# Patient Record
Sex: Male | Born: 1960 | Race: Black or African American | Hispanic: No | Marital: Married | Smoking: Never smoker
Health system: Southern US, Community
[De-identification: ages and names within clinical notes are randomized; demographics above are authoritative.]

## PROBLEM LIST (undated history)

## (undated) DIAGNOSIS — E7800 Pure hypercholesterolemia, unspecified: Secondary | ICD-10-CM

## (undated) DIAGNOSIS — E119 Type 2 diabetes mellitus without complications: Secondary | ICD-10-CM

## (undated) DIAGNOSIS — F319 Bipolar disorder, unspecified: Secondary | ICD-10-CM

## (undated) DIAGNOSIS — H409 Unspecified glaucoma: Secondary | ICD-10-CM

## (undated) DIAGNOSIS — I10 Essential (primary) hypertension: Secondary | ICD-10-CM

## (undated) DIAGNOSIS — C61 Malignant neoplasm of prostate: Secondary | ICD-10-CM

## (undated) HISTORY — PX: PROSTATE BIOPSY: SHX241

---

## 2007-05-29 ENCOUNTER — Emergency Department (HOSPITAL_COMMUNITY): Admission: EM | Admit: 2007-05-29 | Discharge: 2007-05-29 | Payer: Self-pay | Admitting: Emergency Medicine

## 2008-10-09 ENCOUNTER — Inpatient Hospital Stay: Payer: Self-pay | Admitting: Unknown Physician Specialty

## 2012-01-16 ENCOUNTER — Emergency Department (HOSPITAL_COMMUNITY)
Admission: EM | Admit: 2012-01-16 | Discharge: 2012-01-17 | Disposition: A | Discharge status: Home / Self Care | Payer: Medicare Other | Attending: Emergency Medicine | Admitting: Emergency Medicine

## 2012-01-16 ENCOUNTER — Encounter (HOSPITAL_COMMUNITY): Payer: Self-pay

## 2012-01-16 DIAGNOSIS — L251 Unspecified contact dermatitis due to drugs in contact with skin: Secondary | ICD-10-CM

## 2012-01-16 DIAGNOSIS — T498X5A Adverse effect of other topical agents, initial encounter: Secondary | ICD-10-CM | POA: Insufficient documentation

## 2012-01-16 DIAGNOSIS — T7840XA Allergy, unspecified, initial encounter: Secondary | ICD-10-CM | POA: Insufficient documentation

## 2012-01-16 DIAGNOSIS — W57XXXA Bitten or stung by nonvenomous insect and other nonvenomous arthropods, initial encounter: Secondary | ICD-10-CM

## 2012-01-16 NOTE — ED Notes (Signed)
Pt has facial swelling.

## 2012-01-16 NOTE — ED Notes (Addendum)
Pt sts 2 weeks ago went to Dr for sore on R hand and bilateral forearm bumps.  Pt was given Clotrimazole cream.  Pt sts he touched face and behind R ear with cream on hands and now is having facial swelling.  Pt denies SOB or difficulty breathing.

## 2012-01-17 MED ORDER — DIPHENHYDRAMINE HCL 50 MG/ML IJ SOLN
50.0000 mg | Freq: Once | INTRAMUSCULAR | Status: AC
  Administered 2012-01-17: 50 mg via INTRAVENOUS
  Filled 2012-01-17: qty 1

## 2012-01-17 MED ORDER — PREDNISONE 10 MG PO TABS: 20.0000 mg | ORAL_TABLET | Freq: Every day | ORAL | Status: DC

## 2012-01-17 MED ORDER — METHYLPREDNISOLONE SODIUM SUCC 125 MG IJ SOLR
125.0000 mg | Freq: Once | INTRAMUSCULAR | Status: AC
  Administered 2012-01-17: 125 mg via INTRAVENOUS
  Filled 2012-01-17: qty 2

## 2012-01-17 MED ORDER — FAMOTIDINE IN NACL 20-0.9 MG/50ML-% IV SOLN
20.0000 mg | INTRAVENOUS | Status: AC
  Administered 2012-01-17: 20 mg via INTRAVENOUS
  Filled 2012-01-17: qty 50

## 2012-01-17 NOTE — ED Notes (Signed)
Pt sts face has begun to "tingle and throb."  Pt given an ice pack.

## 2012-01-17 NOTE — ED Provider Notes (Signed)
History     CSN: [id number]  Arrival date & time 01/16/12  2328   First MD Initiated Contact with Patient 01/16/12 2338      Chief Complaint  Patient presents with  . Facial Swelling  . Allergic Reaction    (Consider location/radiation/quality/duration/timing/severity/associated sxs/prior treatment) HPI  Scott Mcgee is a 51 y.o. male who presents to the Emergency Department complaining of rash to body and itching that began after being placed on steroid cream and antibiotics for an insect bite to the top of his right hand. He rubbed the steroid cream on his arms, neck, face. Now with diffuse rash, patchy areas of weeping and extreme itching. No change to the hand lesion. Denies fever, chills, nausea, shortness of breath.     PCP [facility]   History reviewed. No pertinent past medical history.  History reviewed. No pertinent past surgical history.  History reviewed. No pertinent family history.  History  Substance Use Topics  . Smoking status: Never Smoker   . Smokeless tobacco: Never Used  . Alcohol Use: No      Review of Systems  Constitutional: Negative for fever.       10 Systems reviewed and are negative for acute change except as noted in the HPI.  HENT: Negative for congestion.   Eyes: Negative for discharge and redness.  Respiratory: Negative for cough and shortness of breath.   Cardiovascular: Negative for chest pain.  Gastrointestinal: Negative for vomiting and abdominal pain.  Musculoskeletal: Negative for back pain.  Skin: Positive for rash.       Itching, lesion to right hand  Neurological: Negative for syncope, numbness and headaches.  Psychiatric/Behavioral:       No behavior change.    Allergies  Review of patient's allergies indicates not on file.  Home Medications   Current Outpatient Rx  Name Route Sig Dispense Refill  . CEPHALEXIN 500 MG PO CAPS Oral Take 500 mg by mouth 3 (three) times daily.    Marland Kitchen CLOTRIMAZOLE 1 % EX CREA  Topical Apply topically 2 (two) times daily.    Marland Kitchen PREDNISONE 10 MG PO TABS Oral Take 2 tablets (20 mg total) by mouth daily. 10 tablet 0    BP 148/87  Pulse 68  Temp 98.2 F (36.8 C) (Oral)  Ht 5\' 6"  (1.676 m)  Wt 165 lb (74.844 kg)  BMI 26.63 kg/m2  SpO2 98%  Physical Exam  Nursing note and vitals reviewed. Constitutional: He appears well-developed and well-nourished.       Awake, alert, nontoxic appearance.  HENT:  Head: Atraumatic.  Eyes: Conjunctivae normal and EOM are normal. Pupils are equal, round, and reactive to light. Right eye exhibits no discharge. Left eye exhibits no discharge.  Neck: Normal range of motion. Neck supple.  Cardiovascular: Normal heart sounds.   Pulmonary/Chest: Effort normal and breath sounds normal. He exhibits no tenderness.  Abdominal: Soft. Bowel sounds are normal. There is no tenderness. There is no rebound.  Musculoskeletal: He exhibits no tenderness.       Baseline ROM, no obvious new focal weakness.  Neurological:       Mental status and motor strength appears baseline for patient and situation.  Skin: No rash noted.       Extensive erythematous rash to arms, neck, face, with areas of weeping , crusting, peeling. C/w contact dermatitis in various stages. Dorsum of right hand with a 2 cm circular lesion that has hyperpigmentation changes and necrotic center c/w spider bite. Mild  surrounding erythema  Psychiatric: He has a normal mood and affect.    ED Course  Procedures (including critical care time)  Labs Reviewed - No data to display No results found.   1. Allergic reaction       MDM  Patient with rash that developed after starting treatment with a steroid cream and antibiotic for an insect bite.Now with a reaction to the cream manifesting as a contact dermatitis with blisters, weeping, peeling and erythema. Initiated PO steroids, pepcid and benadryl. Referral to dermatology. Pt stable in ED with no significant deterioration in  condition.The patient appears reasonably screened and/or stabilized for discharge and I doubt any other medical condition or other [facility] requiring further screening, evaluation, or treatment in the ED at this time prior to discharge.  MDM Reviewed: nursing note and vitals           Gypsy Balsam. Olin Hauser, MD 01/17/12 [phone]

## 2012-01-22 ENCOUNTER — Other Ambulatory Visit (HOSPITAL_COMMUNITY): Payer: Self-pay | Admitting: Family Medicine

## 2012-01-22 DIAGNOSIS — IMO0002 Reserved for concepts with insufficient information to code with codable children: Secondary | ICD-10-CM

## 2012-01-26 ENCOUNTER — Ambulatory Visit (HOSPITAL_COMMUNITY)
Admission: RE | Admit: 2012-01-26 | Discharge: 2012-01-26 | Disposition: A | Discharge status: Home / Self Care | Payer: Medicare Other | Source: Ambulatory Visit | Attending: Family Medicine | Admitting: Family Medicine

## 2012-01-26 DIAGNOSIS — R748 Abnormal levels of other serum enzymes: Secondary | ICD-10-CM | POA: Insufficient documentation

## 2012-01-26 DIAGNOSIS — R935 Abnormal findings on diagnostic imaging of other abdominal regions, including retroperitoneum: Secondary | ICD-10-CM | POA: Insufficient documentation

## 2012-01-26 DIAGNOSIS — IMO0002 Reserved for concepts with insufficient information to code with codable children: Secondary | ICD-10-CM

## 2015-12-29 ENCOUNTER — Encounter (HOSPITAL_COMMUNITY): Payer: Self-pay | Admitting: Emergency Medicine

## 2015-12-29 ENCOUNTER — Emergency Department (HOSPITAL_COMMUNITY)
Admission: EM | Admit: 2015-12-29 | Discharge: 2015-12-29 | Disposition: A | Discharge status: Home / Self Care | Payer: Medicare (Managed Care) | Attending: Emergency Medicine | Admitting: Emergency Medicine

## 2015-12-29 ENCOUNTER — Emergency Department (HOSPITAL_COMMUNITY): Payer: Medicare (Managed Care)

## 2015-12-29 DIAGNOSIS — R4182 Altered mental status, unspecified: Secondary | ICD-10-CM | POA: Insufficient documentation

## 2015-12-29 DIAGNOSIS — W07XXXA Fall from chair, initial encounter: Secondary | ICD-10-CM | POA: Diagnosis not present

## 2015-12-29 DIAGNOSIS — Z23 Encounter for immunization: Secondary | ICD-10-CM | POA: Insufficient documentation

## 2015-12-29 DIAGNOSIS — F10920 Alcohol use, unspecified with intoxication, uncomplicated: Secondary | ICD-10-CM

## 2015-12-29 DIAGNOSIS — Y9389 Activity, other specified: Secondary | ICD-10-CM | POA: Diagnosis not present

## 2015-12-29 DIAGNOSIS — F10120 Alcohol abuse with intoxication, uncomplicated: Secondary | ICD-10-CM | POA: Insufficient documentation

## 2015-12-29 DIAGNOSIS — S0101XA Laceration without foreign body of scalp, initial encounter: Secondary | ICD-10-CM | POA: Diagnosis not present

## 2015-12-29 DIAGNOSIS — Y929 Unspecified place or not applicable: Secondary | ICD-10-CM | POA: Diagnosis not present

## 2015-12-29 DIAGNOSIS — Y999 Unspecified external cause status: Secondary | ICD-10-CM | POA: Diagnosis not present

## 2015-12-29 HISTORY — DX: Bipolar disorder, unspecified: F31.9

## 2015-12-29 MED ORDER — LIDOCAINE HCL (PF) 1 % IJ SOLN
5.0000 mL | Freq: Once | INTRAMUSCULAR | Status: AC
  Administered 2015-12-29: 5 mL
  Filled 2015-12-29: qty 5

## 2015-12-29 MED ORDER — TETANUS-DIPHTH-ACELL PERTUSSIS 5-2.5-18.5 LF-MCG/0.5 IM SUSP
0.5000 mL | Freq: Once | INTRAMUSCULAR | Status: AC
  Administered 2015-12-29: 0.5 mL via INTRAMUSCULAR
  Filled 2015-12-29: qty 0.5

## 2015-12-29 NOTE — ED Triage Notes (Signed)
Pt has been drinking copiously today and fell out of a chair and hit head on coffee table.  Laceration to head bandaged by ems and bleeding controlled.

## 2015-12-29 NOTE — Discharge Instructions (Signed)
Staples out in 10 days

## 2015-12-29 NOTE — ED Provider Notes (Signed)
[place] DEPT Provider Note   CSN: [id number] Arrival date & time: 12/29/15  1814     History   Chief Complaint Chief Complaint  Patient presents with  . Alcohol Intoxication  . Laceration   Level 5 caveat due to intoxication. HPI Scott Mcgee is a 55 y.o. male.  The history is provided by the patient.  Alcohol Intoxication   Laceration    Patient has reportedly been drinking today. He cannot quantify how much but states that it is his day off. He fell and hit his head. He has a laceration to the back of his head. Without other complaints. No loss of consciousness.  Past Medical History:  Diagnosis Date  . Bipolar 1 disorder ([place])     There are no active problems to display for this patient.   History reviewed. No pertinent surgical history.     Home Medications    Prior to Admission medications   Not on File    Family History History reviewed. No pertinent family history.  Social History Social History  Substance Use Topics  . Smoking status: Never Smoker  . Smokeless tobacco: Never Used  . Alcohol use Yes     Comment: every day     Allergies   Clotrimazole 3 [clotrimazole]   Review of Systems Review of Systems  Unable to perform ROS: Mental status change  Respiratory: Negative for chest tightness.      Physical Exam Updated Vital Signs BP 154/97 (BP Location: Left Arm)   Pulse (!) 121   Temp 98 F (36.7 C) (Oral)   Resp 14   Ht 5\' 6"  (1.676 m)   Wt 180 lb (81.6 kg)   SpO2 98%   BMI 29.05 kg/m   Physical Exam  Constitutional: He appears well-developed.  HENT:  3 cm horizontal laceration in right occipital area. Hematoma superiorly and laterally to this.  Eyes: EOM are normal. Pupils are equal, round, and reactive to light.  Neck: Neck supple.  Cardiovascular: Normal rate.   Pulmonary/Chest: Effort normal.  Abdominal: Soft.  Musculoskeletal: Normal range of motion.  Neurological:  Patient appears intoxicated, but is  awake and pleasant.  Skin: Skin is warm. Capillary refill takes less than 2 seconds.     ED Treatments / Results  Labs (all labs ordered are listed, but only abnormal results are displayed) Labs Reviewed - No data to display  EKG  EKG Interpretation None       Radiology Ct Head Wo Contrast  Result Date: 12/29/2015 CLINICAL DATA:  Status post fall with head injury on coffee table and laceration. EXAM: CT HEAD WITHOUT CONTRAST TECHNIQUE: Contiguous axial images were obtained from the base of the skull through the vertex without intravenous contrast. COMPARISON:  05/29/2007 CT head FINDINGS: Brain: No evidence of acute infarction, hemorrhage, hydrocephalus, extra-axial collection or mass lesion/mass effect. Vascular: No hyperdense vessel identified. Moderate internal carotid artery cavernous segment calcifications. Skull: Left occipital region soft tissue swelling with 14 mm dense nodular density probably representing a small hematoma and skin laceration. There is a 2 mm density at the myofascial boundary deep to the the laceration may represent a foreign body (series 3, image 34). No calvarial fracture is identified. Sinuses/Orbits: No acute finding. Other: None. IMPRESSION: 1. Right parietal scalp swelling with small hematoma and laceration. 2 mm density deep to the laceration may represent a foreign body. 2. No acute intracranial abnormality.  No calvarial fracture. Electronically Signed   By: Edgardo Roys.D.  On: 12/29/2015 19:32    Procedures Procedures (including critical care time)  Medications Ordered in ED Medications  Tdap (BOOSTRIX) injection 0.5 mL (0.5 mLs Intramuscular Given 12/29/15 1842)  lidocaine (PF) (XYLOCAINE) 1 % injection 5 mL (5 mLs Infiltration Given by Other 12/29/15 1843)     Initial Impression / Assessment and Plan / ED Course  I have reviewed the triage vital signs and the nursing notes.  Pertinent labs & imaging results that were available  during my care of the patient were reviewed by me and considered in my medical decision making (see chart for details).  Clinical Course    Patient with fall due to intoxication. Head CT done due to intoxication and showed no bleed. Presented with a laceration closed with staples. Discharge home with wife.  LACERATION REPAIR Performed by: Mackie Pai Authorized by: Mackie Pai Consent: Verbal consent obtained. Risks and benefits: risks, benefits and alternatives were discussed Consent given by: patient Patient identity confirmed: provided demographic data Prepped and Draped in normal sterile fashion Wound explored  Laceration Location: right posterior scalp  Laceration Length: 3cm  No Foreign Bodies seen or palpated  Anesthesia: local infiltration  Local anesthetic: lidocaine 1%   Anesthetic total: 5 ml  Skin closure: staples  Number of staples: 5  Patient tolerance: Patient tolerated the procedure well with no immediate complications.   Final Clinical Impressions(s) / ED Diagnoses   Final diagnoses:  Scalp laceration, initial encounter  Alcohol intoxication, uncomplicated ([place])    New Prescriptions New Prescriptions   No medications on file     Davonna Belling, MD 12/29/15 2020

## 2015-12-29 NOTE — ED Notes (Signed)
Redressed wound on head.

## 2016-05-07 ENCOUNTER — Emergency Department (HOSPITAL_COMMUNITY)
Admission: EM | Admit: 2016-05-07 | Discharge: 2016-05-07 | Disposition: A | Discharge status: Home / Self Care | Payer: Medicare (Managed Care) | Attending: Emergency Medicine | Admitting: Emergency Medicine

## 2016-05-07 ENCOUNTER — Emergency Department (HOSPITAL_COMMUNITY): Payer: Medicare (Managed Care)

## 2016-05-07 ENCOUNTER — Encounter (HOSPITAL_COMMUNITY): Payer: Self-pay | Admitting: Emergency Medicine

## 2016-05-07 DIAGNOSIS — S0990XA Unspecified injury of head, initial encounter: Secondary | ICD-10-CM | POA: Diagnosis present

## 2016-05-07 DIAGNOSIS — S0181XA Laceration without foreign body of other part of head, initial encounter: Secondary | ICD-10-CM | POA: Insufficient documentation

## 2016-05-07 DIAGNOSIS — Y999 Unspecified external cause status: Secondary | ICD-10-CM | POA: Insufficient documentation

## 2016-05-07 DIAGNOSIS — Y92009 Unspecified place in unspecified non-institutional (private) residence as the place of occurrence of the external cause: Secondary | ICD-10-CM | POA: Insufficient documentation

## 2016-05-07 DIAGNOSIS — W19XXXA Unspecified fall, initial encounter: Secondary | ICD-10-CM | POA: Diagnosis not present

## 2016-05-07 DIAGNOSIS — Y9389 Activity, other specified: Secondary | ICD-10-CM | POA: Insufficient documentation

## 2016-05-07 MED ORDER — LIDOCAINE-EPINEPHRINE (PF) 2 %-1:200000 IJ SOLN
20.0000 mL | Freq: Once | INTRAMUSCULAR | Status: DC
  Filled 2016-05-07: qty 20

## 2016-05-07 NOTE — ED Provider Notes (Signed)
LACERATION REPAIR OF SCALP  Patient is a 56 year old male who presents to the emergency department following a fall. He sustained a laceration to the scalp.  The procedure was explained to the patient in terms which he understands. The patient gives permission for repair of the laceration.  Patient identified by armband. Procedural timeout taken. The area was painted with Betadine. Using sterile technique the laceration area was infiltrated with 1% lidocaine with epinephrine. The wound was then irrigated with saline. No foreign body appreciated. Wound was repaired with 7 interrupted sutures of 6-0 nylon. Sterile bandage applied. Patient tolerated the procedure without problem. The wound measures 2.5 cm.   Lily Kocher, PA-C 05/07/16 2207    Pattricia Boss, MD 05/07/16 [phone]

## 2016-05-07 NOTE — ED Provider Notes (Signed)
  [place] DEPT Provider Note   CSN: [id number] Arrival date & time: 05/07/16  Scott Mcgee     History   Chief Complaint Chief Complaint  Patient presents with  . Head Injury    HPI Scott Mcgee is a 56 y.o. male.  HPI  56 year old man history of bilateral polar disorder and alcohol abuse who presents today after fall. He appears intoxicated. He has a laceration to his head. Brother is the main historian. He states that the patient's wife called him about an hour ago due to the fall.  Past Medical History:  Diagnosis Date  . Bipolar 1 disorder ([place])     There are no active problems to display for this patient.   History reviewed. No pertinent surgical history.     Home Medications    Prior to Admission medications   Not on File    Family History No family history on file.  Social History Social History  Substance Use Topics  . Smoking status: Never Smoker  . Smokeless tobacco: Never Used  . Alcohol use Yes     Comment: every day     Allergies   Clotrimazole 3 [clotrimazole]   Review of Systems Review of Systems  Unable to perform ROS: Other  patient appears intoxicated and not cooperative with examiner   Physical Exam Updated Vital Signs BP 144/77 (BP Location: Left Arm)   Pulse 101   Temp 98.2 F (36.8 C) (Oral)   Resp 16   Ht 5\' 6"  (1.676 m)   Wt 86.2 kg   SpO2 97%   BMI 30.67 kg/m   Physical Exam  Constitutional: He appears well-developed and well-nourished. No distress.  HENT:  Head:    2 cm laceration right forehead  Nursing note and vitals reviewed.    ED Treatments / Results  Labs (all labs ordered are listed, but only abnormal results are displayed) Labs Reviewed - No data to display  EKG  EKG Interpretation None       Radiology No results found.  Procedures Procedures (including critical care time)  Medications Ordered in ED Medications  lidocaine-EPINEPHrine (XYLOCAINE W/EPI) 2 %-1:200000 (PF)  injection 20 mL (not administered)     Initial Impression / Assessment and Plan / ED Course  I have reviewed the triage vital signs and the nursing notes.  Pertinent labs & imaging results that were available during my care of the patient were reviewed by me and considered in my medical decision making (see chart for details).  Clinical Course    56 year old man with alcohol abuse presents today after fall. Patient with laceration to head. Head CT and cervical spine CT are negative. Please see laceration repair note per Lily Kocher, PA Final Clinical Impressions(s) / ED Diagnoses   Final diagnoses:  Laceration of other part of head without foreign body, initial encounter    New Prescriptions New Prescriptions   No medications on file     Pattricia Boss, MD 05/07/16 2105

## 2016-05-07 NOTE — ED Triage Notes (Signed)
Pt states he fell while trying to get wood into the house. Pt has lac to the forehead. Pt is drowsy at the moment but denies any loc.

## 2016-05-07 NOTE — Discharge Instructions (Addendum)
Sutures out in 5 days. Please keep the wound clean and dry. Please see her primary physician or return to the emergency department if any signs of infection.

## 2016-05-07 NOTE — ED Notes (Signed)
ETOH use today, when asked how much pt responds with "not that much", will not specify amount. Pt began arguing with family and had to have multiple nurses in room in order for pt to sit down in bed.

## 2016-10-08 ENCOUNTER — Encounter (INDEPENDENT_AMBULATORY_CARE_PROVIDER_SITE_OTHER): Payer: Self-pay | Admitting: *Deleted

## 2016-12-30 ENCOUNTER — Other Ambulatory Visit: Payer: Self-pay | Admitting: Urology

## 2016-12-30 ENCOUNTER — Ambulatory Visit (INDEPENDENT_AMBULATORY_CARE_PROVIDER_SITE_OTHER): Payer: Medicare (Managed Care) | Admitting: Urology

## 2016-12-30 DIAGNOSIS — N5201 Erectile dysfunction due to arterial insufficiency: Secondary | ICD-10-CM

## 2016-12-30 DIAGNOSIS — R972 Elevated prostate specific antigen [PSA]: Secondary | ICD-10-CM

## 2017-02-03 ENCOUNTER — Ambulatory Visit (HOSPITAL_COMMUNITY)
Admission: RE | Admit: 2017-02-03 | Discharge: 2017-02-03 | Disposition: A | Discharge status: Home / Self Care | Payer: Medicare (Managed Care) | Source: Ambulatory Visit | Attending: Urology | Admitting: Urology

## 2017-02-03 ENCOUNTER — Encounter (HOSPITAL_COMMUNITY): Payer: Self-pay

## 2017-02-03 ENCOUNTER — Other Ambulatory Visit: Payer: Self-pay | Admitting: Urology

## 2017-02-03 DIAGNOSIS — R972 Elevated prostate specific antigen [PSA]: Secondary | ICD-10-CM

## 2017-02-03 DIAGNOSIS — C61 Malignant neoplasm of prostate: Secondary | ICD-10-CM | POA: Diagnosis not present

## 2017-02-03 MED ORDER — GENTAMICIN SULFATE 40 MG/ML IJ SOLN
80.0000 mg | Freq: Once | INTRAMUSCULAR | Status: AC
  Administered 2017-02-03: 80 mg via INTRAMUSCULAR

## 2017-02-03 MED ORDER — LIDOCAINE HCL (PF) 2 % IJ SOLN
INTRAMUSCULAR | Status: AC
  Administered 2017-02-03: 10 mL
  Filled 2017-02-03: qty 10

## 2017-02-03 MED ORDER — GENTAMICIN SULFATE 40 MG/ML IJ SOLN
INTRAMUSCULAR | Status: AC
  Administered 2017-02-03: 80 mg via INTRAMUSCULAR
  Filled 2017-02-03: qty 2

## 2017-02-03 NOTE — Discharge Instructions (Signed)
Transrectal Ultrasound-Guided Biopsy °A transrectal ultrasound-guided biopsy is a procedure to remove samples of tissue from your prostate using ultrasound images to guide the procedure. The procedure is usually done to evaluate the prostate gland of men who have an elevated prostate-specific antigen (PSA). PSA is a blood test to screen for prostate cancer. The biopsy samples are taken to check for prostate cancer. °Tell a health care provider about: °· Any allergies you have. °· All medicines you are taking, including vitamins, herbs, eye drops, creams, and over-the-counter medicines. °· Any problems you or family members have had with anesthetic medicines. °· Any blood disorders you have. °· Any surgeries you have had. °· Any medical conditions you have. °What are the risks? °Generally, this is a safe procedure. However, as with any procedure, problems can occur. Possible problems include: °· Infection of your prostate. °· Bleeding from your rectum or blood in your urine. °· Difficulty urinating. °· Nerve damage (this is usually temporary). °· Damage to surrounding structures such as blood vessels, organs, and muscles, which would require other procedures. ° °What happens before the procedure? °· Do not eat or drink anything after midnight on the night before the procedure or as directed by your health care provider. °· Take medicines only as directed by your health care provider. °· Your health care provider may have you stop taking certain medicines 5-7 days before the procedure. °· You will be given an enema before the procedure. During an enema, a liquid is injected into your rectum to clear out waste. °· You may have lab tests the day of your procedure. °· Plan to have someone take you home after the procedure. °What happens during the procedure? °· You will be given medicine to help you relax (sedative) before the procedure. An IV tube will be inserted into one of your veins and used to give fluids and  medicine. °· You will be given antibiotic medicine to reduce the risk of an infection. °· You will be placed on your side for the procedure. °· A probe with lubricated gel will be placed into your rectum, and images will be taken of your prostate and surrounding structures. °· Numbing medicine will be injected into the prostate before the biopsy samples are taken. °· A biopsy needle will then be inserted and guided to your prostate with the use of the ultrasound images. °· Samples of prostate tissue will be taken, and the needle will then be removed. °· The biopsy samples will be sent to a lab to be analyzed. Results are usually back in 2-3 days. °What happens after the procedure? °· You will be taken to a recovery area where you will be monitored. °· You may have some discomfort in the rectal area. You will be given pain medicines to control this. °· You may be allowed to go home the same day, or you may need to stay in the hospital overnight. °This information is not intended to replace advice given to you by your health care provider. Make sure you discuss any questions you have with your health care provider. °Document Released: 09/04/2013 Document Revised: 09/26/2015 Document Reviewed: 12/07/2012 °Elsevier Interactive Patient Education © 2018 [employer] ° °

## 2017-02-09 ENCOUNTER — Encounter (HOSPITAL_COMMUNITY): Payer: Self-pay | Admitting: Emergency Medicine

## 2017-02-09 ENCOUNTER — Other Ambulatory Visit (HOSPITAL_COMMUNITY)
Admission: RE | Admit: 2017-02-09 | Discharge: 2017-02-09 | Disposition: A | Discharge status: Home / Self Care | Payer: Medicare (Managed Care) | Attending: Urology | Admitting: Urology

## 2017-02-09 ENCOUNTER — Emergency Department (HOSPITAL_COMMUNITY)
Admission: EM | Admit: 2017-02-09 | Discharge: 2017-02-09 | Disposition: A | Discharge status: Home / Self Care | Payer: Medicare (Managed Care) | Attending: Emergency Medicine | Admitting: Emergency Medicine

## 2017-02-09 DIAGNOSIS — N410 Acute prostatitis: Secondary | ICD-10-CM | POA: Insufficient documentation

## 2017-02-09 DIAGNOSIS — R319 Hematuria, unspecified: Secondary | ICD-10-CM | POA: Diagnosis present

## 2017-02-09 LAB — URINALYSIS, COMPLETE (UACMP) WITH MICROSCOPIC
BILIRUBIN URINE: NEGATIVE
KETONES UR: NEGATIVE mg/dL
NITRITE: NEGATIVE
PH: 5 (ref 5.0–8.0)
Protein, ur: 100 mg/dL — AB
SPECIFIC GRAVITY, URINE: 1.012 (ref 1.005–1.030)

## 2017-02-09 MED ORDER — CIPROFLOXACIN HCL 500 MG PO TABS: 500.0000 mg | ORAL_TABLET | Freq: Two times a day (BID) | ORAL | 0 refills | Status: DC

## 2017-02-09 MED ORDER — CIPROFLOXACIN HCL 250 MG PO TABS
500.0000 mg | ORAL_TABLET | Freq: Once | ORAL | Status: AC
  Administered 2017-02-09: 500 mg via ORAL
  Filled 2017-02-09: qty 2

## 2017-02-09 NOTE — Discharge Instructions (Signed)
Cipro twice daily for 21 days See your doctor in the next  3 days for recheck ER for increased pain, fever, vomiting

## 2017-02-09 NOTE — ED Provider Notes (Signed)
[place] DEPT Provider Note   CSN: [id number] Arrival date & time: 02/09/17  1417     History   Chief Complaint Chief Complaint  Patient presents with  . Hematuria    HPI Scott Mcgee is a 56 y.o. male.  HPI  The patient is a 56 year old male, he recently underwent a prostate biopsy by Dr. Noah Delaine, he is unsure why he had this biopsy as he has not had any prior urinary symptoms but states that he had a blood test that was high. He does not have the results of that biopsy. Approximately 3 days after the biopsy he started to develop a urinary dysuria as well as a feeling of frequency. He has noticed some dark colored urine. He had some urinary testing done to today which showed too numerous to count white blood cells, too numerous to count red blood cells and some bacteria. He was nitrite negative but leukocyte large positive. The specific gravity was 1.012. He did have 500 glucose. The patient denies nausea vomiting fevers chills or back pain. He has no pelvic pain, no abdominal pain, he does have some ongoing dysuria.  Past Medical History:  Diagnosis Date  . Bipolar 1 disorder ([place])     There are no active problems to display for this patient.   No past surgical history on file.    Home Medications    Prior to Admission medications   Medication Sig Start Date End Date Taking? Authorizing Provider  ciprofloxacin (CIPRO) 500 MG tablet Take 1 tablet (500 mg total) by mouth every 12 (twelve) hours. 02/09/17   Noemi Chapel, MD    Family History No family history on file.  Social History Social History  Substance Use Topics  . Smoking status: Never Smoker  . Smokeless tobacco: Never Used  . Alcohol use No     Allergies   Clotrimazole 3 [clotrimazole]   Review of Systems Review of Systems   Physical Exam Updated Vital Signs BP (!) 162/93 (BP Location: Right Arm)   Pulse 98   Temp 98.3 F (36.8 C) (Oral)   Resp 20   Ht 5\' 6"  (1.676 m)   Wt 77.1  kg (170 lb)   SpO2 98%   BMI 27.44 kg/m   Physical Exam  Constitutional: He appears well-developed and well-nourished. No distress.  HENT:  Head: Normocephalic and atraumatic.  Mouth/Throat: Oropharynx is clear and moist. No oropharyngeal exudate.  Eyes: Pupils are equal, round, and reactive to light. Conjunctivae and EOM are normal. Right eye exhibits no discharge. Left eye exhibits no discharge. No scleral icterus.  Neck: Normal range of motion. Neck supple. No JVD present. No thyromegaly present.  Cardiovascular: Normal rate, regular rhythm, normal heart sounds and intact distal pulses.  Exam reveals no gallop and no friction rub.   No murmur heard. Pulmonary/Chest: Effort normal and breath sounds normal. No respiratory distress. He has no wheezes. He has no rales.  Abdominal: Soft. Bowel sounds are normal. He exhibits no distension and no mass. There is no tenderness.  Genitourinary:  Genitourinary Comments: Normal appearing circumcised penis, normal scrotum and testicles, no tenderness, no hernias  Musculoskeletal: Normal range of motion. He exhibits no edema or tenderness.  Lymphadenopathy:    He has no cervical adenopathy.  Neurological: He is alert. Coordination normal.  Skin: Skin is warm and dry. No rash noted. No erythema.  Psychiatric: He has a normal mood and affect. His behavior is normal.  Nursing note and vitals reviewed.  ED Treatments / Results  Labs (all labs ordered are listed, but only abnormal results are displayed) Labs Reviewed - No data to display  Radiology No results found.  Procedures Procedures (including critical care time)  Medications Ordered in ED Medications  ciprofloxacin (CIPRO) tablet 500 mg (not administered)     Initial Impression / Assessment and Plan / ED Course  I have reviewed the triage vital signs and the nursing notes.  Pertinent labs & imaging results that were available during my care of the patient were reviewed by me  and considered in my medical decision making (see chart for details).     The patient's exam is benign, his urinary testing is consistent with a urinary tract infection, due to his prostate biopsy will treat for prostatitis. Culture has been sent, will start ciprofloxacin.  Final Clinical Impressions(s) / ED Diagnoses   Final diagnoses:  Acute prostatitis    New Prescriptions New Prescriptions   CIPROFLOXACIN (CIPRO) 500 MG TABLET    Take 1 tablet (500 mg total) by mouth every 12 (twelve) hours.     Noemi Chapel, MD 02/09/17 [phone]

## 2017-02-09 NOTE — ED Triage Notes (Signed)
Pt had prostate biopsy Wednesday, pt now having painful, bloody urine

## 2017-02-12 LAB — URINE CULTURE: Culture: >=100000 — AB

## 2017-02-24 ENCOUNTER — Other Ambulatory Visit (HOSPITAL_COMMUNITY)
Admission: RE | Admit: 2017-02-24 | Discharge: 2017-02-24 | Disposition: A | Discharge status: Home / Self Care | Payer: Medicare (Managed Care) | Source: Other Acute Inpatient Hospital | Attending: Urology | Admitting: Urology

## 2017-02-24 ENCOUNTER — Ambulatory Visit (INDEPENDENT_AMBULATORY_CARE_PROVIDER_SITE_OTHER): Payer: Medicare (Managed Care) | Admitting: Urology

## 2017-02-24 DIAGNOSIS — C61 Malignant neoplasm of prostate: Secondary | ICD-10-CM

## 2017-02-26 ENCOUNTER — Encounter: Payer: Self-pay | Admitting: Radiation Oncology

## 2017-02-27 LAB — URINE CULTURE

## 2017-03-03 ENCOUNTER — Encounter (INDEPENDENT_AMBULATORY_CARE_PROVIDER_SITE_OTHER): Payer: Self-pay | Admitting: Internal Medicine

## 2017-03-05 ENCOUNTER — Emergency Department (HOSPITAL_COMMUNITY)
Admission: EM | Admit: 2017-03-05 | Discharge: 2017-03-05 | Disposition: A | Discharge status: Home / Self Care | Payer: Medicare (Managed Care) | Attending: Emergency Medicine | Admitting: Emergency Medicine

## 2017-03-05 ENCOUNTER — Other Ambulatory Visit: Payer: Self-pay | Admitting: Urology

## 2017-03-05 ENCOUNTER — Encounter (HOSPITAL_COMMUNITY): Payer: Self-pay | Admitting: *Deleted

## 2017-03-05 DIAGNOSIS — M791 Myalgia, unspecified site: Secondary | ICD-10-CM | POA: Diagnosis not present

## 2017-03-05 DIAGNOSIS — N390 Urinary tract infection, site not specified: Secondary | ICD-10-CM

## 2017-03-05 DIAGNOSIS — R350 Frequency of micturition: Secondary | ICD-10-CM | POA: Diagnosis not present

## 2017-03-05 DIAGNOSIS — Z79899 Other long term (current) drug therapy: Secondary | ICD-10-CM | POA: Diagnosis not present

## 2017-03-05 DIAGNOSIS — Z8546 Personal history of malignant neoplasm of prostate: Secondary | ICD-10-CM | POA: Diagnosis not present

## 2017-03-05 DIAGNOSIS — R300 Dysuria: Secondary | ICD-10-CM | POA: Diagnosis present

## 2017-03-05 DIAGNOSIS — C61 Malignant neoplasm of prostate: Secondary | ICD-10-CM

## 2017-03-05 LAB — URINALYSIS, ROUTINE W REFLEX MICROSCOPIC
Bilirubin Urine: NEGATIVE
Glucose, UA: NEGATIVE mg/dL
Ketones, ur: 5 mg/dL — AB
Nitrite: POSITIVE — AB
PH: 5 (ref 5.0–8.0)
Protein, ur: NEGATIVE mg/dL
SPECIFIC GRAVITY, URINE: 1.011 (ref 1.005–1.030)
Squamous Epithelial / LPF: NONE SEEN

## 2017-03-05 MED ORDER — CEPHALEXIN 500 MG PO CAPS: 500.0000 mg | ORAL_CAPSULE | Freq: Four times a day (QID) | ORAL | 0 refills | Status: DC

## 2017-03-05 MED ORDER — LIDOCAINE HCL (PF) 1 % IJ SOLN
INTRAMUSCULAR | Status: AC
  Filled 2017-03-05: qty 5

## 2017-03-05 MED ORDER — ONDANSETRON HCL 4 MG PO TABS
4.0000 mg | ORAL_TABLET | Freq: Once | ORAL | Status: AC
  Administered 2017-03-05: 4 mg via ORAL
  Filled 2017-03-05: qty 1

## 2017-03-05 MED ORDER — CEFTRIAXONE SODIUM 1 G IJ SOLR
1.0000 g | Freq: Once | INTRAMUSCULAR | Status: AC
  Administered 2017-03-05: 1 g via INTRAMUSCULAR
  Filled 2017-03-05: qty 10

## 2017-03-05 MED ORDER — CEFTRIAXONE SODIUM 1 G IJ SOLR
INTRAMUSCULAR | Status: AC
  Filled 2017-03-05: qty 10

## 2017-03-05 MED ORDER — PHENAZOPYRIDINE HCL 100 MG PO TABS
100.0000 mg | ORAL_TABLET | Freq: Once | ORAL | Status: AC
  Administered 2017-03-05: 100 mg via ORAL
  Filled 2017-03-05: qty 1

## 2017-03-05 MED ORDER — LIDOCAINE HCL (PF) 1 % IJ SOLN
INTRAMUSCULAR | Status: AC
  Administered 2017-03-05: 2.1 mL
  Filled 2017-03-05: qty 5

## 2017-03-05 MED ORDER — PHENAZOPYRIDINE HCL 100 MG PO TABS: ORAL_TABLET | ORAL | 0 refills | Status: DC

## 2017-03-05 NOTE — Discharge Instructions (Signed)
Please increase fluids. Use keflex four times daily with meals and at bedtime. See your specialist as suggested. Use pyridium three times daily with food for burning and discomfort. Your urine will turn an orange color. Use tylenol for pain or fever. See your MD or return to the ED if any changes or problem.

## 2017-03-05 NOTE — ED Notes (Signed)
Rounding apprised of wait time  Apologies for wait  Encouraged to stay for treatment

## 2017-03-05 NOTE — ED Triage Notes (Signed)
Pt had a prostate biopsy several weeks here and was treated for a "uti" per pt. Pt states his symptoms have returned but not as bad. Pt states he began feeling better so he stopped taking the meds.

## 2017-03-05 NOTE — ED Provider Notes (Signed)
[facility] EMERGENCY DEPARTMENT Provider Note   CSN: [id number] Arrival date & time: 03/05/17  1832     History   Chief Complaint Chief Complaint  Patient presents with  . Dysuria    HPI Scott Mcgee is a 56 y.o. male.  Patient is a 56 year old male who presents to the emergency department with complaint of dysuria.  The patient was seen in the emergency department on October 9 with similar symptoms.  The patient had recently had a biopsy of his prostate gland and was treated with antibiotic medications.  The patient states he only took them for short period of time because he thought he was feeling better.  He also states that he lost the prescription and did not know what the tablets were.  In the last few days the patient states he is feeling bad all over and he has burning when he urinates and a heavy sensation in his bladder.  He notices that his urine is more cloudy than usual and he is going to the bathroom more frequently than usual.  He presents now to the emergency department for evaluation.  It is of note that the biopsies of the prostate that were done in October showed evidence of cancer.      Past Medical History:  Diagnosis Date  . Bipolar 1 disorder ([place])   . Cancer [facility])    prostate cancer recently diagnosed in October 24th.    There are no active problems to display for this patient.   History reviewed. No pertinent surgical history.     Home Medications    Prior to Admission medications   Medication Sig Start Date End Date Taking? Authorizing Provider  ciprofloxacin (CIPRO) 500 MG tablet Take 1 tablet (500 mg total) by mouth every 12 (twelve) hours. 02/09/17   Noemi Chapel, MD    Family History History reviewed. No pertinent family history.  Social History Social History  Substance Use Topics  . Smoking status: Never Smoker  . Smokeless tobacco: Never Used  . Alcohol use No     Allergies   Clotrimazole 3 [clotrimazole]   Review  of Systems Review of Systems  Constitutional: Negative for activity change.       All ROS Neg except as noted in HPI  HENT: Negative for nosebleeds.   Eyes: Negative for photophobia and discharge.  Respiratory: Negative for cough, shortness of breath and wheezing.   Cardiovascular: Negative for chest pain and palpitations.  Gastrointestinal: Negative for abdominal pain and blood in stool.  Genitourinary: Positive for dysuria and frequency. Negative for hematuria, scrotal swelling and testicular pain.  Musculoskeletal: Positive for myalgias. Negative for arthralgias, back pain and neck pain.  Skin: Negative.   Neurological: Negative for dizziness, seizures and speech difficulty.  Psychiatric/Behavioral: Negative for confusion and hallucinations.     Physical Exam Updated Vital Signs BP 129/72 (BP Location: Left Arm)   Pulse 85   Temp 99.2 F (37.3 C) (Oral)   Resp 16   Ht 5\' 6"  (1.676 m)   Wt 75.8 kg (167 lb)   SpO2 96%   BMI 26.95 kg/m   Physical Exam  Constitutional: He is oriented to person, place, and time. He appears well-developed and well-nourished.  Non-toxic appearance.  HENT:  Head: Normocephalic.  Right Ear: Tympanic membrane and external ear normal.  Left Ear: Tympanic membrane and external ear normal.  Eyes: Pupils are equal, round, and reactive to light. EOM and lids are normal.  Neck: Normal range of  motion. Neck supple. Carotid bruit is not present.  Cardiovascular: Normal rate, regular rhythm, normal heart sounds, intact distal pulses and normal pulses.   Pulmonary/Chest: Breath sounds normal. No respiratory distress.  Abdominal: Soft. Bowel sounds are normal. There is no tenderness. There is no guarding.  No lower abdomen tenderness.  No suprapubic tenderness.  No CVA tenderness.  Genitourinary: Penis normal.  Genitourinary Comments: No scrotal enlargement or tenderness.  No palpable inguinal nodes appreciated.  Musculoskeletal: Normal range of motion.    Lymphadenopathy:       Head (right side): No submandibular adenopathy present.       Head (left side): No submandibular adenopathy present.    He has no cervical adenopathy.  Neurological: He is alert and oriented to person, place, and time. He has normal strength. No cranial nerve deficit or sensory deficit.  Skin: Skin is warm and dry.  Psychiatric: He has a normal mood and affect. His speech is normal.  Nursing note and vitals reviewed.    ED Treatments / Results  Labs (all labs ordered are listed, but only abnormal results are displayed) Labs Reviewed  URINALYSIS, ROUTINE W REFLEX MICROSCOPIC - Abnormal; Notable for the following:       Result Value   APPearance HAZY (*)    Hgb urine dipstick SMALL (*)    Ketones, ur 5 (*)    Nitrite POSITIVE (*)    Leukocytes, UA MODERATE (*)    Bacteria, UA FEW (*)    All other components within normal limits    EKG  EKG Interpretation None       Radiology No results found.  Procedures Procedures (including critical care time)  Medications Ordered in ED Medications  cefTRIAXone (ROCEPHIN) injection 1 g (not administered)  phenazopyridine (PYRIDIUM) tablet 100 mg (not administered)  ondansetron (ZOFRAN) tablet 4 mg (not administered)     Initial Impression / Assessment and Plan / ED Course  I have reviewed the triage vital signs and the nursing notes.  Pertinent labs & imaging results that were available during my care of the patient were reviewed by me and considered in my medical decision making (see chart for details).       Final Clinical Impressions(s) / ED Diagnoses MDM Vital signs reviewed.  I have reviewed the culture from the previous visit.  The patient is not sensitive to Cipro.  Will use Rocephin and Omnicef.  We will also use Pyridium for burning and dysuria.  Patient states he gets good relief from extra strength Tylenol.  We will encouraged patient to continue to use the extra strength Tylenol.  Patient  is scheduled for additional testing and office visit in about 2 weeks.   Final diagnoses:  Urinary tract infection without hematuria, site unspecified    New Prescriptions New Prescriptions   No medications on file     Annette Stable 03/05/17 2234    Dorie Rank, MD 03/06/17 [phone]

## 2017-03-08 LAB — URINE CULTURE: Special Requests: NORMAL

## 2017-03-09 ENCOUNTER — Telehealth: Payer: Self-pay | Admitting: *Deleted

## 2017-03-09 ENCOUNTER — Other Ambulatory Visit: Payer: Self-pay | Admitting: Urology

## 2017-03-09 DIAGNOSIS — C61 Malignant neoplasm of prostate: Secondary | ICD-10-CM

## 2017-03-09 NOTE — Telephone Encounter (Signed)
Post ED Visit - Positive Culture Follow-up  Culture report reviewed by antimicrobial stewardship pharmacist:  []  Elenor Quinones, Pharm.D. []  Heide Guile, Pharm.D., BCPS AQ-ID []  Parks Neptune, Pharm.D., BCPS []  Alycia Rossetti, Pharm.D., BCPS []  [place], Pharm.D., BCPS, AAHIVP []  Legrand Como, Pharm.D., BCPS, AAHIVP []  Salome Arnt, PharmD, BCPS []  Dimitri Ped, PharmD, BCPS []  Vincenza Hews, PharmD, BCPS Charlene Brooke, PharmD  Positive urine culture Treated with Cephalexin, organism sensitive to the same and no further patient follow-up is required at this time.  Harlon Flor [facility] 03/09/2017, 9:52 AM

## 2017-03-12 ENCOUNTER — Encounter: Payer: Self-pay | Admitting: Radiation Oncology

## 2017-03-12 NOTE — Progress Notes (Signed)
GU Location of Tumor / Histology: prostatic adenocarcinoma  If Prostate Cancer, Gleason Score is (4 + 3) and PSA is (5.8) Prostate volume: 28.4 grams  Scott Mcgee presented to his PCP requesting a full physical first of this year. Reports hesitancy and straining to void is what caused him to seek a physical initially.  Biopsies of prostate (if applicable) revealed:    Past/Anticipated interventions by urology, if any: prostate biopsy, CT of abdomen pelvis ordered (11/16), bone scan ordered (11/15), referral to Dr. Tresa Moore to discuss surgery, referral to Dr. Tammi Klippel to discuss radiation  Past/Anticipated interventions by medical oncology, if any: no  Weight changes, if any: no  Bowel/Bladder complaints, if any: difficulty emptying bladder completely, urinary frequency, intermittency, urgency, weak stream nocturia x 1. IPSS 15.  Recently completed antibiotic for UTI following prostate biopsy. Reports dysuria resolved with antibiotic treatment. Denies hematuria. Denies urinary leakage or incontinence.  Nausea/Vomiting, if any: no  Pain issues, if any:  No  SAFETY ISSUES:  Prior radiation? no  Pacemaker/ICD? no  Possible current pregnancy? no  Is the patient on methotrexate? no  Current Complaints / other details:  56 year old male. Married for 55 years with 2 sons. Lives in [place] ([place]) [place]. Scheduled to consult with Dr. Tresa Moore tomorrow to discuss prostatectomy.

## 2017-03-15 ENCOUNTER — Other Ambulatory Visit: Payer: Self-pay

## 2017-03-15 ENCOUNTER — Encounter: Payer: Self-pay | Admitting: Radiation Oncology

## 2017-03-15 ENCOUNTER — Ambulatory Visit
Admission: RE | Admit: 2017-03-15 | Discharge: 2017-03-15 | Disposition: A | Discharge status: Home / Self Care | Payer: Medicare (Managed Care) | Source: Ambulatory Visit | Attending: Radiation Oncology | Admitting: Radiation Oncology

## 2017-03-15 VITALS — Resp 18 | Ht 66.0 in | Wt 159.2 lb

## 2017-03-15 DIAGNOSIS — C61 Malignant neoplasm of prostate: Secondary | ICD-10-CM | POA: Diagnosis not present

## 2017-03-15 DIAGNOSIS — Z888 Allergy status to other drugs, medicaments and biological substances status: Secondary | ICD-10-CM | POA: Insufficient documentation

## 2017-03-15 DIAGNOSIS — Z79899 Other long term (current) drug therapy: Secondary | ICD-10-CM | POA: Diagnosis not present

## 2017-03-15 DIAGNOSIS — F319 Bipolar disorder, unspecified: Secondary | ICD-10-CM | POA: Diagnosis not present

## 2017-03-15 DIAGNOSIS — Z7984 Long term (current) use of oral hypoglycemic drugs: Secondary | ICD-10-CM | POA: Insufficient documentation

## 2017-03-15 HISTORY — DX: Malignant neoplasm of prostate: C61

## 2017-03-15 NOTE — Progress Notes (Signed)
Radiation Oncology         [phone] ________________________________  Initial Outpatient Consultation  Name: Scott Mcgee MRN: [id number]  Date: 03/15/2017  DOB: [birth year]  CC:[facility]  McKenzie, Candee Furbish, MD   REFERRING PHYSICIAN: Cleon Gustin, MD  DIAGNOSIS: 56 y.o. 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 Malignant neoplasm of prostate ([place]) C61     HISTORY OF PRESENT ILLNESS: Scott Mcgee is a 57 y.o. male with a diagnosis of prostate cancer. He initially presented to his primary care provider, Neysa Hotter, PA, requesting a full physical exam at the first of this year. At that time he reported urinary hesitancy and straining to void which is what caused him to seek a physical in the first place.  He was noted to have an elevated PSA of 5.8. He has since taken Flomax for relief of his urinary symptoms. The patient proceeded to transrectal ultrasound with 12 biopsies of the prostate on 02/03/2017.  The prostate volume measured 28.4 cc. A digital rectal examination was performed at that time revealing no nodules. Out of 12 core biopsies, 8 were positive.  The maximum Gleason score was 4+3, and this was seen in the left mid and right base. 3+4 was seen in the right base lateral. 3+3 was seen in the the left apex lateral, left base, left apex, right mid, and right apex.  Biopsies of prostate (if applicable) revealed:    Accordingly, he was referred for evaluation in urology by Dr. Nicolette Bang on 02/24/2017 who ordered CT Abdomen/Pelvis and bone scans, scheduled for later this week. The patient reviewed the biopsy results with his urologist, and he has been referred to Dr. Tresa Moore for consideration of prostatectomy as well as to Dr. Tammi Klippel today for discussion of potential radiation treatment options. He is accompanied by his wife.   PREVIOUS  RADIATION THERAPY: No  PAST MEDICAL HISTORY:  Past Medical History:  Diagnosis Date  . Bipolar 1 disorder ([place])   . Prostate cancer ([place])       PAST SURGICAL HISTORY: Past Surgical History:  Procedure Laterality Date  . PROSTATE BIOPSY      FAMILY HISTORY:  Family History  Problem Relation Age of Onset  . Cancer Mother        GYN problems    SOCIAL HISTORY:  Social History   Socioeconomic History  . Marital status: Married    Spouse name: Jeanett Schlein   . Number of children: 2  . Years of education: Not on file  . Highest education level: Not on file  Social Needs  . Financial resource strain: Not on file  . Food insecurity - worry: Not on file  . Food insecurity - inability: Not on file  . Transportation needs - medical: Not on file  . Transportation needs - non-medical: Not on file  Occupational History  . Not on file  Tobacco Use  . Smoking status: Never Smoker  . Smokeless tobacco: Never Used  Substance and Sexual Activity  . Alcohol use: No  . Drug use: No  . Sexual activity: Not on file  Other Topics Concern  . Not on file  Social History Narrative  . Not on file   He is married and resides in [place], [place].  ALLERGIES: Clotrimazole 3 [clotrimazole]  MEDICATIONS:  Current Outpatient Medications  Medication Sig Dispense Refill  .  atorvastatin (LIPITOR) 20 MG tablet     . hydrochlorothiazide (HYDRODIURIL) 25 MG tablet     . metFORMIN (GLUCOPHAGE) 500 MG tablet     . tamsulosin (FLOMAX) 0.4 MG CAPS capsule      No current facility-administered medications for this encounter.     REVIEW OF SYSTEMS:  On review of systems, the patient reports that he is doing well overall. He denies any chest pain, shortness of breath, cough, fevers, chills, night sweats, or unintended weight changes. He denies any bowel disturbances, and denies abdominal pain, nausea or vomiting. He denies any new musculoskeletal or joint aches or pains. His IPSS was 15, indicating moderate  urinary symptoms, including difficulty emptying bladder completely, frequency, intermittency, urgency, weak stream, and nocturia x1. He reports he has recently completed an antibiotic for UTI following prostate biopsy. He states his dysuria resolved with the antibiotic treatment. He denies hematuria, leakage or incontinence. He is able to complete sexual activity with some attempts. A complete review of systems is obtained and is otherwise negative.    PHYSICAL EXAM:  Wt Readings from Last 3 Encounters:  03/15/17 159 lb 3.2 oz (72.2 kg)  03/05/17 167 lb (75.8 kg)  02/09/17 170 lb (77.1 kg)   Temp Readings from Last 3 Encounters:  03/05/17 99.2 F (37.3 C) (Oral)  02/09/17 98.3 F (36.8 C) (Oral)  02/03/17 97.9 F (36.6 C) (Oral)   BP Readings from Last 3 Encounters:  03/05/17 129/72  02/09/17 (!) 158/87  02/03/17 (!) 170/80   Pulse Readings from Last 3 Encounters:  03/05/17 85  02/09/17 100  02/03/17 90    In general this is a well appearing African-American man in no acute distress. He's alert and oriented x4 and appropriate throughout the examination. Cardiopulmonary assessment is negative for acute distress and he exhibits normal effort.    KPS = 100  100 - Normal; no complaints; no evidence of disease. 90   - Able to carry on normal activity; minor signs or symptoms of disease. 80   - Normal activity with effort; some signs or symptoms of disease. 38   - Cares for self; unable to carry on normal activity or to do active work. 60   - Requires occasional assistance, but is able to care for most of his personal needs. 50   - Requires considerable assistance and frequent medical care. 57   - Disabled; requires special care and assistance. 58   - Severely disabled; hospital admission is indicated although death not imminent. 49   - Very sick; hospital admission necessary; active supportive treatment necessary. 10   - Moribund; fatal processes progressing rapidly. 0     -  Dead  Karnofsky DA, Abelmann WH, Craver LS and Burchenal JH [phone]) The use of the nitrogen mustards in the palliative treatment of carcinoma: with particular reference to bronchogenic carcinoma Cancer 1 [phone]  LABORATORY DATA:  No results found for: WBC, HGB, HCT, MCV, PLT No results found for: NA, K, CL, CO2 No results found for: ALT, AST, GGT, ALKPHOS, BILITOT   RADIOGRAPHY: No results found.    IMPRESSION/PLAN: 1. 56 y.o. 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 Dr. Tammi Klippel discusses the pathology findings and reviews the nature of intermittent risk prostate cancer. He is proceeding with staging studies later this week at [facility]. We discussed the options for prostatectomy versus radiotherapy either with external radiation versus brachytherapy. We discussed the risks,  benefits, short, and long term effects of radiotherapy. At the end of the discussion the patient verbalizes interest in prostatectomy. We will see him back in the future as needed, and enjoyed meeting him. We will pass along this information to Dr. Tresa Moore.   In a visit lasting 60 minutes, greater than 50% of the time was spent face to face discussing options of treatment pertaining to his situation, and coordinating the patient's care.    Carola Rhine, PAC    Tyler Pita, MD  [facility] Direct Dial: [phone]  Fax: [phone] [email or web address]  Skype  [email or web address]    Page Me      This document serves as a record of services personally performed by Tyler Pita, MD and Shona Simpson, PA-C. It was created on their behalf by Rae Lips, a trained medical scribe. The creation of this record is based on the scribe's personal observations and the providers' statements to them. This document has been checked and approved by the attending providers.

## 2017-03-15 NOTE — Progress Notes (Signed)
See progress note under physician encounter. 

## 2017-03-18 ENCOUNTER — Encounter (HOSPITAL_COMMUNITY): Payer: Medicare (Managed Care)

## 2017-03-18 ENCOUNTER — Other Ambulatory Visit: Payer: Self-pay | Admitting: Urology

## 2017-03-19 ENCOUNTER — Ambulatory Visit (HOSPITAL_COMMUNITY): Payer: Medicare (Managed Care)

## 2017-03-22 DIAGNOSIS — C61 Malignant neoplasm of prostate: Secondary | ICD-10-CM | POA: Insufficient documentation

## 2017-03-26 ENCOUNTER — Telehealth: Payer: Self-pay | Admitting: Medical Oncology

## 2017-03-26 NOTE — Telephone Encounter (Signed)
I called Scott Mcgee to follow up post radiation consult with Dr. Tammi Klippel. He has chosen to have robotic prostatectomy 05/19/16. I wish him well and if I can be of help to him I asked that he call.

## 2017-05-14 NOTE — Patient Instructions (Addendum)
DELMOS VELAQUEZ  05/14/2017   Your procedure is scheduled on: 05-19-17  Report to [facility] Main  Entrance    Report to admitting at 10:45AM   Call this number if you have problems the morning of surgery [phone]     Remember: Do not eat food After Midnight on Monday 05-17-17 . Drink plenty of clear liquids all day Tuesday 05-18-17 and follow all bowel prep instruction provided by your surgeon. Nothing by mouth after midnight on Tuesday !     Take these medicines the morning of surgery with A SIP OF WATER: atorvastatin                                You may not have any metal on your body including hair pins and              piercings  Do not wear jewelry, make-up, lotions, powders or perfumes, deodorant                Men may shave face and neck.   Do not bring valuables to the hospital. [place].  Contacts, dentures or bridgework may not be worn into surgery.  Leave suitcase in the car. After surgery it may be brought to your room.               Please read over the following fact sheets you were given: _____________________________________________________________________    CLEAR LIQUID DIET   Foods Allowed                                                                     Foods Excluded  Coffee and tea, regular and decaf                             liquids that you cannot  Plain Jell-O in any flavor                                             see through such as: Fruit ices (not with fruit pulp)                                     milk, soups, orange juice  Iced Popsicles                                    All solid food Carbonated beverages, regular and diet                                    Cranberry, grape and apple juices Sports drinks like Gatorade Lightly seasoned clear  broth or consume(fat free) Sugar, honey syrup  Sample Menu Breakfast                                Lunch                                      Supper Cranberry juice                    Beef broth                            Chicken broth Jell-O                                     Grape juice                           Apple juice Coffee or tea                        Jell-O                                      Popsicle                                                Coffee or tea                        Coffee or tea  _____________________________________________________________________              [facility] - Preparing for Surgery Before surgery, you can play an important role.  Because skin is not sterile, your skin needs to be as free of germs as possible.  You can reduce the number of germs on your skin by washing with CHG (chlorahexidine gluconate) soap before surgery.  CHG is an antiseptic cleaner which kills germs and bonds with the skin to continue killing germs even after washing. Please DO NOT use if you have an allergy to CHG or antibacterial soaps.  If your skin becomes reddened/irritated stop using the CHG and inform your nurse when you arrive at Short Stay. Do not shave (including legs and underarms) for at least 48 hours prior to the first CHG shower.  You may shave your face/neck. Please follow these instructions carefully:  1.  Shower with CHG Soap the night before surgery and the  morning of Surgery.  2.  If you choose to wash your hair, wash your hair first as usual with your  normal  shampoo.  3.  After you shampoo, rinse your hair and body thoroughly to remove the  shampoo.                           4.  Use CHG as you would any other liquid soap.  You can apply chg directly  to the skin and wash  Gently with a scrungie or clean washcloth.  5.  Apply the CHG Soap to your body ONLY FROM THE NECK DOWN.   Do not use on face/ open                           Wound or open sores. Avoid contact with eyes, ears mouth and genitals (private parts).                       Wash  face,  Genitals (private parts) with your normal soap.             6.  Wash thoroughly, paying special attention to the area where your surgery  will be performed.  7.  Thoroughly rinse your body with warm water from the neck down.  8.  DO NOT shower/wash with your normal soap after using and rinsing off  the CHG Soap.                9.  Pat yourself dry with a clean towel.            10.  Wear clean pajamas.            11.  Place clean sheets on your bed the night of your first shower and do not  sleep with pets. Day of Surgery : Do not apply any lotions/deodorants the morning of surgery.  Please wear clean clothes to the hospital/surgery center.  FAILURE TO FOLLOW THESE INSTRUCTIONS MAY RESULT IN THE CANCELLATION OF YOUR SURGERY PATIENT SIGNATURE_________________________________  NURSE SIGNATURE__________________________________  ________________________________________________________________________    How to Manage Your Diabetes Before and After Surgery  Why is it important to control my blood sugar before and after surgery? . Improving blood sugar levels before and after surgery helps healing and can limit problems. . A way of improving blood sugar control is eating a healthy diet by: o  Eating less sugar and carbohydrates o  Increasing activity/exercise o  Talking with your doctor about reaching your blood sugar goals . High blood sugars (greater than 180 mg/dL) can raise your risk of infections and slow your recovery, so you will need to focus on controlling your diabetes during the weeks before surgery. . Make sure that the doctor who takes care of your diabetes knows about your planned surgery including the date and location.  How do I manage my blood sugar before surgery? . Check your blood sugar at least 4 times a day, starting 2 days before surgery, to make sure that the level is not too high or low. o Check your blood sugar the morning of your surgery when you wake up  and every 2 hours until you get to the Short Stay unit. . If your blood sugar is less than 70 mg/dL, you will need to treat for low blood sugar: o Do not take insulin. o Treat a low blood sugar (less than 70 mg/dL) with  cup of clear juice (cranberry or apple), 4 glucose tablets, OR glucose gel. o Recheck blood sugar in 15 minutes after treatment (to make sure it is greater than 70 mg/dL). If your blood sugar is not greater than 70 mg/dL on recheck, call [phone] for further instructions. . Report your blood sugar to the short stay nurse when you get to Short Stay.  . If you are admitted to the hospital after surgery: o Your blood sugar will be checked by the staff and  you will probably be given insulin after surgery (instead of oral diabetes medicines) to make sure you have good blood sugar levels. o The goal for blood sugar control after surgery is 80-180 mg/dL.   WHAT DO I DO ABOUT MY DIABETES MEDICATION?  Marland Kitchen Do not take oral diabetes medicines (pills) the morning of surgery.  . THE DAY BEFORE SURGERY, take METFORMIN as normal       . THE MORNING OF SURGERY, DO NOT TAKE METFORMIN!   Patient Signature:  Date:   Nurse Signature:  Date:   Reviewed and Endorsed by [facility] Patient Education Committee, August 2015

## 2017-05-17 ENCOUNTER — Encounter (HOSPITAL_COMMUNITY): Payer: Self-pay

## 2017-05-17 ENCOUNTER — Other Ambulatory Visit: Payer: Self-pay

## 2017-05-17 ENCOUNTER — Encounter (INDEPENDENT_AMBULATORY_CARE_PROVIDER_SITE_OTHER): Payer: Self-pay

## 2017-05-17 ENCOUNTER — Encounter (HOSPITAL_COMMUNITY)
Admission: RE | Admit: 2017-05-17 | Discharge: 2017-05-17 | Disposition: A | Discharge status: Home / Self Care | Payer: Medicare (Managed Care) | Source: Ambulatory Visit | Attending: Urology | Admitting: Urology

## 2017-05-17 DIAGNOSIS — Z01812 Encounter for preprocedural laboratory examination: Secondary | ICD-10-CM | POA: Diagnosis not present

## 2017-05-17 DIAGNOSIS — I10 Essential (primary) hypertension: Secondary | ICD-10-CM | POA: Insufficient documentation

## 2017-05-17 DIAGNOSIS — C61 Malignant neoplasm of prostate: Secondary | ICD-10-CM | POA: Diagnosis not present

## 2017-05-17 DIAGNOSIS — E119 Type 2 diabetes mellitus without complications: Secondary | ICD-10-CM | POA: Insufficient documentation

## 2017-05-17 DIAGNOSIS — Z01810 Encounter for preprocedural cardiovascular examination: Secondary | ICD-10-CM | POA: Insufficient documentation

## 2017-05-17 HISTORY — DX: Unspecified glaucoma: H40.9

## 2017-05-17 HISTORY — DX: Essential (primary) hypertension: I10

## 2017-05-17 HISTORY — DX: Pure hypercholesterolemia, unspecified: E78.00

## 2017-05-17 HISTORY — DX: Type 2 diabetes mellitus without complications: E11.9

## 2017-05-17 LAB — BASIC METABOLIC PANEL
Anion gap: 9 (ref 5–15)
BUN: 13 mg/dL (ref 6–20)
CHLORIDE: 96 mmol/L — AB (ref 101–111)
CO2: 29 mmol/L (ref 22–32)
CREATININE: 1.27 mg/dL — AB (ref 0.61–1.24)
Calcium: 10.5 mg/dL — ABNORMAL HIGH (ref 8.9–10.3)
Glucose, Bld: 180 mg/dL — ABNORMAL HIGH (ref 65–99)
POTASSIUM: 4.8 mmol/L (ref 3.5–5.1)
SODIUM: 134 mmol/L — AB (ref 135–145)

## 2017-05-17 LAB — GLUCOSE, CAPILLARY: GLUCOSE-CAPILLARY: 186 mg/dL — AB (ref 65–99)

## 2017-05-17 LAB — CBC
HEMATOCRIT: 39.7 % (ref 39.0–52.0)
Hemoglobin: 13.5 g/dL (ref 13.0–17.0)
MCH: 29.3 pg (ref 26.0–34.0)
MCHC: 34 g/dL (ref 30.0–36.0)
MCV: 86.1 fL (ref 78.0–100.0)
PLATELETS: 287 10*3/uL (ref 150–400)
RBC: 4.61 MIL/uL (ref 4.22–5.81)
RDW: 13.6 % (ref 11.5–15.5)
WBC: 6.6 10*3/uL (ref 4.0–10.5)

## 2017-05-17 LAB — HEMOGLOBIN A1C
Hgb A1c MFr Bld: 6.4 % — ABNORMAL HIGH (ref 4.8–5.6)
MEAN PLASMA GLUCOSE: 136.98 mg/dL

## 2017-05-19 ENCOUNTER — Encounter (HOSPITAL_COMMUNITY): Admission: RE | Payer: Self-pay | Source: Ambulatory Visit

## 2017-05-19 ENCOUNTER — Inpatient Hospital Stay (HOSPITAL_COMMUNITY): Admission: RE | Admit: 2017-05-19 | Payer: Medicare (Managed Care) | Source: Ambulatory Visit | Admitting: Urology

## 2017-05-19 SURGERY — PROSTATECTOMY, RADICAL, ROBOT-ASSISTED, LAPAROSCOPIC
Anesthesia: General

## 2017-05-27 ENCOUNTER — Other Ambulatory Visit: Payer: Self-pay | Admitting: Urology

## 2017-06-11 NOTE — Progress Notes (Signed)
EKG 05-17-17 Epic  HEMAGLOBIN A1C 05-17-17 EPIC

## 2017-06-11 NOTE — Patient Instructions (Addendum)
Scott Mcgee  06/11/2017   Your procedure is scheduled on: Wednesday 06-16-17  Report to [facility] Main  Entrance  Report to admitting at 1000 AM  Call this number if you have problems the morning of surgery [phone]   Remember: Do not eat food:After Midnight, Monday NIGHT CLEAR LIQUIDS ALL DAY Tuesday 06-15-17, DRINK 1 BOTTLE OF MAGNESIUM CITRATE BY NOON ON Tuesday 06-15-17 PER DR Lengby, NO CLEAR LIQUIDS AFTER MIDNIGHT Tuesday NIGHT.       CLEAR LIQUID DIET   Foods Allowed                                                                     Foods Excluded  Coffee and tea, regular and decaf                             liquids that you cannot  Plain Jell-O in any flavor                                             see through such as: Fruit ices (not with fruit pulp)                                     milk, soups, orange juice  Iced Popsicles                                    All solid food Carbonated beverages, regular and diet                                    Cranberry, grape and apple juices Sports drinks like Gatorade Lightly seasoned clear broth or consume(fat free) Sugar, honey syrup  Sample Menu Breakfast                                Lunch                                     Supper Cranberry juice                    Beef broth                            Chicken broth Jell-O                                     Grape juice  Apple juice Coffee or tea                        Jell-O                                      Popsicle                                                Coffee or tea                        Coffee or tea  _____________________________________________________________________     Take these medicines the morning of surgery with A SIP OF WATER: ATORVASTATIN, AMLODIPINE DO NOT TAKE ANY DIABETIC MEDICATIONS DAY OF YOUR SURGERY              How to Manage Your Diabetes Before and  After Surgery  Why is it important to control my blood sugar before and after surgery? . Improving blood sugar levels before and after surgery helps healing and can limit problems. . A way of improving blood sugar control is eating a healthy diet by: o  Eating less sugar and carbohydrates o  Increasing activity/exercise o  Talking with your doctor about reaching your blood sugar goals . High blood sugars (greater than 180 mg/dL) can raise your risk of infections and slow your recovery, so you will need to focus on controlling your diabetes during the weeks before surgery. . Make sure that the doctor who takes care of your diabetes knows about your planned surgery including the date and location.  How do I manage my blood sugar before surgery? . Check your blood sugar at least 4 times a day, starting 2 days before surgery, to make sure that the level is not too high or low. o Check your blood sugar the morning of your surgery when you wake up and every 2 hours until you get to the Short Stay unit. . If your blood sugar is less than 70 mg/dL, you will need to treat for low blood sugar: o Do not take insulin. o Treat a low blood sugar (less than 70 mg/dL) with  cup of clear juice (cranberry or apple), 4 glucose tablets, OR glucose gel. o Recheck blood sugar in 15 minutes after treatment (to make sure it is greater than 70 mg/dL). If your blood sugar is not greater than 70 mg/dL on recheck, call [phone] for further instructions. . Report your blood sugar to the short stay nurse when you get to Short Stay.  . If you are admitted to the hospital after surgery: o Your blood sugar will be checked by the staff and you will probably be given insulin after surgery (instead of oral diabetes medicines) to make sure you have good blood sugar levels. o The goal for blood sugar control after surgery is 80-180 mg/dL.   WHAT DO I DO ABOUT MY DIABETES MEDICATION?  Marland Kitchen Do not take oral diabetes medicines  (pills) the morning of surgery.  . THE DAY  BEFORE SURGERY TUESDAY 06-15-17 TAKE YOUR METFORMIN AS USUAL.      . THE MORNING OF SURGERY Wednesday 06-16-17 DO NOT TAKE YOUR METFORMIN.    [facility] - Preparing for  Surgery Before surgery, you can play an important role.  Because skin is not sterile, your skin needs to be as free of germs as possible.  You can reduce the number of germs on your skin by washing with CHG (chlorahexidine gluconate) soap before surgery.  CHG is an antiseptic cleaner which kills germs and bonds with the skin to continue killing germs even after washing. Please DO NOT use if you have an allergy to CHG or antibacterial soaps.  If your skin becomes reddened/irritated stop using the CHG and inform your nurse when you arrive at Short Stay. Do not shave (including legs and underarms) for at least 48 hours prior to the first CHG shower.  You may shave your face/neck. Please follow these instructions carefully:  1.  Shower with CHG Soap the night before surgery and the  morning of Surgery.  2.  If you choose to wash your hair, wash your hair first as usual with your  normal  shampoo.  3.  After you shampoo, rinse your hair and body thoroughly to remove the  shampoo.                           4.  Use CHG as you would any other liquid soap.  You can apply chg directly  to the skin and wash                       Gently with a scrungie or clean washcloth.  5.  Apply the CHG Soap to your body ONLY FROM THE NECK DOWN.   Do not use on face/ open                           Wound or open sores. Avoid contact with eyes, ears mouth and genitals (private parts).                       Wash face,  Genitals (private parts) with your normal soap.             6.  Wash thoroughly, paying special attention to the area where your surgery  will be performed.  7.  Thoroughly rinse your body with warm water from the neck down.  8.  DO NOT shower/wash with your normal soap after using and rinsing off   the CHG Soap.                9.  Pat yourself dry with a clean towel.            10.  Wear clean pajamas.            11.  Place clean sheets on your bed the night of your first shower and do not  sleep with pets. Day of Surgery : Do not apply any lotions/deodorants the morning of surgery.  Please wear clean clothes to the hospital/surgery center.  FAILURE TO FOLLOW THESE INSTRUCTIONS MAY RESULT IN THE CANCELLATION OF YOUR SURGERY PATIENT SIGNATURE_________________________________  NURSE SIGNATURE__________________________________  ________________________________________________________________________                  Dennis Bast may not have any metal on your body including hair pins and              piercings  Do not wear jewelry, make-up, lotions, powders or perfumes, deodorant  Do not wear nail polish.  Do not shave  48 hours prior to surgery.              Men may shave face and neck.   Do not bring valuables to the hospital. [place].  Contacts, dentures or bridgework may not be worn into surgery.  Leave suitcase in the car. After surgery it may be brought to your room.     Patients discharged the day of surgery will not be allowed to drive home.  Name and phone number of your driver:  Special Instructions: N/A              Please read over the following fact sheets you were given: _____________________________________________________________________

## 2017-06-14 ENCOUNTER — Other Ambulatory Visit (HOSPITAL_COMMUNITY): Payer: Self-pay | Admitting: *Deleted

## 2017-06-14 ENCOUNTER — Encounter (HOSPITAL_COMMUNITY)
Admission: RE | Admit: 2017-06-14 | Discharge: 2017-06-14 | Disposition: A | Discharge status: Home / Self Care | Payer: Medicare (Managed Care) | Source: Ambulatory Visit | Attending: Urology | Admitting: Urology

## 2017-06-14 ENCOUNTER — Encounter (HOSPITAL_COMMUNITY): Payer: Self-pay

## 2017-06-14 ENCOUNTER — Other Ambulatory Visit: Payer: Self-pay

## 2017-06-14 DIAGNOSIS — E119 Type 2 diabetes mellitus without complications: Secondary | ICD-10-CM | POA: Diagnosis not present

## 2017-06-14 DIAGNOSIS — Z79899 Other long term (current) drug therapy: Secondary | ICD-10-CM | POA: Diagnosis not present

## 2017-06-14 DIAGNOSIS — Z7984 Long term (current) use of oral hypoglycemic drugs: Secondary | ICD-10-CM | POA: Diagnosis not present

## 2017-06-14 DIAGNOSIS — I10 Essential (primary) hypertension: Secondary | ICD-10-CM | POA: Diagnosis not present

## 2017-06-14 DIAGNOSIS — E7800 Pure hypercholesterolemia, unspecified: Secondary | ICD-10-CM | POA: Diagnosis not present

## 2017-06-14 DIAGNOSIS — C61 Malignant neoplasm of prostate: Secondary | ICD-10-CM | POA: Diagnosis present

## 2017-06-14 LAB — CBC
HCT: 40.5 % (ref 39.0–52.0)
Hemoglobin: 14 g/dL (ref 13.0–17.0)
MCH: 29.4 pg (ref 26.0–34.0)
MCHC: 34.6 g/dL (ref 30.0–36.0)
MCV: 84.9 fL (ref 78.0–100.0)
Platelets: 332 10*3/uL (ref 150–400)
RBC: 4.77 MIL/uL (ref 4.22–5.81)
RDW: 12.9 % (ref 11.5–15.5)
WBC: 6.5 10*3/uL (ref 4.0–10.5)

## 2017-06-14 LAB — BASIC METABOLIC PANEL
Anion gap: 14 (ref 5–15)
BUN: 20 mg/dL (ref 6–20)
CO2: 26 mmol/L (ref 22–32)
Calcium: 10 mg/dL (ref 8.9–10.3)
Chloride: 93 mmol/L — ABNORMAL LOW (ref 101–111)
Creatinine, Ser: 1.38 mg/dL — ABNORMAL HIGH (ref 0.61–1.24)
GFR calc Af Amer: >60 mL/min (ref >60)
GFR calc non Af Amer: 54 mL/min — ABNORMAL LOW (ref >60)
Glucose, Bld: 124 mg/dL — ABNORMAL HIGH (ref 65–99)
Potassium: 3.4 mmol/L — ABNORMAL LOW (ref 3.5–5.1)
Sodium: 133 mmol/L — ABNORMAL LOW (ref 135–145)

## 2017-06-14 LAB — ABO/RH: ABO/RH(D): AB POS

## 2017-06-14 LAB — GLUCOSE, CAPILLARY: Glucose-Capillary: 134 mg/dL — ABNORMAL HIGH (ref 65–99)

## 2017-06-16 ENCOUNTER — Ambulatory Visit (HOSPITAL_COMMUNITY): Payer: Medicare (Managed Care) | Admitting: Anesthesiology

## 2017-06-16 ENCOUNTER — Encounter (HOSPITAL_COMMUNITY)
Admission: RE | Disposition: A | Discharge status: Home / Self Care | Payer: Self-pay | Source: Ambulatory Visit | Attending: Urology

## 2017-06-16 ENCOUNTER — Observation Stay (HOSPITAL_COMMUNITY)
Admission: RE | Admit: 2017-06-16 | Discharge: 2017-06-17 | Disposition: A | Discharge status: Home / Self Care | Payer: Medicare (Managed Care) | Source: Ambulatory Visit | Attending: Urology | Admitting: Urology

## 2017-06-16 ENCOUNTER — Encounter (HOSPITAL_COMMUNITY): Payer: Self-pay

## 2017-06-16 DIAGNOSIS — C61 Malignant neoplasm of prostate: Secondary | ICD-10-CM | POA: Diagnosis not present

## 2017-06-16 DIAGNOSIS — E119 Type 2 diabetes mellitus without complications: Secondary | ICD-10-CM | POA: Insufficient documentation

## 2017-06-16 DIAGNOSIS — E7800 Pure hypercholesterolemia, unspecified: Secondary | ICD-10-CM | POA: Diagnosis not present

## 2017-06-16 DIAGNOSIS — I10 Essential (primary) hypertension: Secondary | ICD-10-CM | POA: Diagnosis not present

## 2017-06-16 DIAGNOSIS — Z7984 Long term (current) use of oral hypoglycemic drugs: Secondary | ICD-10-CM | POA: Insufficient documentation

## 2017-06-16 DIAGNOSIS — Z79899 Other long term (current) drug therapy: Secondary | ICD-10-CM | POA: Insufficient documentation

## 2017-06-16 HISTORY — PX: LYMPHADENECTOMY: SHX5960

## 2017-06-16 HISTORY — PX: ROBOT ASSISTED LAPAROSCOPIC RADICAL PROSTATECTOMY: SHX5141

## 2017-06-16 LAB — TYPE AND SCREEN
ABO/RH(D): AB POS
Antibody Screen: NEGATIVE

## 2017-06-16 LAB — HEMOGLOBIN AND HEMATOCRIT, BLOOD
HCT: 35.2 % — ABNORMAL LOW (ref 39.0–52.0)
Hemoglobin: 12 g/dL — ABNORMAL LOW (ref 13.0–17.0)

## 2017-06-16 LAB — GLUCOSE, CAPILLARY
GLUCOSE-CAPILLARY: 198 mg/dL — AB (ref 65–99)
GLUCOSE-CAPILLARY: 241 mg/dL — AB (ref 65–99)
GLUCOSE-CAPILLARY: 223 mg/dL — AB (ref 65–99)
Glucose-Capillary: 158 mg/dL — ABNORMAL HIGH (ref 65–99)

## 2017-06-16 SURGERY — ROBOTIC ASSISTED LAPAROSCOPIC RADICAL PROSTATECTOMY
Anesthesia: General

## 2017-06-16 MED ORDER — ROCURONIUM BROMIDE 10 MG/ML (PF) SYRINGE
PREFILLED_SYRINGE | INTRAVENOUS | Status: DC | PRN
  Administered 2017-06-16: 10 mg via INTRAVENOUS
  Administered 2017-06-16: 50 mg via INTRAVENOUS

## 2017-06-16 MED ORDER — METFORMIN HCL 500 MG PO TABS
500.0000 mg | ORAL_TABLET | Freq: Every day | ORAL | Status: DC
  Administered 2017-06-17: 500 mg via ORAL
  Filled 2017-06-16: qty 1

## 2017-06-16 MED ORDER — LACTATED RINGERS IR SOLN
Status: DC | PRN
  Administered 2017-06-16: 1000 mL

## 2017-06-16 MED ORDER — FENTANYL CITRATE (PF) 100 MCG/2ML IJ SOLN
INTRAMUSCULAR | Status: DC | PRN
  Administered 2017-06-16 (×3): 50 ug via INTRAVENOUS

## 2017-06-16 MED ORDER — OXYCODONE HCL 5 MG PO TABS: 5.0000 mg | ORAL_TABLET | Freq: Once | ORAL | Status: DC | PRN

## 2017-06-16 MED ORDER — KETOROLAC TROMETHAMINE 30 MG/ML IJ SOLN
30.0000 mg | Freq: Once | INTRAMUSCULAR | Status: DC | PRN
Start: 2017-06-16 — End: 2017-06-16

## 2017-06-16 MED ORDER — INSULIN ASPART 100 UNIT/ML ~~LOC~~ SOLN
0.0000 [IU] | Freq: Three times a day (TID) | SUBCUTANEOUS | Status: DC
  Administered 2017-06-17 (×2): 2 [IU] via SUBCUTANEOUS

## 2017-06-16 MED ORDER — ROCURONIUM BROMIDE 10 MG/ML (PF) SYRINGE
PREFILLED_SYRINGE | INTRAVENOUS | Status: AC
  Filled 2017-06-16: qty 5

## 2017-06-16 MED ORDER — ACETAMINOPHEN 500 MG PO TABS
1000.0000 mg | ORAL_TABLET | Freq: Four times a day (QID) | ORAL | Status: AC
  Administered 2017-06-16 – 2017-06-17 (×4): 1000 mg via ORAL
  Filled 2017-06-16 (×4): qty 2

## 2017-06-16 MED ORDER — CEFAZOLIN SODIUM-DEXTROSE 2-4 GM/100ML-% IV SOLN: 2.0000 g | INTRAVENOUS | Status: DC

## 2017-06-16 MED ORDER — FENTANYL CITRATE (PF) 100 MCG/2ML IJ SOLN
INTRAMUSCULAR | Status: AC
  Filled 2017-06-16: qty 2

## 2017-06-16 MED ORDER — OXYCODONE HCL 5 MG/5ML PO SOLN
5.0000 mg | Freq: Once | ORAL | Status: DC | PRN
  Filled 2017-06-16: qty 5

## 2017-06-16 MED ORDER — SODIUM CHLORIDE 0.45 % IV SOLN
INTRAVENOUS | Status: DC
  Administered 2017-06-16: 18:00:00 via INTRAVENOUS

## 2017-06-16 MED ORDER — HYDROMORPHONE HCL 1 MG/ML IJ SOLN
INTRAMUSCULAR | Status: AC
  Filled 2017-06-16: qty 1

## 2017-06-16 MED ORDER — LIDOCAINE 2% (20 MG/ML) 5 ML SYRINGE
INTRAMUSCULAR | Status: DC | PRN
  Administered 2017-06-16: 100 mg via INTRAVENOUS

## 2017-06-16 MED ORDER — SUCCINYLCHOLINE CHLORIDE 200 MG/10ML IV SOSY
PREFILLED_SYRINGE | INTRAVENOUS | Status: AC
  Filled 2017-06-16: qty 10

## 2017-06-16 MED ORDER — SUGAMMADEX SODIUM 200 MG/2ML IV SOLN
INTRAVENOUS | Status: AC
  Filled 2017-06-16: qty 2

## 2017-06-16 MED ORDER — SODIUM CHLORIDE 0.9 % IV BOLUS (SEPSIS)
1000.0000 mL | Freq: Once | INTRAVENOUS | Status: AC
  Administered 2017-06-16: 1000 mL via INTRAVENOUS

## 2017-06-16 MED ORDER — PROPOFOL 10 MG/ML IV BOLUS
INTRAVENOUS | Status: DC | PRN
  Administered 2017-06-16: 200 mg via INTRAVENOUS

## 2017-06-16 MED ORDER — LACTATED RINGERS IV SOLN
INTRAVENOUS | Status: DC
  Administered 2017-06-16 (×2): via INTRAVENOUS

## 2017-06-16 MED ORDER — ONDANSETRON HCL 4 MG/2ML IJ SOLN: 4.0000 mg | INTRAMUSCULAR | Status: DC | PRN

## 2017-06-16 MED ORDER — HYDROMORPHONE HCL 1 MG/ML IJ SOLN
0.2500 mg | INTRAMUSCULAR | Status: DC | PRN
  Administered 2017-06-16 (×3): 0.5 mg via INTRAVENOUS

## 2017-06-16 MED ORDER — MAGNESIUM CITRATE PO SOLN: 1.0000 | Freq: Once | ORAL | Status: DC

## 2017-06-16 MED ORDER — PROMETHAZINE HCL 25 MG/ML IJ SOLN: 6.2500 mg | INTRAMUSCULAR | Status: DC | PRN

## 2017-06-16 MED ORDER — PROPOFOL 10 MG/ML IV BOLUS
INTRAVENOUS | Status: AC
  Filled 2017-06-16: qty 20

## 2017-06-16 MED ORDER — DIPHENHYDRAMINE HCL 12.5 MG/5ML PO ELIX: 12.5000 mg | ORAL_SOLUTION | Freq: Four times a day (QID) | ORAL | Status: DC | PRN

## 2017-06-16 MED ORDER — ONDANSETRON HCL 4 MG/2ML IJ SOLN
INTRAMUSCULAR | Status: DC | PRN
  Administered 2017-06-16: 4 mg via INTRAVENOUS

## 2017-06-16 MED ORDER — HYDROMORPHONE HCL 1 MG/ML IJ SOLN
0.5000 mg | INTRAMUSCULAR | Status: DC | PRN
  Administered 2017-06-16: 1 mg via INTRAVENOUS
  Filled 2017-06-16 (×2): qty 1

## 2017-06-16 MED ORDER — SULFAMETHOXAZOLE-TRIMETHOPRIM 800-160 MG PO TABS: 1.0000 | ORAL_TABLET | Freq: Two times a day (BID) | ORAL | 0 refills | Status: DC

## 2017-06-16 MED ORDER — SUCCINYLCHOLINE CHLORIDE 200 MG/10ML IV SOSY
PREFILLED_SYRINGE | INTRAVENOUS | Status: DC | PRN
  Administered 2017-06-16: 100 mg via INTRAVENOUS

## 2017-06-16 MED ORDER — MIDAZOLAM HCL 2 MG/2ML IJ SOLN
INTRAMUSCULAR | Status: AC
  Filled 2017-06-16: qty 2

## 2017-06-16 MED ORDER — ATORVASTATIN CALCIUM 20 MG PO TABS
20.0000 mg | ORAL_TABLET | Freq: Every evening | ORAL | Status: DC
  Administered 2017-06-16: 20 mg via ORAL
  Filled 2017-06-16: qty 1

## 2017-06-16 MED ORDER — SODIUM CHLORIDE 0.9 % IJ SOLN
INTRAMUSCULAR | Status: AC
  Filled 2017-06-16: qty 20

## 2017-06-16 MED ORDER — LIDOCAINE 2% (20 MG/ML) 5 ML SYRINGE
INTRAMUSCULAR | Status: AC
  Filled 2017-06-16: qty 5

## 2017-06-16 MED ORDER — CEFAZOLIN SODIUM-DEXTROSE 2-4 GM/100ML-% IV SOLN
2.0000 g | INTRAVENOUS | Status: AC
  Administered 2017-06-16: 2 g via INTRAVENOUS
  Filled 2017-06-16: qty 100

## 2017-06-16 MED ORDER — MIDAZOLAM HCL 5 MG/5ML IJ SOLN
INTRAMUSCULAR | Status: DC | PRN
  Administered 2017-06-16: 2 mg via INTRAVENOUS

## 2017-06-16 MED ORDER — HYDROCHLOROTHIAZIDE 25 MG PO TABS
25.0000 mg | ORAL_TABLET | Freq: Every day | ORAL | Status: DC
  Administered 2017-06-17: 25 mg via ORAL
  Filled 2017-06-16: qty 1

## 2017-06-16 MED ORDER — BUPIVACAINE LIPOSOME 1.3 % IJ SUSP
20.0000 mL | Freq: Once | INTRAMUSCULAR | Status: DC
  Filled 2017-06-16: qty 20

## 2017-06-16 MED ORDER — OXYCODONE HCL 5 MG PO TABS
5.0000 mg | ORAL_TABLET | ORAL | Status: DC | PRN
  Administered 2017-06-16 – 2017-06-17 (×3): 5 mg via ORAL
  Filled 2017-06-16 (×3): qty 1

## 2017-06-16 MED ORDER — SODIUM CHLORIDE 0.9 % IJ SOLN
INTRAMUSCULAR | Status: DC | PRN
  Administered 2017-06-16: 20 mL

## 2017-06-16 MED ORDER — DIPHENHYDRAMINE HCL 50 MG/ML IJ SOLN: 12.5000 mg | Freq: Four times a day (QID) | INTRAMUSCULAR | Status: DC | PRN

## 2017-06-16 MED ORDER — AMLODIPINE BESYLATE 10 MG PO TABS
10.0000 mg | ORAL_TABLET | ORAL | Status: DC
  Administered 2017-06-17: 10 mg via ORAL
  Filled 2017-06-16: qty 1

## 2017-06-16 MED ORDER — BUPIVACAINE LIPOSOME 1.3 % IJ SUSP
INTRAMUSCULAR | Status: DC | PRN
  Administered 2017-06-16: 20 mL

## 2017-06-16 MED ORDER — HYDROCODONE-ACETAMINOPHEN 5-325 MG PO TABS: 1.0000 | ORAL_TABLET | Freq: Four times a day (QID) | ORAL | 0 refills | Status: DC | PRN

## 2017-06-16 SURGICAL SUPPLY — 70 items
APPLICATOR COTTON TIP 6IN STRL (MISCELLANEOUS) ×4 IMPLANT
BAG URO CATCHER STRL LF (MISCELLANEOUS) IMPLANT
CATH FOLEY 2WAY SLVR 18FR 30CC (CATHETERS) ×4 IMPLANT
CATH TIEMANN FOLEY 18FR 5CC (CATHETERS) ×4 IMPLANT
CHLORAPREP W/TINT 26ML (MISCELLANEOUS) ×4 IMPLANT
CLIP VESOLOCK LG 6/CT PURPLE (CLIP) ×12 IMPLANT
CLOTH BEACON ORANGE TIMEOUT ST (SAFETY) ×4 IMPLANT
CONT SPEC 4OZ CLIKSEAL STRL BL (MISCELLANEOUS) ×4 IMPLANT
COVER FOOTSWITCH UNIV (MISCELLANEOUS) IMPLANT
COVER SURGICAL LIGHT HANDLE (MISCELLANEOUS) ×4 IMPLANT
COVER TIP SHEARS 8 DVNC (MISCELLANEOUS) ×2 IMPLANT
COVER TIP SHEARS 8MM DA VINCI (MISCELLANEOUS) ×2
CUTTER ECHEON FLEX ENDO 45 340 (ENDOMECHANICALS) ×4 IMPLANT
DECANTER SPIKE VIAL GLASS SM (MISCELLANEOUS) IMPLANT
DERMABOND ADVANCED (GAUZE/BANDAGES/DRESSINGS) ×2
DERMABOND ADVANCED .7 DNX12 (GAUZE/BANDAGES/DRESSINGS) ×2 IMPLANT
DRAPE ARM DVNC X/XI (DISPOSABLE) ×8 IMPLANT
DRAPE COLUMN DVNC XI (DISPOSABLE) ×2 IMPLANT
DRAPE DA VINCI XI ARM (DISPOSABLE) ×8
DRAPE DA VINCI XI COLUMN (DISPOSABLE) ×2
DRAPE SURG IRRIG POUCH 19X23 (DRAPES) ×4 IMPLANT
DRSG TEGADERM 4X4.75 (GAUZE/BANDAGES/DRESSINGS) ×4 IMPLANT
ELECT PENCIL ROCKER SW 15FT (MISCELLANEOUS) ×4 IMPLANT
ELECT REM PT RETURN 15FT ADLT (MISCELLANEOUS) ×4 IMPLANT
GAUZE SPONGE 2X2 8PLY STRL LF (GAUZE/BANDAGES/DRESSINGS) ×2 IMPLANT
GLOVE BIO SURGEON STRL SZ 6.5 (GLOVE) ×3 IMPLANT
GLOVE BIO SURGEONS STRL SZ 6.5 (GLOVE) ×1
GLOVE BIOGEL M STRL SZ7.5 (GLOVE) ×8 IMPLANT
GLOVE BIOGEL PI IND STRL 7.5 (GLOVE) ×2 IMPLANT
GLOVE BIOGEL PI INDICATOR 7.5 (GLOVE) ×2
GOWN STRL REUS W/TWL LRG LVL3 (GOWN DISPOSABLE) ×12 IMPLANT
GOWN STRL REUS W/TWL XL LVL3 (GOWN DISPOSABLE) ×4 IMPLANT
HOLDER FOLEY CATH W/STRAP (MISCELLANEOUS) ×4 IMPLANT
IRRIG SUCT STRYKERFLOW 2 WTIP (MISCELLANEOUS) ×4
IRRIGATION SUCT STRKRFLW 2 WTP (MISCELLANEOUS) ×2 IMPLANT
IV LACTATED RINGERS 1000ML (IV SOLUTION) IMPLANT
KIT PROCEDURE DA VINCI SI (MISCELLANEOUS) ×2
KIT PROCEDURE DVNC SI (MISCELLANEOUS) ×2 IMPLANT
MANIFOLD NEPTUNE II (INSTRUMENTS) ×4 IMPLANT
NEEDLE INSUFFLATION 14GA 120MM (NEEDLE) ×4 IMPLANT
NEEDLE SPNL 22GX7 QUINCKE BK (NEEDLE) ×4 IMPLANT
PACK CYSTO (CUSTOM PROCEDURE TRAY) ×4 IMPLANT
PACK ROBOT UROLOGY CUSTOM (CUSTOM PROCEDURE TRAY) ×4 IMPLANT
PAD POSITIONING PINK XL (MISCELLANEOUS) IMPLANT
PORT ACCESS TROCAR AIRSEAL 12 (TROCAR) ×2 IMPLANT
PORT ACCESS TROCAR AIRSEAL 5M (TROCAR) ×2
SEAL CANN UNIV 5-8 DVNC XI (MISCELLANEOUS) ×8 IMPLANT
SEAL XI 5MM-8MM UNIVERSAL (MISCELLANEOUS) ×8
SET TRI-LUMEN FLTR TB AIRSEAL (TUBING) ×4 IMPLANT
SOLUTION ELECTROLUBE (MISCELLANEOUS) ×4 IMPLANT
SPONGE GAUZE 2X2 STER 10/PKG (GAUZE/BANDAGES/DRESSINGS) ×2
SPONGE LAP 4X18 X RAY DECT (DISPOSABLE) ×4 IMPLANT
STAPLE RELOAD 45 GRN (STAPLE) ×2 IMPLANT
STAPLE RELOAD 45MM GREEN (STAPLE) ×2
SUT ETHILON 3 0 PS 1 (SUTURE) ×4 IMPLANT
SUT MNCRL AB 4-0 PS2 18 (SUTURE) ×8 IMPLANT
SUT PDS AB 1 CT1 27 (SUTURE) ×8 IMPLANT
SUT VIC AB 2-0 SH 27 (SUTURE) ×2
SUT VIC AB 2-0 SH 27X BRD (SUTURE) ×2 IMPLANT
SUT VICRYL 0 UR6 27IN ABS (SUTURE) ×4 IMPLANT
SUT VLOC BARB 180 ABS3/0GR12 (SUTURE) ×12
SUTURE VLOC BRB 180 ABS3/0GR12 (SUTURE) ×6 IMPLANT
SYR 27GX1/2 1ML LL SAFETY (SYRINGE) ×4 IMPLANT
SYR CONTROL 10ML LL (SYRINGE) IMPLANT
TOWEL OR 17X26 10 PK STRL BLUE (TOWEL DISPOSABLE) ×4 IMPLANT
TOWEL OR NON WOVEN STRL DISP B (DISPOSABLE) ×4 IMPLANT
TUBING CONNECTING 10 (TUBING) IMPLANT
TUBING CONNECTING 10' (TUBING)
WATER STERILE IRR 1000ML POUR (IV SOLUTION) IMPLANT
WATER STERILE IRR 3000ML UROMA (IV SOLUTION) IMPLANT

## 2017-06-16 NOTE — Brief Op Note (Signed)
06/16/2017  2:04 PM  PATIENT:  Linus Galas  57 y.o. male  PRE-OPERATIVE DIAGNOSIS:  PROSTATE CANCER  POST-OPERATIVE DIAGNOSIS:  PROSTATE CANCER  PROCEDURE:  Procedure(s): ROBOTIC ASSISTED LAPAROSCOPIC RADICAL PROSTATECTOMY (N/A) LYMPHADENECTOMY (Bilateral)  SURGEON:  Surgeon(s) and Role:    * Alexis Frock, MD - Primary  PHYSICIAN ASSISTANT:   ASSISTANTS: Debbrah Alar PA   ANESTHESIA:   local and general  EBL:  100 mL   BLOOD ADMINISTERED:none  DRAINS: 1 - JP to bulb, 2 -foley to gravity   LOCAL MEDICATIONS USED:  MARCAINE     SPECIMEN:  Source of Specimen:  1 -prostatectomy, 2 - pelvic lymph nodes, 3 - periprostatic fat  DISPOSITION OF SPECIMEN:  PATHOLOGY  COUNTS:  YES  TOURNIQUET:  * No tourniquets in log *  DICTATION: .Other Dictation: Dictation Number  [id number]  PLAN OF CARE: Admit for overnight observation  PATIENT DISPOSITION:  PACU - hemodynamically stable.   Delay start of Pharmacological VTE agent (>24hrs) due to surgical blood loss or risk of bleeding: yes

## 2017-06-16 NOTE — Anesthesia Postprocedure Evaluation (Signed)
Anesthesia Post Note  Patient: TRIP CAVANAGH  Procedure(s) Performed: ROBOTIC ASSISTED LAPAROSCOPIC RADICAL PROSTATECTOMY (N/A ) LYMPHADENECTOMY (Bilateral )     Patient location during evaluation: PACU Anesthesia Type: General Level of consciousness: awake and alert, oriented, awake and patient cooperative Pain management: pain level controlled Vital Signs Assessment: post-procedure vital signs reviewed and stable Respiratory status: spontaneous breathing, nonlabored ventilation, respiratory function stable and patient connected to nasal cannula oxygen Cardiovascular status: blood pressure returned to baseline and stable Postop Assessment: no apparent nausea or vomiting Anesthetic complications: no    Last Vitals:  Vitals:   06/16/17 1630 06/16/17 1645  BP: 138/68 130/64  Pulse: 85 85  Resp: 13 17  Temp:    SpO2: 100% 100%    Last Pain:  Vitals:   06/16/17 1645  TempSrc:   PainSc: Asleep                 Catalina Gravel

## 2017-06-16 NOTE — H&P (Signed)
Scott Mcgee is an 57 y.o. male.    Chief Complaint: Pre-op Prostatectomy  HPI:   1 - Moderate Risk Prostate Cancer - 8 cores with up to 20% of Mix of Gleason 6 up to 4+3=7 on evaluation PSA 5.8 02/2017. TRUS 64m without median lobe. All mid cores positive.   PMH sig for DM2 (no neuropathy), obesity. NO ischmeic CV disease / blood thinners. His PCP is Scott Mcgee.   Today "Scott Mcgee" is seen to proceed with prostatectomy.    Past Medical History:  Diagnosis Date  . Bipolar 1 disorder ([place]    NO CURRENT MEDS  . Diabetes [place] without complication ([place]    type 2   . Elevated cholesterol   . Glaucoma    LEFT EYE  . Hypertension   . Prostate cancer ([facility]     Past Surgical History:  Procedure Laterality Date  . PROSTATE BIOPSY      Family History  Problem Relation Age of Onset  . Cancer Mother        GYN problems   Social History:  reports that  has never smoked. he has never used smokeless tobacco. He reports that he does not drink alcohol or use drugs.  Allergies:  Allergies  Allergen Reactions  . Clotrimazole 3 [Clotrimazole] Hives    cream    No medications prior to admission.    Results for orders placed or performed during the hospital encounter of 06/14/17 (from the past 48 hour(s))  Glucose, capillary     Status: Abnormal   Collection Time: 06/14/17 10:50 AM  Result Value Ref Range   Glucose-Capillary 134 (H) 65 - 99 mg/dL  ABO/Rh     Status: None   Collection Time: 06/14/17 11:10 AM  Result Value Ref Range   ABO/RH(D)      AB POS Performed at [facility] [address] [place] [address]  Basic metabolic panel     Status: Abnormal   Collection Time: 06/14/17 11:11 AM  Result Value Ref Range   Sodium 133 (L) 135 - 145 mmol/L   Potassium 3.4 (L) 3.5 - 5.1 mmol/L   Chloride 93 (L) 101 - 111 mmol/L   CO2 26 22 - 32 mmol/L   Glucose, Bld 124 (H) 65 - 99 mg/dL   BUN 20 6 - 20 mg/dL   Creatinine, Ser 1.38 (H)  0.61 - 1.24 mg/dL   Calcium 10.0 8.9 - 10.3 mg/dL   GFR calc non Af Amer 54 (L) >60 mL/min   GFR calc Af Amer >60 >60 mL/min    Comment: (NOTE) The eGFR has been calculated using the CKD EPI equation. This calculation has not been validated in all clinical situations. eGFR's persistently <60 mL/min signify possible Chronic Kidney Disease.    Anion gap 14 5 - 15    Comment: Performed at [facility] [address] [place] [id number] CBC     Status: None   Collection Time: 06/14/17 11:11 AM  Result Value Ref Range   WBC 6.5 4.0 - 10.5 K/uL   RBC 4.77 4.22 - 5.81 MIL/uL   Hemoglobin 14.0 13.0 - 17.0 g/dL   HCT 40.5 39.0 - 52.0 %   MCV 84.9 78.0 - 100.0 fL   MCH 29.4 26.0 - 34.0 pg   MCHC 34.6 30.0 - 36.0 g/dL   RDW 12.9 11.5 - 15.5 %   Platelets 332 150 - 400 K/uL    Comment:  Performed at [facility], [place] [address], [place], [place] [address]  Type and screen [place]     Status: None   Collection Time: 06/14/17 11:11 AM  Result Value Ref Range   ABO/RH(D) AB POS    Antibody Screen NEG    Sample Expiration 06/19/2017    Extend sample reason      NO TRANSFUSIONS OR PREGNANCY IN THE PAST 3 MONTHS Performed at [facility], [place] [address], [place], [place] [address]    No results found.  Review of Systems  Constitutional: Negative.  Negative for chills and fever.  HENT: Negative.   Eyes: Negative.   Respiratory: Negative.   Cardiovascular: Negative.   Gastrointestinal: Negative.   Genitourinary: Negative.   Musculoskeletal: Negative.   Skin: Negative.   Neurological: Negative.   Endo/Heme/Allergies: Negative.   Psychiatric/Behavioral: Negative.     There were no vitals taken for this visit. Physical Exam  Constitutional: He appears well-developed.  HENT:  Head: Normocephalic.  Eyes: Pupils are equal, round, and reactive to light.  Neck: Normal range of motion.   Cardiovascular: Normal rate.  Respiratory: Effort normal.  GI: Soft.  Genitourinary:  Genitourinary Comments: NO CVAT  Musculoskeletal: Normal range of motion.  Neurological: He is alert.  Skin: Skin is warm.  Psychiatric: He has a normal mood and affect.     Assessment/Plan  1 - Moderate Risk Prostate Cancer - proceed as planned today with robotic prostatectomy + ICG + node dissection. Risks, benefits, alternatives, expected peri-op course discussed previously and reiterated today.    Scott Frock, MD 06/16/2017, 8:32 AM

## 2017-06-16 NOTE — Discharge Instructions (Signed)

## 2017-06-16 NOTE — Anesthesia Preprocedure Evaluation (Addendum)
Anesthesia Evaluation  Patient identified by MRN, date of birth, ID band Patient awake    Reviewed: Allergy & Precautions, NPO status , Patient's Chart, lab work & pertinent test results  Airway Mallampati: II  TM Distance: >3 FB Neck ROM: Full    Dental no notable dental hx.    Pulmonary neg pulmonary ROS,    Pulmonary exam normal breath sounds clear to auscultation       Cardiovascular hypertension, Normal cardiovascular exam Rhythm:Regular Rate:Normal     Neuro/Psych Bipolar Disorder negative neurological ROS     GI/Hepatic negative GI ROS, (+)     substance abuse  alcohol use,   Endo/Other  diabetes  Renal/GU negative Renal ROS  negative genitourinary   Musculoskeletal negative musculoskeletal ROS (+)   Abdominal   Peds negative pediatric ROS (+)  Hematology negative hematology ROS (+)   Anesthesia Other Findings   Reproductive/Obstetrics negative OB ROS                            Anesthesia Physical Anesthesia Plan  ASA: II  Anesthesia Plan: General   Post-op Pain Management:    Induction: Intravenous  PONV Risk Score and Plan: 3 and Ondansetron, Treatment may vary due to age or medical condition and Midazolam  Airway Management Planned:   Additional Equipment:   Intra-op Plan:   Post-operative Plan: Extubation in OR  Informed Consent: I have reviewed the patients History and Physical, chart, labs and discussed the procedure including the risks, benefits and alternatives for the proposed anesthesia with the patient or authorized representative who has indicated his/her understanding and acceptance.   Dental advisory given  Plan Discussed with: CRNA and Surgeon  Anesthesia Plan Comments:         Anesthesia Quick Evaluation

## 2017-06-16 NOTE — Transfer of Care (Signed)
Immediate Anesthesia Transfer of Care Note  Patient: Scott Mcgee  Procedure(s) Performed: ROBOTIC ASSISTED LAPAROSCOPIC RADICAL PROSTATECTOMY (N/A ) LYMPHADENECTOMY (Bilateral )  Patient Location: PACU  Anesthesia Type:General  Level of Consciousness: sedated  Airway & Oxygen Therapy: Patient Spontanous Breathing and Patient connected to face mask oxygen  Post-op Assessment: Report given to RN and Post -op Vital signs reviewed and stable  Post vital signs: Reviewed and stable  Last Vitals:  Vitals:   06/16/17 1024  BP: (!) 149/81  Pulse: 86  Resp: 18  Temp: 36.7 C  SpO2: 100%    Last Pain:  Vitals:   06/16/17 1024  TempSrc: Oral         Complications: No apparent anesthesia complications

## 2017-06-16 NOTE — Anesthesia Procedure Notes (Signed)
Procedure Name: Intubation Date/Time: 06/16/2017 11:40 AM Performed by: Lind Covert, CRNA Pre-anesthesia Checklist: Patient identified, Emergency Drugs available, Suction available and Patient being monitored Patient Re-evaluated:Patient Re-evaluated prior to induction Oxygen Delivery Method: Circle system utilized Preoxygenation: Pre-oxygenation with 100% oxygen Induction Type: IV induction Ventilation: Mask ventilation without difficulty Laryngoscope Size: Miller and 2 Grade View: Grade I Tube type: Oral Tube size: 7.5 mm Number of attempts: 1 Airway Equipment and Method: Stylet Placement Confirmation: ETT inserted through vocal cords under direct vision and positive ETCO2 Secured at: 22 cm Tube secured with: Tape Dental Injury: Teeth and Oropharynx as per pre-operative assessment

## 2017-06-17 ENCOUNTER — Encounter (HOSPITAL_COMMUNITY): Payer: Self-pay | Admitting: Urology

## 2017-06-17 DIAGNOSIS — C61 Malignant neoplasm of prostate: Secondary | ICD-10-CM | POA: Diagnosis not present

## 2017-06-17 LAB — BASIC METABOLIC PANEL
Anion gap: 10 (ref 5–15)
BUN: 11 mg/dL (ref 6–20)
CHLORIDE: 97 mmol/L — AB (ref 101–111)
CO2: 28 mmol/L (ref 22–32)
CREATININE: 1 mg/dL (ref 0.61–1.24)
Calcium: 8.5 mg/dL — ABNORMAL LOW (ref 8.9–10.3)
GFR calc Af Amer: >60 mL/min (ref >60)
GFR calc non Af Amer: >60 mL/min (ref >60)
GLUCOSE: 108 mg/dL — AB (ref 65–99)
Potassium: 3.4 mmol/L — ABNORMAL LOW (ref 3.5–5.1)
SODIUM: 135 mmol/L (ref 135–145)

## 2017-06-17 LAB — GLUCOSE, CAPILLARY
GLUCOSE-CAPILLARY: 149 mg/dL — AB (ref 65–99)
Glucose-Capillary: 133 mg/dL — ABNORMAL HIGH (ref 65–99)

## 2017-06-17 LAB — HEMOGLOBIN AND HEMATOCRIT, BLOOD
HCT: 29.4 % — ABNORMAL LOW (ref 39.0–52.0)
HEMOGLOBIN: 10.1 g/dL — AB (ref 13.0–17.0)

## 2017-06-17 LAB — CREATININE, FLUID (PLEURAL, PERITONEAL, JP DRAINAGE): Creat, Fluid: 1 mg/dL

## 2017-06-17 MED ORDER — SENNOSIDES-DOCUSATE SODIUM 8.6-50 MG PO TABS: 1.0000 | ORAL_TABLET | Freq: Two times a day (BID) | ORAL | 0 refills | Status: AC

## 2017-06-17 MED ORDER — BISACODYL 10 MG RE SUPP
10.0000 mg | Freq: Every day | RECTAL | Status: DC | PRN
  Administered 2017-06-17: 10 mg via RECTAL
  Filled 2017-06-17: qty 1

## 2017-06-17 NOTE — Plan of Care (Signed)
  Progressing Health Behavior/Discharge Planning: Ability to manage health-related needs will improve 06/17/2017 0810 - Progressing by Kenzie Flakes, Royetta Crochet, RN Clinical Measurements: Ability to maintain clinical measurements within normal limits will improve 06/17/2017 0810 - Progressing by Tonantzin Mimnaugh, Royetta Crochet, RN Will remain free from infection 06/17/2017 0810 - Progressing by Nila Winker, Royetta Crochet, RN Diagnostic test results will improve 06/17/2017 0810 - Progressing by Christie Copley, Royetta Crochet, RN Respiratory complications will improve 06/17/2017 0810 - Progressing by Kyliee Ortego, Royetta Crochet, RN Cardiovascular complication will be avoided 06/17/2017 0810 - Progressing by Chandell Attridge, Royetta Crochet, RN Activity: Risk for activity intolerance will decrease 06/17/2017 0810 - Progressing by Bryann Gentz, Royetta Crochet, RN Nutrition: Adequate nutrition will be maintained 06/17/2017 0810 - Progressing by Jajaira Ruis, Royetta Crochet, RN Coping: Level of anxiety will decrease 06/17/2017 0810 - Progressing by Hendryx Ricke, Royetta Crochet, RN Elimination: Will not experience complications related to bowel motility 06/17/2017 0810 - Progressing by Keyshaun Exley, Royetta Crochet, RN Will not experience complications related to urinary retention 06/17/2017 0810 - Progressing by Mckynleigh Mussell, Royetta Crochet, RN Pain Managment: General experience of comfort will improve 06/17/2017 0810 - Progressing by Azalya Galyon, Royetta Crochet, RN Safety: Ability to remain free from injury will improve 06/17/2017 0810 - Progressing by Paylin Hailu, Royetta Crochet, RN Skin Integrity: Risk for impaired skin integrity will decrease 06/17/2017 0810 - Progressing by Burnis Kaser, Royetta Crochet, RN Education: Knowledge of the procedure and recovery process will improve 06/17/2017 0810 - Progressing by Davell Beckstead, Royetta Crochet, RN Bowel/Gastric: Gastrointestinal status for postoperative course will improve 06/17/2017 0810 - Progressing by Ezrie Bunyan, Royetta Crochet, RN Pain Management: General experience of comfort will improve 06/17/2017 0810 - Progressing by Christien Frankl, Royetta Crochet, RN Skin Integrity: Demonstration of  wound healing without infection will improve 06/17/2017 0810 - Progressing by Hiroko Tregre, Royetta Crochet, RN Urinary Elimination: Ability to avoid or minimize complications of infection will improve 06/17/2017 0810 - Progressing by Aaryn Parrilla, Royetta Crochet, RN Ability to achieve and maintain urine output will improve 06/17/2017 0810 - Progressing by Jerrye Seebeck, Royetta Crochet, RN Home care management will improve 06/17/2017 0810 - Progressing by Anvi Mangal, Royetta Crochet, RN

## 2017-06-17 NOTE — Op Note (Signed)
NAME:  Scott Mcgee, Scott Mcgee NO.:  MEDICAL RECORD NO.:  [id number]  LOCATION:                                 FACILITY:  PHYSICIAN:  Alexis Frock, MD     DATE OF BIRTH:  09/04/1960  DATE OF PROCEDURE: 06/16/2017                               OPERATIVE REPORT   DIAGNOSIS:  Large-volume moderate-risk prostate cancer.  PROCEDURES: 1. Robotic-assisted laparoscopic radical prostatectomy. 2. Pelvic lymphadenectomy, robotic. 3. Injection of indocyanine green dye for sentinel lymphangiography.  ESTIMATED BLOOD LOSS:  100 cc.  COMPLICATION:  None.  SPECIMENS: 1. Right external iliac lymph nodes. 2. Right obturator lymph nodes. 3. Left external iliac lymph nodes. 4. Left obturator lymph nodes. 5. Left perivesical lymph nodes, sentinel. 6. Periprostatic fat. 7. Radical prostatectomy.  ASSISTANT:  Debbrah Alar, PA  FINDINGS:  Sentinel lymphatic channel seen coursing over the left side of the prostate and bladder.  There was a single dominant left perivesical sentinel lymph node.  INDICATION:  Scott Mcgee is a pleasant 57 year old gentleman, who was found on workup of elevated PSA, did have relatively large-volume adenocarcinoma of the prostate with 8/10 cores.  He is quite young and not comorbid.  Options were discussed for management including primary surgery versus ablative therapies versus surveillance protocols and he wished to proceed with prostatectomy with curative intent.  Informed consent was obtained and placed in the medical record.  PROCEDURE IN DETAIL:  The patient being Scott Mcgee, was verified.  Procedure being radical prostatectomy was confirmed. Procedure was carried out.  Time-out was performed.  Intravenous antibiotics were administered.  General endotracheal anesthesia was introduced.  The patient was placed into a low lithotomy position. Sterile field was created by prepping and draping the patient's penis, perineum and  proximal thighs using iodine and his infra-xiphoid abdomen using chlorhexidine gluconate after clipper shaving.  He was further fashioned to the operative table using 3-inch tape over foam padding.  A test of steep Trendelenburg positioning was performed and he was found to be suitably positioned.  Next, a high-flow, low-pressure pneumoperitoneum was obtained using Veress technique in the supraumbilical midline having passed the aspiration and drop test and after Foley catheter was placed.  Next, an 8-mm robotic camera port was placed in the same location.  Laparoscopic examination of the peritoneal cavity revealed no significant adhesions and no visceral injury.  There was a large volume of pelvic fat, but not frank lipomatosis.  Additional ports were then placed as follows:  Right paramedian 8-mm robotic port, right far lateral 12-mm assistant port, right paramedian 5-mm suction port, left paramedian 8-mm robotic port, left far lateral 8-mm robotic port.  Robot was docked and passed through the electronic checks. Initial attention was directed at development of space of Retzius. Incision was made lateral to the left medial umbilical ligament from the midline towards the area of the internal ring coursing along the iliac vessels towards the area of the left ureter.  The left vas deferens was encountered, purposely transected and used as a medial bucket-handle and the left bladder wall was swept away from the left pelvic sidewall towards the area of the endopelvic  fascia.  A mirror-imaged dissection was performed on the right side.  Anterior attachments were taken down with cautery scissors.  This exposed the anterior base of the prostate in the bladder-neck junction, which was defatted to allow better demarcation of this site.  This tissue was set aside, labeled the periprostatic fat.  Next, 0.2 cc of indocyanine green dye was injected using a percutaneously-placed, robotically-guided  spinal needle into each lobe of the prostate with intervening suctioning to avoid dye spillage, which did not occur.  Next, the endopelvic fascia was swept away from the lateral aspect of the prostate in a base-to-apex orientation.  This exposed the dorsal venous complex, which was carefully controlled using vascular load stapler.  It had been approximately 10 minutes post dye injection and the pelvis was inspected under near-infrared fluorescence light.  Sentinel lymphangiography revealed several lymphatic channels coursing across the base of the prostate and bladder towards the pelvic lymph node fields.  There was a dominant left perivesical lymph node noted. As such, template lymphadenectomy was performed on the right side. First, the external iliac group with confines being right external iliac artery, vein, pelvic side wall, and iliac bifurcation.  Lymphostasis was achieved with cold clips.  Next, right obturator group was dissected free with confines being right obturator nerve, pelvic sidewall and external iliac vein.  Lymphostasis was achieved with cold clips.  The right obturator nerve was inspected following these maneuvers and found to be uninjured.  Similarly, the left external and obturator groups lymph nodes were dissected free and left obturator nerve was also inspected and found to be uninjured.  The left perivesical lymph node was dissected free.  Lymphostasis was achieved with cold clips, set aside, labeled left perivesical lymph node, sentinel.  Attention was then directed at bladder neck dissection.  The bladder neck was identified in the anterior plane moving the Foley catheter back and forth.  A lateral release was performed to better allow the demarcation of the bladder neck and prostate, and the prostate and bladder neck were separated in anterior to posterior direction keeping what appeared to be a rim of circular muscle fibers of each plane of dissection,  resulted in excellent caliber bladder neck.  Posterior dissection was performed by incising approximately 7 mm inferior-posterior to the posterior lip of the prostate entering the plane of Denonvilliers.  Bilateral seminal vesicles were encountered, dissected for distance approximately 4 cm, ligated and placed on gentle superior traction.  Bilateral seminal vesicles were dissected to their tip and placed on gentle superior traction.  Additional posterior dissection was performed in a plane just anterior perirectal fat toward the area of the prostatic apex, this exposed the vascular pedicles.  Given the large volume of disease, minimal nerve sparing was performed and a purposeful wide dissection was performed with the pedicles using a sequential clipping technique in a base-to-apex orientation.  Final apical dissection was performed in the anterior plane by placing the prostate on gentle superior traction and coldly transecting the membranous urethra.  This completely freed up the prostatectomy specimens, which was placed on EndoCatch bag for later retrieval.  Digital rectal exam was then performed using indicator glove under laparoscopic vision.  No evidence of rectal violation was noted. Posterior reconstruction was performed using a single 3-0 V-Loc suture reapproximating the posterior urethral plate to the posterior bladder perivesical tissue bringing these structures in a tension-free apposition.  Mucosa-to-mucosa anastomosis was then performed using double-armed 3-0 V-Loc suture from the 6 o'clock to 12 o'clock position.  A new Foley catheter was placed, which irrigated quantitatively.  All sponge and needle counts were correct.  Hemostasis appeared excellent. A closed suction drain was brought through the previous left lateral most robotic port site into the area of the peritoneal cavity.  The right far lateral 12-mm assistant port was closed at the level of the fascia using  Carter-Thomason suture passer and 0 Vicryl.  Robot was then undocked.  Specimen was retrieved by extending the previous camera port site superiorly for distance approximately 3 cm removing the prostatectomy specimen, setting aside for permanent pathology.  The extraction site was closed at the level of the fascia using figure-of- eight PDS x3 followed by reapproximation of Scarpa's and running Vicryl. All incision sites were infiltrated with dilute lyophilized Marcaine and closed at the level of the skin using subcuticular Monocryl followed by Dermabond.  Procedure was then terminated.  The patient tolerated the procedure well.  There were no immediate periprocedural complications. The patient was taken to the postanesthesia care unit in stable condition.  Please note, first assistant, Debbrah Alar, was absolutely crucial for all robotic portions of the procedure today.  She provided invaluable retraction, vascular clipping, vascular stapling, suture passage; without which, this would not be possible.          ______________________________ Alexis Frock, MD     TM/MEDQ  D:  06/16/2017  T:  06/16/2017  Job:  [id number]

## 2017-06-17 NOTE — Discharge Summary (Signed)
Physician Discharge Summary  Patient ID: Scott Mcgee MRN: [id number] DOB/AGE: [birth year] 57 y.o.  Admit date: 06/16/2017 Discharge date: 06/17/2017  Admission Diagnoses: Prostate Cancer  Discharge Diagnoses:  Active Problems:   Prostate cancer [facility])   Discharged Condition: good  Hospital Course:  Pt underwent robotic prostatectomy + ICG + nodes on 06/16/17, the day of admission without acute complication. He was admitted to 4th floor Urolocy service post-o for observation. By the afternoon of POD 1 he is ambulatory, pain controlled on PO meds, maintaining PO hydration, and felt to be adequate for discharge. JP removed prior to discharge as outptu minimal and Cr same as serum. DC Hgb 10.1, Cr 1.0, path pending.   Consults: None  Significant Diagnostic Studies: labs: as per above  Treatments: surgery:  As per above.   Discharge Exam: Blood pressure 116/71, pulse 65, temperature 97.8 F (36.6 C), temperature source Oral, resp. rate 18, height 5\' 6"  (1.676 m), weight 70.1 kg (154 lb 8 oz), SpO2 100 %. General appearance: alert, cooperative, appears stated age and family at bedside Eyes: negative Nose: Nares normal. Septum midline. Mucosa normal. No drainage or sinus tenderness. Throat: lips, mucosa, and tongue normal; teeth and gums normal Neck: supple, symmetrical, trachea midline Back: symmetric, no curvature. ROM normal. No CVA tenderness. Resp: non-labored on room air.  Cardio: Nl rate GI: soft, non-tender; bowel sounds normal; no masses,  no organomegaly Male genitalia: normal Extremities: extremities normal, atraumatic, no cyanosis or edema Pulses: 2+ and symmetric Skin: Skin color, texture, turgor normal. No rashes or lesions Lymph nodes: Cervical, supraclavicular, and axillary nodes normal. Neurologic: Grossly normal  Recent incision sites c/d/i w/o hernias. Foely in place with medium yellow urine.   Disposition: 01-Home or Self Care   Allergies as of 06/17/2017      Reactions   Clotrimazole 3 [clotrimazole] Hives   cream      Medication List    STOP taking these medications   cephALEXin 500 MG capsule Commonly known as:  KEFLEX     TAKE these medications   AMLODIPINE BESYLATE PO Take 10 mg by mouth every morning.   atorvastatin 20 MG tablet Commonly known as:  LIPITOR Take 20 mg by mouth daily.   hydrochlorothiazide 25 MG tablet Commonly known as:  HYDRODIURIL Take 25 mg by mouth daily.   HYDROcodone-acetaminophen 5-325 MG tablet Commonly known as:  NORCO Take 1-2 tablets by mouth every 6 (six) hours as needed for moderate pain or severe pain.   metFORMIN 500 MG tablet Commonly known as:  GLUCOPHAGE Take 500 mg by mouth daily with breakfast.   senna-docusate 8.6-50 MG tablet Commonly known as:  Senokot-S Take 1 tablet by mouth 2 (two) times daily. While taking pain meds to prevent constipation   sulfamethoxazole-trimethoprim 800-160 MG tablet Commonly known as:  BACTRIM DS,SEPTRA DS Take 1 tablet by mouth 2 (two) times daily. Start the day prior to foley removal appointment      Follow-up Information    Alexis Frock, MD On 06/28/2017.   Specialty:  Urology Why:  at 10:30 AM for MD visit and office catheter removal. Dr. Tresa Moore will call you with pathology results when available.  Contact information: [place] [address] [phone]           Signed: Alexis Frock 06/17/2017, 12:29 PM

## 2019-06-12 ENCOUNTER — Emergency Department (HOSPITAL_COMMUNITY): Payer: Medicare Other

## 2019-06-12 ENCOUNTER — Inpatient Hospital Stay (HOSPITAL_COMMUNITY)
Admission: EM | Admit: 2019-06-12 | Discharge: 2019-06-29 | DRG: 870 | Disposition: A | Discharge status: Home Health | Payer: Medicare Other | Attending: Internal Medicine | Admitting: Internal Medicine

## 2019-06-12 ENCOUNTER — Other Ambulatory Visit: Payer: Self-pay

## 2019-06-12 ENCOUNTER — Encounter (HOSPITAL_COMMUNITY): Payer: Self-pay | Admitting: Emergency Medicine

## 2019-06-12 ENCOUNTER — Inpatient Hospital Stay (HOSPITAL_COMMUNITY): Payer: Medicare Other

## 2019-06-12 DIAGNOSIS — R0600 Dyspnea, unspecified: Secondary | ICD-10-CM

## 2019-06-12 DIAGNOSIS — N10 Acute tubulo-interstitial nephritis: Secondary | ICD-10-CM | POA: Diagnosis present

## 2019-06-12 DIAGNOSIS — E11649 Type 2 diabetes mellitus with hypoglycemia without coma: Secondary | ICD-10-CM | POA: Diagnosis not present

## 2019-06-12 DIAGNOSIS — R0602 Shortness of breath: Secondary | ICD-10-CM | POA: Diagnosis not present

## 2019-06-12 DIAGNOSIS — Z79899 Other long term (current) drug therapy: Secondary | ICD-10-CM

## 2019-06-12 DIAGNOSIS — J9600 Acute respiratory failure, unspecified whether with hypoxia or hypercapnia: Secondary | ICD-10-CM | POA: Diagnosis not present

## 2019-06-12 DIAGNOSIS — Z1624 Resistance to multiple antibiotics: Secondary | ICD-10-CM | POA: Diagnosis present

## 2019-06-12 DIAGNOSIS — D72829 Elevated white blood cell count, unspecified: Secondary | ICD-10-CM

## 2019-06-12 DIAGNOSIS — I5032 Chronic diastolic (congestive) heart failure: Secondary | ICD-10-CM | POA: Diagnosis present

## 2019-06-12 DIAGNOSIS — N170 Acute kidney failure with tubular necrosis: Secondary | ICD-10-CM | POA: Diagnosis present

## 2019-06-12 DIAGNOSIS — E1165 Type 2 diabetes mellitus with hyperglycemia: Secondary | ICD-10-CM

## 2019-06-12 DIAGNOSIS — Z8546 Personal history of malignant neoplasm of prostate: Secondary | ICD-10-CM

## 2019-06-12 DIAGNOSIS — Z7984 Long term (current) use of oral hypoglycemic drugs: Secondary | ICD-10-CM | POA: Diagnosis not present

## 2019-06-12 DIAGNOSIS — Z789 Other specified health status: Secondary | ICD-10-CM

## 2019-06-12 DIAGNOSIS — R4182 Altered mental status, unspecified: Secondary | ICD-10-CM | POA: Diagnosis not present

## 2019-06-12 DIAGNOSIS — B370 Candidal stomatitis: Secondary | ICD-10-CM | POA: Diagnosis not present

## 2019-06-12 DIAGNOSIS — E669 Obesity, unspecified: Secondary | ICD-10-CM | POA: Diagnosis present

## 2019-06-12 DIAGNOSIS — F319 Bipolar disorder, unspecified: Secondary | ICD-10-CM | POA: Diagnosis present

## 2019-06-12 DIAGNOSIS — J9690 Respiratory failure, unspecified, unspecified whether with hypoxia or hypercapnia: Secondary | ICD-10-CM

## 2019-06-12 DIAGNOSIS — I130 Hypertensive heart and chronic kidney disease with heart failure and stage 1 through stage 4 chronic kidney disease, or unspecified chronic kidney disease: Secondary | ICD-10-CM | POA: Diagnosis present

## 2019-06-12 DIAGNOSIS — J690 Pneumonitis due to inhalation of food and vomit: Secondary | ICD-10-CM | POA: Diagnosis not present

## 2019-06-12 DIAGNOSIS — E871 Hypo-osmolality and hyponatremia: Secondary | ICD-10-CM | POA: Diagnosis present

## 2019-06-12 DIAGNOSIS — E1110 Type 2 diabetes mellitus with ketoacidosis without coma: Secondary | ICD-10-CM

## 2019-06-12 DIAGNOSIS — N179 Acute kidney failure, unspecified: Secondary | ICD-10-CM

## 2019-06-12 DIAGNOSIS — T40415A Adverse effect of fentanyl or fentanyl analogs, initial encounter: Secondary | ICD-10-CM | POA: Diagnosis not present

## 2019-06-12 DIAGNOSIS — I952 Hypotension due to drugs: Secondary | ICD-10-CM | POA: Diagnosis not present

## 2019-06-12 DIAGNOSIS — R6520 Severe sepsis without septic shock: Secondary | ICD-10-CM | POA: Diagnosis present

## 2019-06-12 DIAGNOSIS — G9341 Metabolic encephalopathy: Secondary | ICD-10-CM | POA: Diagnosis present

## 2019-06-12 DIAGNOSIS — Z7289 Other problems related to lifestyle: Secondary | ICD-10-CM

## 2019-06-12 DIAGNOSIS — Z781 Physical restraint status: Secondary | ICD-10-CM

## 2019-06-12 DIAGNOSIS — R7989 Other specified abnormal findings of blood chemistry: Secondary | ICD-10-CM | POA: Diagnosis not present

## 2019-06-12 DIAGNOSIS — R748 Abnormal levels of other serum enzymes: Secondary | ICD-10-CM

## 2019-06-12 DIAGNOSIS — Z452 Encounter for adjustment and management of vascular access device: Secondary | ICD-10-CM

## 2019-06-12 DIAGNOSIS — E869 Volume depletion, unspecified: Secondary | ICD-10-CM | POA: Diagnosis present

## 2019-06-12 DIAGNOSIS — I10 Essential (primary) hypertension: Secondary | ICD-10-CM | POA: Diagnosis not present

## 2019-06-12 DIAGNOSIS — J9601 Acute respiratory failure with hypoxia: Secondary | ICD-10-CM | POA: Diagnosis present

## 2019-06-12 DIAGNOSIS — E1100 Type 2 diabetes mellitus with hyperosmolarity without nonketotic hyperglycemic-hyperosmolar coma (NKHHC): Secondary | ICD-10-CM | POA: Diagnosis present

## 2019-06-12 DIAGNOSIS — Z20822 Contact with and (suspected) exposure to covid-19: Secondary | ICD-10-CM | POA: Diagnosis present

## 2019-06-12 DIAGNOSIS — A4151 Sepsis due to Escherichia coli [E. coli]: Secondary | ICD-10-CM | POA: Diagnosis present

## 2019-06-12 DIAGNOSIS — R140 Abdominal distension (gaseous): Secondary | ICD-10-CM | POA: Diagnosis not present

## 2019-06-12 DIAGNOSIS — Z9079 Acquired absence of other genital organ(s): Secondary | ICD-10-CM | POA: Diagnosis not present

## 2019-06-12 DIAGNOSIS — A419 Sepsis, unspecified organism: Secondary | ICD-10-CM | POA: Diagnosis not present

## 2019-06-12 DIAGNOSIS — Z809 Family history of malignant neoplasm, unspecified: Secondary | ICD-10-CM | POA: Diagnosis not present

## 2019-06-12 DIAGNOSIS — Z6829 Body mass index (BMI) 29.0-29.9, adult: Secondary | ICD-10-CM

## 2019-06-12 DIAGNOSIS — R188 Other ascites: Secondary | ICD-10-CM

## 2019-06-12 DIAGNOSIS — D6489 Other specified anemias: Secondary | ICD-10-CM | POA: Diagnosis present

## 2019-06-12 DIAGNOSIS — E870 Hyperosmolality and hypernatremia: Secondary | ICD-10-CM | POA: Diagnosis not present

## 2019-06-12 DIAGNOSIS — K760 Fatty (change of) liver, not elsewhere classified: Secondary | ICD-10-CM | POA: Diagnosis present

## 2019-06-12 DIAGNOSIS — Z978 Presence of other specified devices: Secondary | ICD-10-CM

## 2019-06-12 DIAGNOSIS — G9340 Encephalopathy, unspecified: Secondary | ICD-10-CM | POA: Diagnosis not present

## 2019-06-12 DIAGNOSIS — E1122 Type 2 diabetes mellitus with diabetic chronic kidney disease: Secondary | ICD-10-CM | POA: Diagnosis present

## 2019-06-12 DIAGNOSIS — E162 Hypoglycemia, unspecified: Secondary | ICD-10-CM | POA: Diagnosis not present

## 2019-06-12 DIAGNOSIS — F1010 Alcohol abuse, uncomplicated: Secondary | ICD-10-CM | POA: Diagnosis present

## 2019-06-12 DIAGNOSIS — E875 Hyperkalemia: Secondary | ICD-10-CM | POA: Diagnosis not present

## 2019-06-12 DIAGNOSIS — F419 Anxiety disorder, unspecified: Secondary | ICD-10-CM | POA: Diagnosis present

## 2019-06-12 DIAGNOSIS — E7800 Pure hypercholesterolemia, unspecified: Secondary | ICD-10-CM | POA: Diagnosis present

## 2019-06-12 DIAGNOSIS — R0902 Hypoxemia: Secondary | ICD-10-CM

## 2019-06-12 DIAGNOSIS — B379 Candidiasis, unspecified: Secondary | ICD-10-CM | POA: Diagnosis not present

## 2019-06-12 DIAGNOSIS — N184 Chronic kidney disease, stage 4 (severe): Secondary | ICD-10-CM | POA: Diagnosis present

## 2019-06-12 LAB — BASIC METABOLIC PANEL
Anion gap: 17 — ABNORMAL HIGH (ref 5–15)
Anion gap: 16 — ABNORMAL HIGH (ref 5–15)
Anion gap: 15 (ref 5–15)
BUN: 41 mg/dL — ABNORMAL HIGH (ref 8–23)
BUN: 47 mg/dL — ABNORMAL HIGH (ref 8–23)
BUN: 48 mg/dL — ABNORMAL HIGH (ref 8–23)
CO2: 23 mmol/L (ref 22–32)
CO2: 23 mmol/L (ref 22–32)
CO2: 21 mmol/L — ABNORMAL LOW (ref 22–32)
Calcium: 9.9 mg/dL (ref 8.9–10.3)
Calcium: 9.5 mg/dL (ref 8.9–10.3)
Calcium: 8.8 mg/dL — ABNORMAL LOW (ref 8.9–10.3)
Chloride: 82 mmol/L — ABNORMAL LOW (ref 98–111)
Chloride: 87 mmol/L — ABNORMAL LOW (ref 98–111)
Chloride: 88 mmol/L — ABNORMAL LOW (ref 98–111)
Creatinine, Ser: 3.49 mg/dL — ABNORMAL HIGH (ref 0.61–1.24)
Creatinine, Ser: 3.61 mg/dL — ABNORMAL HIGH (ref 0.61–1.24)
Creatinine, Ser: 3.52 mg/dL — ABNORMAL HIGH (ref 0.61–1.24)
GFR calc Af Amer: 21 mL/min — ABNORMAL LOW (ref >60)
GFR calc Af Amer: 20 mL/min — ABNORMAL LOW (ref >60)
GFR calc Af Amer: 20 mL/min — ABNORMAL LOW (ref >60)
GFR calc non Af Amer: 18 mL/min — ABNORMAL LOW (ref >60)
GFR calc non Af Amer: 17 mL/min — ABNORMAL LOW (ref >60)
GFR calc non Af Amer: 18 mL/min — ABNORMAL LOW (ref >60)
Glucose, Bld: 398 mg/dL — ABNORMAL HIGH (ref 70–99)
Glucose, Bld: 175 mg/dL — ABNORMAL HIGH (ref 70–99)
Glucose, Bld: 172 mg/dL — ABNORMAL HIGH (ref 70–99)
Potassium: 4.1 mmol/L (ref 3.5–5.1)
Potassium: 3.7 mmol/L (ref 3.5–5.1)
Potassium: 4 mmol/L (ref 3.5–5.1)
Sodium: 122 mmol/L — ABNORMAL LOW (ref 135–145)
Sodium: 126 mmol/L — ABNORMAL LOW (ref 135–145)
Sodium: 124 mmol/L — ABNORMAL LOW (ref 135–145)

## 2019-06-12 LAB — COMPREHENSIVE METABOLIC PANEL
ALT: 47 U/L — ABNORMAL HIGH (ref 0–44)
AST: 54 U/L — ABNORMAL HIGH (ref 15–41)
Albumin: 3.5 g/dL (ref 3.5–5.0)
Alkaline Phosphatase: 285 U/L — ABNORMAL HIGH (ref 38–126)
Anion gap: 17 — ABNORMAL HIGH (ref 5–15)
BUN: 40 mg/dL — ABNORMAL HIGH (ref 8–23)
CO2: 26 mmol/L (ref 22–32)
Calcium: 10.1 mg/dL (ref 8.9–10.3)
Chloride: 76 mmol/L — ABNORMAL LOW (ref 98–111)
Creatinine, Ser: 3.56 mg/dL — ABNORMAL HIGH (ref 0.61–1.24)
GFR calc Af Amer: 20 mL/min — ABNORMAL LOW (ref >60)
GFR calc non Af Amer: 17 mL/min — ABNORMAL LOW (ref >60)
Glucose, Bld: 561 mg/dL (ref 70–99)
Potassium: 4.8 mmol/L (ref 3.5–5.1)
Sodium: 119 mmol/L — CL (ref 135–145)
Total Bilirubin: 2.2 mg/dL — ABNORMAL HIGH (ref 0.3–1.2)
Total Protein: 7.4 g/dL (ref 6.5–8.1)

## 2019-06-12 LAB — RESPIRATORY PANEL BY RT PCR (FLU A&B, COVID)
Influenza A by PCR: NEGATIVE
Influenza B by PCR: NEGATIVE
SARS Coronavirus 2 by RT PCR: NEGATIVE

## 2019-06-12 LAB — CBC
HCT: 31.9 % — ABNORMAL LOW (ref 39.0–52.0)
Hemoglobin: 10.5 g/dL — ABNORMAL LOW (ref 13.0–17.0)
MCH: 29.8 pg (ref 26.0–34.0)
MCHC: 32.9 g/dL (ref 30.0–36.0)
MCV: 90.6 fL (ref 80.0–100.0)
Platelets: 202 10*3/uL (ref 150–400)
RBC: 3.52 MIL/uL — ABNORMAL LOW (ref 4.22–5.81)
RDW: 13.3 % (ref 11.5–15.5)
WBC: 14.7 10*3/uL — ABNORMAL HIGH (ref 4.0–10.5)
nRBC: 0 % (ref 0.0–0.2)

## 2019-06-12 LAB — CBC WITH DIFFERENTIAL/PLATELET
Abs Immature Granulocytes: 1.81 10*3/uL — ABNORMAL HIGH (ref 0.00–0.07)
Basophils Absolute: 0 10*3/uL (ref 0.0–0.1)
Basophils Relative: 0 %
Eosinophils Absolute: 0.2 10*3/uL (ref 0.0–0.5)
Eosinophils Relative: 1 %
HCT: 34.8 % — ABNORMAL LOW (ref 39.0–52.0)
Hemoglobin: 11.3 g/dL — ABNORMAL LOW (ref 13.0–17.0)
Immature Granulocytes: 10 %
Lymphocytes Relative: 3 %
Lymphs Abs: 0.5 10*3/uL — ABNORMAL LOW (ref 0.7–4.0)
MCH: 29.3 pg (ref 26.0–34.0)
MCHC: 32.5 g/dL (ref 30.0–36.0)
MCV: 90.2 fL (ref 80.0–100.0)
Monocytes Absolute: 1 10*3/uL (ref 0.1–1.0)
Monocytes Relative: 5 %
Neutro Abs: 15.2 10*3/uL — ABNORMAL HIGH (ref 1.7–7.7)
Neutrophils Relative %: 81 %
Platelets: 224 10*3/uL (ref 150–400)
RBC: 3.86 MIL/uL — ABNORMAL LOW (ref 4.22–5.81)
RDW: 13.1 % (ref 11.5–15.5)
WBC: 18.8 10*3/uL — ABNORMAL HIGH (ref 4.0–10.5)
nRBC: 0 % (ref 0.0–0.2)

## 2019-06-12 LAB — LACTIC ACID, PLASMA
Lactic Acid, Venous: 3.6 mmol/L (ref 0.5–1.9)
Lactic Acid, Venous: 2.7 mmol/L (ref 0.5–1.9)

## 2019-06-12 LAB — GLUCOSE, CAPILLARY
Glucose-Capillary: 131 mg/dL — ABNORMAL HIGH (ref 70–99)
Glucose-Capillary: 108 mg/dL — ABNORMAL HIGH (ref 70–99)
Glucose-Capillary: 176 mg/dL — ABNORMAL HIGH (ref 70–99)
Glucose-Capillary: 201 mg/dL — ABNORMAL HIGH (ref 70–99)
Glucose-Capillary: 185 mg/dL — ABNORMAL HIGH (ref 70–99)
Glucose-Capillary: 179 mg/dL — ABNORMAL HIGH (ref 70–99)
Glucose-Capillary: 153 mg/dL — ABNORMAL HIGH (ref 70–99)

## 2019-06-12 LAB — SARS CORONAVIRUS 2 (TAT 6-24 HRS): SARS Coronavirus 2: NEGATIVE

## 2019-06-12 LAB — TROPONIN I (HIGH SENSITIVITY)
Troponin I (High Sensitivity): 12 ng/L (ref <18)
Troponin I (High Sensitivity): 13 ng/L (ref <18)

## 2019-06-12 LAB — URINALYSIS, ROUTINE W REFLEX MICROSCOPIC
Bilirubin Urine: NEGATIVE
Glucose, UA: >=500 mg/dL — AB
Ketones, ur: NEGATIVE mg/dL
Nitrite: NEGATIVE
Protein, ur: 30 mg/dL — AB
Specific Gravity, Urine: 1.018 (ref 1.005–1.030)
WBC, UA: >50 WBC/hpf — ABNORMAL HIGH (ref 0–5)
pH: 5 (ref 5.0–8.0)

## 2019-06-12 LAB — CBG MONITORING, ED
Glucose-Capillary: 495 mg/dL — ABNORMAL HIGH (ref 70–99)
Glucose-Capillary: 527 mg/dL (ref 70–99)
Glucose-Capillary: 371 mg/dL — ABNORMAL HIGH (ref 70–99)
Glucose-Capillary: 357 mg/dL — ABNORMAL HIGH (ref 70–99)
Glucose-Capillary: 295 mg/dL — ABNORMAL HIGH (ref 70–99)
Glucose-Capillary: 255 mg/dL — ABNORMAL HIGH (ref 70–99)

## 2019-06-12 LAB — BLOOD GAS, VENOUS
Acid-Base Excess: 0.3 mmol/L (ref 0.0–2.0)
Bicarbonate: 24.5 mmol/L (ref 20.0–28.0)
FIO2: 21
O2 Saturation: 83.6 %
Patient temperature: 37
pCO2, Ven: 38.5 mmHg — ABNORMAL LOW (ref 44.0–60.0)
pH, Ven: 7.417 (ref 7.250–7.430)
pO2, Ven: 49.8 mmHg — ABNORMAL HIGH (ref 32.0–45.0)

## 2019-06-12 LAB — LIPASE, BLOOD: Lipase: 25 U/L (ref 11–51)

## 2019-06-12 LAB — PROTIME-INR
INR: 1.5 — ABNORMAL HIGH (ref 0.8–1.2)
Prothrombin Time: 17.9 seconds — ABNORMAL HIGH (ref 11.4–15.2)

## 2019-06-12 LAB — APTT: aPTT: 45 seconds — ABNORMAL HIGH (ref 24–36)

## 2019-06-12 LAB — BETA-HYDROXYBUTYRIC ACID: Beta-Hydroxybutyric Acid: 0.26 mmol/L (ref 0.05–0.27)

## 2019-06-12 LAB — MRSA PCR SCREENING: MRSA by PCR: NEGATIVE

## 2019-06-12 MED ORDER — ACETAMINOPHEN 325 MG PO TABS
650.0000 mg | ORAL_TABLET | Freq: Once | ORAL | Status: AC
  Administered 2019-06-12: 15:00:00 650 mg via ORAL

## 2019-06-12 MED ORDER — INSULIN REGULAR(HUMAN) IN NACL 100-0.9 UT/100ML-% IV SOLN
INTRAVENOUS | Status: DC
  Administered 2019-06-12: 9.5 [IU]/h via INTRAVENOUS

## 2019-06-12 MED ORDER — SODIUM CHLORIDE 0.9 % IV SOLN
2.0000 g | INTRAVENOUS | Status: DC
  Administered 2019-06-12: 2 g via INTRAVENOUS
  Filled 2019-06-12: qty 20

## 2019-06-12 MED ORDER — SODIUM CHLORIDE 0.9 % IV BOLUS
1000.0000 mL | Freq: Once | INTRAVENOUS | Status: AC
  Administered 2019-06-12: 10:00:00 1000 mL via INTRAVENOUS

## 2019-06-12 MED ORDER — INSULIN REGULAR(HUMAN) IN NACL 100-0.9 UT/100ML-% IV SOLN
INTRAVENOUS | Status: DC
  Administered 2019-06-13: 02:00:00 3.6 [IU]/h via INTRAVENOUS
  Filled 2019-06-12: qty 100

## 2019-06-12 MED ORDER — CHLORHEXIDINE GLUCONATE CLOTH 2 % EX PADS
6.0000 | MEDICATED_PAD | Freq: Every day | CUTANEOUS | Status: DC
  Administered 2019-06-12 – 2019-06-14 (×3): 6 via TOPICAL

## 2019-06-12 MED ORDER — HEPARIN SODIUM (PORCINE) 5000 UNIT/ML IJ SOLN
5000.0000 [IU] | Freq: Three times a day (TID) | INTRAMUSCULAR | Status: DC
  Administered 2019-06-12 – 2019-06-14 (×5): 5000 [IU] via SUBCUTANEOUS
  Filled 2019-06-12 (×6): qty 1

## 2019-06-12 MED ORDER — ACETAMINOPHEN 325 MG PO TABS
650.0000 mg | ORAL_TABLET | Freq: Four times a day (QID) | ORAL | Status: DC | PRN
  Administered 2019-06-12: 650 mg via ORAL
  Filled 2019-06-12: qty 2

## 2019-06-12 MED ORDER — SODIUM CHLORIDE 0.9 % IV BOLUS
1000.0000 mL | Freq: Once | INTRAVENOUS | Status: AC
  Administered 2019-06-12: 17:00:00 1000 mL via INTRAVENOUS

## 2019-06-12 MED ORDER — DEXTROSE-NACL 5-0.45 % IV SOLN: INTRAVENOUS | Status: DC

## 2019-06-12 MED ORDER — POTASSIUM CHLORIDE 10 MEQ/100ML IV SOLN
10.0000 meq | INTRAVENOUS | Status: AC
  Administered 2019-06-12 (×2): 10 meq via INTRAVENOUS
  Filled 2019-06-12: qty 100

## 2019-06-12 MED ORDER — DEXTROSE 50 % IV SOLN: 0.0000 mL | INTRAVENOUS | Status: DC | PRN

## 2019-06-12 MED ORDER — SODIUM CHLORIDE 0.9 % IV SOLN
1.0000 g | Freq: Two times a day (BID) | INTRAVENOUS | Status: DC
  Administered 2019-06-13 – 2019-06-14 (×3): 1 g via INTRAVENOUS
  Filled 2019-06-12 (×5): qty 1

## 2019-06-12 MED ORDER — SODIUM CHLORIDE 0.9 % IV BOLUS
1000.0000 mL | INTRAVENOUS | Status: AC
  Administered 2019-06-12: 19:00:00 1000 mL via INTRAVENOUS

## 2019-06-12 MED ORDER — INSULIN ASPART 100 UNIT/ML ~~LOC~~ SOLN
8.0000 [IU] | Freq: Once | SUBCUTANEOUS | Status: AC
  Administered 2019-06-12: 8 [IU] via SUBCUTANEOUS
  Filled 2019-06-12: qty 1

## 2019-06-12 MED ORDER — ORAL CARE MOUTH RINSE: 15.0000 mL | Freq: Two times a day (BID) | OROMUCOSAL | Status: DC

## 2019-06-12 MED ORDER — SODIUM CHLORIDE 0.9 % IV SOLN: INTRAVENOUS | Status: DC

## 2019-06-12 MED ORDER — DEXTROSE 50 % IV SOLN
0.0000 mL | INTRAVENOUS | Status: DC | PRN
  Administered 2019-06-25: 50 mL via INTRAVENOUS
  Filled 2019-06-12: qty 50

## 2019-06-12 MED ORDER — POTASSIUM CHLORIDE 10 MEQ/100ML IV SOLN
10.0000 meq | INTRAVENOUS | Status: AC
  Administered 2019-06-12: 11:00:00 10 meq via INTRAVENOUS

## 2019-06-12 MED ORDER — POLYETHYLENE GLYCOL 3350 17 G PO PACK
17.0000 g | PACK | Freq: Every day | ORAL | Status: DC
  Administered 2019-06-12: 19:00:00 17 g via ORAL
  Filled 2019-06-12: qty 1

## 2019-06-12 NOTE — Progress Notes (Signed)
CRITICAL VALUE ALERT  Critical Value:  Lactic Acid 2.7  Date & Time Notied:  06/12/2019 2241  Provider Notified: [place]  Orders Received/Actions taken: pending orders

## 2019-06-12 NOTE — ED Notes (Signed)
Date and time results received: 06/12/19 1030 (use smartphrase ".now" to insert current time)  Test: sodium  Critical Value: 119  Name of Provider Notified: Evalee Jefferson PA  Orders Received? Or Actions Taken?: n/a

## 2019-06-12 NOTE — ED Notes (Signed)
Pt taken to CT.

## 2019-06-12 NOTE — ED Provider Notes (Addendum)
[facility] EMERGENCY DEPARTMENT Provider Note   CSN: [id number] Arrival date & time: 06/12/19  0844     History Chief Complaint  Patient presents with  . Bloated    Scott Mcgee is a 59 y.o. male with a past medical history significant for type 2 diabetes, hypertension and hypercholesterolemia presenting with complaint of elevated blood glucose levels last checked yesterday evening was greater than 400.  He also reports abdominal bloating, persistent hiccups and intermittent weakness which has been present for the past 1-2 weeks.  He additionally endorses increased urinary frequency and dysuria with urination.  He has no back pain.  He does have a distended abdomen which she states has been slowly worsening over the past year he denies abdominal pain, no nausea or vomiting.  He does have a tendency for constipation but uses a stool softener.  His last bowel movement was yesterday and normal.  He endorses a history of heavy EtOH use, he quit 2 weeks ago when he started feeling bad.  The history is provided by the patient.       Past Medical History:  Diagnosis Date  . Bipolar 1 disorder ([place])    NO CURRENT MEDS  . Diabetes mellitus without complication ([place])    type 2   . Elevated cholesterol   . Glaucoma    LEFT EYE  . Hypertension   . Prostate cancer [facility])     Patient Active Problem List   Diagnosis Date Noted  . Prostate cancer ([place]) 06/16/2017  . Malignant neoplasm of prostate ([place]) 03/22/2017    Past Surgical History:  Procedure Laterality Date  . LYMPHADENECTOMY Bilateral 06/16/2017   Procedure: LYMPHADENECTOMY;  Surgeon: Alexis Frock, MD;  Location: [facility] ORS;  Service: Urology;  Laterality: Bilateral;  . PROSTATE BIOPSY    . ROBOT ASSISTED LAPAROSCOPIC RADICAL PROSTATECTOMY N/A 06/16/2017   Procedure: ROBOTIC ASSISTED LAPAROSCOPIC RADICAL PROSTATECTOMY;  Surgeon: Alexis Frock, MD;  Location: [facility];  Service: Urology;  Laterality: N/A;       Family  History  Problem Relation Age of Onset  . Cancer Mother        GYN problems    Social History   Tobacco Use  . Smoking status: Never Smoker  . Smokeless tobacco: Never Used  Substance Use Topics  . Alcohol use: No  . Drug use: No    Home Medications Prior to Admission medications   Medication Sig Start Date End Date Taking? Authorizing Provider  acetaminophen (TYLENOL) 500 MG tablet Take 500 mg by mouth every 6 (six) hours as needed for mild pain or moderate pain.   Yes [provider]  AMLODIPINE BESYLATE PO Take 10 mg by mouth every morning.   Yes [provider]  atorvastatin (LIPITOR) 20 MG tablet Take 20 mg by mouth daily.  02/24/17  Yes [provider]  hydrochlorothiazide (HYDRODIURIL) 25 MG tablet Take 25 mg by mouth daily.  02/24/17  Yes [provider]  metFORMIN (GLUCOPHAGE) 500 MG tablet Take 500 mg by mouth daily with breakfast.  02/24/17  Yes [provider]  multivitamin-iron-minerals-folic acid (CENTRUM) chewable tablet Chew 1 tablet by mouth daily.   Yes [provider]  polyethylene glycol (MIRALAX / GLYCOLAX) 17 g packet Take 17 g by mouth daily.   Yes [provider]  HYDROcodone-acetaminophen (NORCO) 5-325 MG tablet Take 1-2 tablets by mouth every 6 (six) hours as needed for moderate pain or severe pain. Patient not taking: Reported on 06/12/2019 06/16/17  Debbrah Alar, PA-C  senna-docusate (SENOKOT-S) 8.6-50 MG tablet Take 1 tablet by mouth 2 (two) times daily. While taking pain meds to prevent constipation Patient not taking: Reported on 06/12/2019 06/17/17   Alexis Frock, MD  sulfamethoxazole-trimethoprim (BACTRIM DS,SEPTRA DS) 800-160 MG tablet Take 1 tablet by mouth 2 (two) times daily. Start the day prior to foley removal appointment 06/16/17   Debbrah Alar, PA-C    Allergies    Clotrimazole 3 [clotrimazole]  Review of Systems   Review of Systems  Constitutional: Negative for chills and  fever.  HENT: Negative for congestion and sore throat.   Eyes: Negative.   Respiratory: Negative for chest tightness and shortness of breath.   Cardiovascular: Negative for chest pain, palpitations and leg swelling.  Gastrointestinal: Positive for abdominal distention and constipation. Negative for abdominal pain, nausea and vomiting.  Genitourinary: Negative.   Musculoskeletal: Negative for arthralgias, joint swelling and neck pain.  Skin: Negative.  Negative for rash and wound.  Neurological: Positive for weakness. Negative for dizziness, light-headedness, numbness and headaches.  Psychiatric/Behavioral: Negative.     Physical Exam Updated Vital Signs BP 109/69   Pulse 97   Temp 97.7 F (36.5 C) (Oral)   Resp (!) 37   Ht 5\' 5"  (1.651 m)   Wt 74.8 kg   SpO2 91%   BMI 27.46 kg/m   Physical Exam Vitals and nursing note reviewed.  Constitutional:      Appearance: He is well-developed.  HENT:     Head: Normocephalic and atraumatic.     Mouth/Throat:     Mouth: Mucous membranes are moist.  Eyes:     Conjunctiva/sclera: Conjunctivae normal.  Cardiovascular:     Rate and Rhythm: Normal rate and regular rhythm.     Heart sounds: Normal heart sounds.  Pulmonary:     Effort: Pulmonary effort is normal.     Breath sounds: Normal breath sounds. No wheezing.  Abdominal:     General: There is distension.     Palpations: Abdomen is soft.     Tenderness: There is no abdominal tenderness. There is no guarding.     Comments: Tight abd with increased tympany throughout. No pain with palpation. No obvious mass, no fluid wave.  Decreased bowel sounds. Occasional hiccup during exam.   Musculoskeletal:        General: Normal range of motion.     Cervical back: Normal range of motion.     Right lower leg: No edema.     Left lower leg: No edema.  Skin:    General: Skin is warm and dry.  Neurological:     Mental Status: He is alert.     ED Results / Procedures / Treatments   Labs  (all labs ordered are listed, but only abnormal results are displayed) Labs Reviewed  COMPREHENSIVE METABOLIC PANEL - Abnormal; Notable for the following components:      Result Value   Sodium 119 (*)    Chloride 76 (*)    Glucose, Bld 561 (*)    BUN 40 (*)    Creatinine, Ser 3.56 (*)    AST 54 (*)    ALT 47 (*)    Alkaline Phosphatase 285 (*)    Total Bilirubin 2.2 (*)    GFR calc non Af Amer 17 (*)    GFR calc Af Amer 20 (*)    Anion gap 17 (*)    All other components within normal limits  CBC WITH DIFFERENTIAL/PLATELET - Abnormal; Notable for  the following components:   WBC 18.8 (*)    RBC 3.86 (*)    Hemoglobin 11.3 (*)    HCT 34.8 (*)    Neutro Abs 15.2 (*)    Lymphs Abs 0.5 (*)    Abs Immature Granulocytes 1.81 (*)    All other components within normal limits  BLOOD GAS, VENOUS - Abnormal; Notable for the following components:   pCO2, Ven 38.5 (*)    pO2, Ven 49.8 (*)    All other components within normal limits  CBG MONITORING, ED - Abnormal; Notable for the following components:   Glucose-Capillary 495 (*)    All other components within normal limits  SARS CORONAVIRUS 2 (TAT 6-24 HRS)  LIPASE, BLOOD  BETA-HYDROXYBUTYRIC ACID  URINALYSIS, ROUTINE W REFLEX MICROSCOPIC  BETA-HYDROXYBUTYRIC ACID  CBG MONITORING, ED  TROPONIN I (HIGH SENSITIVITY)  TROPONIN I (HIGH SENSITIVITY)    EKG EKG Interpretation  Date/Time:  Monday June 12 2019 10:00:17 EST Ventricular Rate:  99 PR Interval:    QRS Duration: 89 QT Interval:  330 QTC Calculation: 424 R Axis:   43 Text Interpretation: Sinus rhythm Borderline low voltage, extremity leads Nonspecific T wave abnormality Confirmed by Ezequiel Essex [phone]) on 06/12/2019 10:06:14 AM   Radiology CT ABDOMEN PELVIS WO CONTRAST  Result Date: 06/12/2019 CLINICAL DATA:  Abdominal pain and discomfort for 1 week EXAM: CT ABDOMEN AND PELVIS WITHOUT CONTRAST TECHNIQUE: Multidetector CT imaging of the abdomen and pelvis was  performed following the standard protocol without IV contrast. COMPARISON:  None. FINDINGS: Lower chest: Mild atelectatic changes are noted in the bases bilaterally. No sizable effusion is seen. Hepatobiliary: Gallbladder is decompressed with multiple small gallstones within. The liver demonstrates mild decreased attenuation consistent with fatty infiltration. Pancreas: Unremarkable. No pancreatic ductal dilatation or surrounding inflammatory changes. Spleen: Normal in size without focal abnormality. Adrenals/Urinary Tract: Adrenal glands are within normal limits. Kidneys demonstrate no evidence of renal calculi or urinary tract obstructive change. Mild perinephric stranding is noted on the left. Underlying urinary tract infection could not be totally excluded. Correlation with lab values is recommended. The bladder is decompressed. Stomach/Bowel: The appendix is within normal limits. No obstructive or inflammatory changes of the bowel are seen. The small bowel and stomach are unremarkable. Vascular/Lymphatic: Aortic atherosclerosis. No enlarged abdominal or pelvic lymph nodes. Reproductive: Prostate has been surgically removed. Other: No abdominal wall hernia or abnormality. No abdominopelvic ascites. Musculoskeletal: No acute bony abnormality is noted. A large exostosis is noted arising from the posterior aspect of the right iliac wing. IMPRESSION: Cholelithiasis without complicating factors. Mild perinephric stranding is noted on the left without obstructive change. Underlying UTI could not be totally excluded. Mild bibasilar atelectasis. Chronic appearing changes as described above. Electronically Signed   By: Inez Catalina M.D.   On: 06/12/2019 11:19    Procedures Procedures (including critical care time)  Medications Ordered in ED Medications  insulin regular, human (MYXREDLIN) 100 units/ 100 mL infusion (9.5 Units/hr Intravenous New Bag/Given 06/12/19 1113)  0.9 %  sodium chloride infusion (has no  administration in time range)  dextrose 5 %-0.45 % sodium chloride infusion ( Intravenous Hold 06/12/19 1045)  dextrose 50 % solution 0-50 mL (has no administration in time range)  potassium chloride 10 mEq in 100 mL IVPB (10 mEq Intravenous New Bag/Given 06/12/19 1115)  sodium chloride 0.9 % bolus 1,000 mL (1,000 mLs Intravenous New Bag/Given 06/12/19 1001)  insulin aspart (novoLOG) injection 8 Units (8 Units Subcutaneous Given 06/12/19 1005)  ED Course  I have reviewed the triage vital signs and the nursing notes.  Pertinent labs & imaging results that were available during my care of the patient were reviewed by me and considered in my medical decision making (see chart for details).    MDM Rules/Calculators/A&P                      Pt with slowly progressive abd distention over 1 year with exam suggesting bowel obstruction, no fluid wave, anasarca or peripheral edema suggesting cirrhosis, although has heavy hx of etoh.  Hiccups, dysuria, hyperglycemia.    Labs reviewed, CT imaging pending at this time.  He is in DKA the Endo tool was ordered to start treating this condition with insulin drip and continued fluid.  He also has acute renal failure, elevated LFTs and significantly elevated WBC count..  His sodium is 119, corrected for glucose level is 126 meq/L.  CRITICAL CARE Performed by: Evalee Jefferson Total critical care time: 45 minutes Critical care time was exclusive of separately billable procedures and treating other patients. Critical care was necessary to treat or prevent imminent or life-threatening deterioration. Critical care was time spent personally by me on the following activities: development of treatment plan with patient and/or surrogate as well as nursing, discussions with consultants, evaluation of patient's response to treatment, examination of patient, obtaining history from patient or surrogate, ordering and performing treatments and interventions, ordering and review of  laboratory studies, ordering and review of radiographic studies, pulse oximetry and re-evaluation of patient's condition.   Pt will require admission, will call once CT imaging is completed. 12:05 PM Ct imaging reviewed. No acute findings to explain abdominal distention.    Discussed with Dr. Roderic Palau who accepts pt for admission. Final Clinical Impression(s) / ED Diagnoses Final diagnoses:  Diabetic ketoacidosis without coma associated with type 2 diabetes mellitus ([place])  Acute renal failure, unspecified acute renal failure type (HCC)  Leukocytosis, unspecified type  Hyponatremia    Rx / DC Orders ED Discharge Orders    None       Landis Martins 06/12/19 1207    Evalee Jefferson, PA-C 06/12/19 1208    Ezequiel Essex, MD 06/12/19 [phone]

## 2019-06-12 NOTE — ED Triage Notes (Addendum)
Pt reports hiccups, abdominal discomfort,dysuria, intermittent dizziness x1 week. Pt reports cbg at home was >400 last night.

## 2019-06-12 NOTE — Progress Notes (Signed)
MD Memon notified of pt's arrival in ICU with HR 104 and BP 82/62. Pt alert and oriented and denies dizziness. Order for 1L NS bolus.

## 2019-06-12 NOTE — H&P (Signed)
History and Physical    DVAUGHN FICKLE WER:[id number] DOB: 1960-10-17 DOA: 06/12/2019  PCP: [facility]  Patient coming from: Home  I have personally briefly reviewed patient's old medical records in [place]  Chief Complaint: Elevated blood sugars  HPI: Scott Mcgee is a 59 y.o. male with medical history significant of diabetes, hypertension, prostate cancer status post prostate resection, presents to the emergency room with a 2-week history of elevated blood sugars.  He reports that his sugars have been over 400.  He has been having intermittent fevers for the past few days.  He describes dysuria for the past 2 weeks.  No abdominal pain or flank pain.  He has had shortness of breath this morning, but no cough.  He has been having hiccups intermittently for the past few weeks.  He has not had any vomiting or diarrhea.  No blurred vision.  He has had a distended abdomen, but wife reports that this is been present for several years now.  ED Course: He was evaluated emergency room where lab work showed that he was hyperglycemic, hyponatremic, elevated creatinine.  Urinalysis indicated possible infection.  CT of the abdomen pelvis performed showed questionable stranding around kidneys but otherwise no acute abnormalities.  He was started on insulin infusion as well as IV fluids and referred for admission.  Review of Systems: As per HPI otherwise 10 point review of systems negative.    Past Medical History:  Diagnosis Date  . Bipolar 1 disorder ([place])    NO CURRENT MEDS  . Diabetes mellitus without complication ([place])    type 2   . Elevated cholesterol   . Glaucoma    LEFT EYE  . Hypertension   . Prostate cancer [facility])     Past Surgical History:  Procedure Laterality Date  . LYMPHADENECTOMY Bilateral 06/16/2017   Procedure: LYMPHADENECTOMY;  Surgeon: Alexis Frock, MD;  Location: [facility];  Service: Urology;  Laterality: Bilateral;  . PROSTATE BIOPSY     . ROBOT ASSISTED LAPAROSCOPIC RADICAL PROSTATECTOMY N/A 06/16/2017   Procedure: ROBOTIC ASSISTED LAPAROSCOPIC RADICAL PROSTATECTOMY;  Surgeon: Alexis Frock, MD;  Location: [facility];  Service: Urology;  Laterality: N/A;    Social History:  reports that he has never smoked. He has never used smokeless tobacco. He reports that he does not drink alcohol or use drugs.  Allergies  Allergen Reactions  . Clotrimazole 3 [Clotrimazole] Hives    cream    Family History  Problem Relation Age of Onset  . Cancer Mother        GYN problems     Prior to Admission medications   Medication Sig Start Date End Date Taking? Authorizing Provider  acetaminophen (TYLENOL) 500 MG tablet Take 500 mg by mouth every 6 (six) hours as needed for mild pain or moderate pain.   Yes [provider]  AMLODIPINE BESYLATE PO Take 10 mg by mouth every morning.   Yes [provider]  atorvastatin (LIPITOR) 20 MG tablet Take 20 mg by mouth daily.  02/24/17  Yes [provider]  hydrochlorothiazide (HYDRODIURIL) 25 MG tablet Take 25 mg by mouth daily.  02/24/17  Yes [provider]  metFORMIN (GLUCOPHAGE) 500 MG tablet Take 500 mg by mouth daily with breakfast.  02/24/17  Yes [provider]  multivitamin-iron-minerals-folic acid (CENTRUM) chewable tablet Chew 1 tablet by mouth daily.   Yes [provider]  polyethylene glycol (MIRALAX / GLYCOLAX) 17 g packet Take 17 g  by mouth daily.   Yes [provider]  HYDROcodone-acetaminophen (NORCO) 5-325 MG tablet Take 1-2 tablets by mouth every 6 (six) hours as needed for moderate pain or severe pain. Patient not taking: Reported on 06/12/2019 06/16/17   Debbrah Alar, PA-C  senna-docusate (SENOKOT-S) 8.6-50 MG tablet Take 1 tablet by mouth 2 (two) times daily. While taking pain meds to prevent constipation Patient not taking: Reported on 06/12/2019 06/17/17   Alexis Frock, MD    Physical Exam: Vitals:   06/12/19  1645 06/12/19 1700 06/12/19 1730 06/12/19 1800  BP: (!) 81/61 (!) 80/59 (!) 78/61 91/60  Pulse: (!) 102 (!) 103 (!) 103 100  Resp: 18 (!) 30 (!) 28 (!) 41  Temp:      TempSrc:      SpO2: 96% 94% 94% 92%  Weight:      Height:        Constitutional: NAD, calm, comfortable Eyes: PERRL, lids and conjunctivae normal ENMT: Mucous membranes are moist. Posterior pharynx clear of any exudate or lesions.Normal dentition.  Neck: normal, supple, no masses, no thyromegaly Respiratory: clear to auscultation bilaterally, no wheezing, no crackles. Normal respiratory effort. No accessory muscle use.  Cardiovascular: Regular rate and rhythm, no murmurs / rubs / gallops. No extremity edema. 2+ pedal pulses. No carotid bruits.  Abdomen: Appears distended, no tenderness, no masses palpated. No hepatosplenomegaly. Bowel sounds positive.  Musculoskeletal: no clubbing / cyanosis. No joint deformity upper and lower extremities. Good ROM, no contractures. Normal muscle tone.  Skin: no rashes, lesions, ulcers. No induration Neurologic: CN 2-12 grossly intact. Sensation intact, DTR normal. Strength 5/5 in all 4.  Psychiatric: Normal judgment and insight. Alert and oriented x 3. Normal mood.    Labs on Admission: I have personally reviewed following labs and imaging studies  CBC: Recent Labs  Lab 06/12/19 0927  WBC 18.8*  NEUTROABS 15.2*  HGB 11.3*  HCT 34.8*  MCV 90.2  PLT 979   Basic Metabolic Panel: Recent Labs  Lab 06/12/19 0927 06/12/19 1253  NA 119* 122*  K 4.8 4.1  CL 76* 82*  CO2 26 23  GLUCOSE 561* 398*  BUN 40* 41*  CREATININE 3.56* 3.49*  CALCIUM 10.1 9.9   GFR: Estimated Creatinine Clearance: 20.1 mL/min (A) (by C-G formula based on SCr of 3.49 mg/dL (H)). Liver Function Tests: Recent Labs  Lab 06/12/19 0927  AST 54*  ALT 47*  ALKPHOS 285*  BILITOT 2.2*  PROT 7.4  ALBUMIN 3.5   Recent Labs  Lab 06/12/19 0927  LIPASE 25   No results for input(s): AMMONIA in the last  168 hours. Coagulation Profile: No results for input(s): INR, PROTIME in the last 168 hours. Cardiac Enzymes: No results for input(s): CKTOTAL, CKMB, CKMBINDEX, TROPONINI in the last 168 hours. BNP (last 3 results) No results for input(s): PROBNP in the last 8760 hours. HbA1C: No results for input(s): HGBA1C in the last 72 hours. CBG: Recent Labs  Lab 06/12/19 1327 06/12/19 1434 06/12/19 1541 06/12/19 1654 06/12/19 1755  GLUCAP 357* 295* 255* 131* 108*   Lipid Profile: No results for input(s): CHOL, HDL, LDLCALC, TRIG, CHOLHDL, LDLDIRECT in the last 72 hours. Thyroid Function Tests: No results for input(s): TSH, T4TOTAL, FREET4, T3FREE, THYROIDAB in the last 72 hours. Anemia Panel: No results for input(s): VITAMINB12, FOLATE, FERRITIN, TIBC, IRON, RETICCTPCT in the last 72 hours. Urine analysis:    Component Value Date/Time   COLORURINE YELLOW 06/12/2019 1302   APPEARANCEUR HAZY (A) 06/12/2019 1302  LABSPEC 1.018 06/12/2019 1302   PHURINE 5.0 06/12/2019 1302   GLUCOSEU >=500 (A) 06/12/2019 1302   HGBUR MODERATE (A) 06/12/2019 1302   BILIRUBINUR NEGATIVE 06/12/2019 1302   KETONESUR NEGATIVE 06/12/2019 1302   PROTEINUR 30 (A) 06/12/2019 1302   NITRITE NEGATIVE 06/12/2019 1302   LEUKOCYTESUR SMALL (A) 06/12/2019 1302    Radiological Exams on Admission: CT ABDOMEN PELVIS WO CONTRAST  Result Date: 06/12/2019 CLINICAL DATA:  Abdominal pain and discomfort for 1 week EXAM: CT ABDOMEN AND PELVIS WITHOUT CONTRAST TECHNIQUE: Multidetector CT imaging of the abdomen and pelvis was performed following the standard protocol without IV contrast. COMPARISON:  None. FINDINGS: Lower chest: Mild atelectatic changes are noted in the bases bilaterally. No sizable effusion is seen. Hepatobiliary: Gallbladder is decompressed with multiple small gallstones within. The liver demonstrates mild decreased attenuation consistent with fatty infiltration. Pancreas: Unremarkable. No pancreatic ductal  dilatation or surrounding inflammatory changes. Spleen: Normal in size without focal abnormality. Adrenals/Urinary Tract: Adrenal glands are within normal limits. Kidneys demonstrate no evidence of renal calculi or urinary tract obstructive change. Mild perinephric stranding is noted on the left. Underlying urinary tract infection could not be totally excluded. Correlation with lab values is recommended. The bladder is decompressed. Stomach/Bowel: The appendix is within normal limits. No obstructive or inflammatory changes of the bowel are seen. The small bowel and stomach are unremarkable. Vascular/Lymphatic: Aortic atherosclerosis. No enlarged abdominal or pelvic lymph nodes. Reproductive: Prostate has been surgically removed. Other: No abdominal wall hernia or abnormality. No abdominopelvic ascites. Musculoskeletal: No acute bony abnormality is noted. A large exostosis is noted arising from the posterior aspect of the right iliac wing. IMPRESSION: Cholelithiasis without complicating factors. Mild perinephric stranding is noted on the left without obstructive change. Underlying UTI could not be totally excluded. Mild bibasilar atelectasis. Chronic appearing changes as described above. Electronically Signed   By: Inez Catalina M.D.   On: 06/12/2019 11:19    EKG: Independently reviewed.  Sinus rhythm without acute changes.  Assessment/Plan Active Problems:   Type 2 diabetes mellitus with hyperosmolar hyperglycemic state (HHS) (HCC)   Hyponatremia   HTN (hypertension)   AKI (acute kidney injury) ([place])   Abdominal distention   Alcohol use     1. Type 2 diabetes with hyperosmolar hyperglycemic state.  Patient is chronically on Metformin.  He has had an elevated blood sugar for the past 2 weeks.  On admission, blood sugar noted to be elevated at 561.  Anion gap of 17.  Serum bicarb 26.  Beta hydroxybutyric acid was negative.  Patient was started on IV fluids and intravenous hydration.  Once blood sugars  are corrected, will transition to basal insulin. 2. Acute kidney injury.  Last creatinine in our system was normal from approximately 2 years ago.  Creatinine in care everywhere from 11/2008 noted to be 0.9.  No evidence of obstruction on CT imaging.  Suspect is related to volume depletion.  Continue IV hydration follow urine output. 3. Sepsis secondary to urinary tract infection.  Patient has fever, leukocytosis, tachycardia on admission with evidence of endorgan damage due to elevated creatinine.  Lactic acid has been ordered.  Blood cultures have been ordered.  Will start on meropenem since he does have a history of urinary tract antibiotic resistance in the past. 4. Hypertension.  Blood pressure currently in the low side.  Hold amlodipine for now. 5. Hyponatremia.  Likely pseudohyponatremia in the setting of severe hyperglycemia.  Continue to monitor as blood sugars correct. 6. Abdominal distention.  Appears to be a chronic issue.  CT abdomen did not show any acute findings.  Continue to monitor. 7. Alcohol use.  Patient reports that he did drink regularly, but has not had any alcohol in the past 2 weeks.  He wants to quit drinking.  DVT prophylaxis: heparin Code Status: full code  Family Communication: discussed with his wife  Disposition Plan: discharge home when his blood sugars have stabilized  Consults called:   Admission status: Inpatient, stepdown  Kathie Dike MD [facility]   If 7PM-7AM, please contact night-coverage [email or web address]   06/12/2019, 6:08 PM

## 2019-06-12 NOTE — Progress Notes (Signed)
Pharmacy Antibiotic Note  Scott Mcgee is a 59 y.o. male admitted on 06/12/2019 with sepsis concern for abdominal infection vs. UTI. Pharmacy has been consulted for meropenem dosing. Pt is in AKI, scr 3.56, est. crcl ~ 20 ml/min, Tm 100.9. wbc 18.8K  Blood and urine cultures are ordered  Plan: - Meropenem 1g IV Q 12 hrs, first dose now - monitor renal function and f/u cultures  Height: 5\' 6"  (167.6 cm) Weight: 166 lb 14.2 oz (75.7 kg) IBW/kg (Calculated) : 63.8  Temp (24hrs), Avg:99 F (37.2 C), Min:97.7 F (36.5 C), Max:100.9 F (38.3 C)  Recent Labs  Lab 06/12/19 0927 06/12/19 1253  WBC 18.8*  --   CREATININE 3.56* 3.49*    Estimated Creatinine Clearance: 20.1 mL/min (A) (by C-G formula based on SCr of 3.49 mg/dL (H)).    Allergies  Allergen Reactions  . Clotrimazole 3 [Clotrimazole] Hives    cream    Antimicrobials this admission: Meropenem >>  Dose adjustments this admission:  Microbiology results: 2/8 BCx:  2/8 UCx:     Thank you for allowing pharmacy to be a part of this patient's care.  Maryanna Shape, PharmD, BCPS, BCPPS Clinical Pharmacist  Pager: [phone]   06/12/2019 6:19 PM

## 2019-06-12 NOTE — ED Notes (Signed)
Date and time results received: 06/12/19 1030 (use smartphrase ".now" to insert current time)  Test: Glucose  Critical Value: 561  Name of Provider Notified: Evalee Jefferson PA  Orders Received? Or Actions Taken?: n/a

## 2019-06-12 NOTE — ED Provider Notes (Signed)
    ED Course/Procedures     Ultrasound ED Abd  Date/Time: 06/12/2019 9:35 AM Performed by: Ezequiel Essex, MD Authorized by: Ezequiel Essex, MD   Procedure details:    Indications: abdominal pain     Assessment for:  Intra-abdominal fluid   Aorta:  Not visualized   Left renal:  Visualized   Right renal:  Visualized   Hepatobiliary:  Visualized   Bladder:  Visualized    Images: archived   Study Limitations: body habitus Left renal findings:    Mass: not identified   Right renal findings:    Mass: not identified   Bladder findings:    Free pelvic fluid: identified   Comments:     No ascites          Ezequiel Essex, MD 06/12/19 [phone]

## 2019-06-12 NOTE — Progress Notes (Addendum)
CRITICAL VALUE ALERT  Critical Value:  Lactic 3.6  Date & Time Notied:  06/12/2019 2030  Provider Notified: [place]  Orders Received/Actions taken: pending new orders; redraw lactic in 3 hours

## 2019-06-13 ENCOUNTER — Inpatient Hospital Stay (HOSPITAL_COMMUNITY): Payer: Medicare Other

## 2019-06-13 ENCOUNTER — Inpatient Hospital Stay (HOSPITAL_COMMUNITY): Payer: Medicare Other | Admitting: Anesthesiology

## 2019-06-13 DIAGNOSIS — N10 Acute tubulo-interstitial nephritis: Secondary | ICD-10-CM

## 2019-06-13 DIAGNOSIS — R0602 Shortness of breath: Secondary | ICD-10-CM

## 2019-06-13 DIAGNOSIS — R140 Abdominal distension (gaseous): Secondary | ICD-10-CM

## 2019-06-13 DIAGNOSIS — N179 Acute kidney failure, unspecified: Secondary | ICD-10-CM

## 2019-06-13 DIAGNOSIS — J9600 Acute respiratory failure, unspecified whether with hypoxia or hypercapnia: Secondary | ICD-10-CM

## 2019-06-13 LAB — GLUCOSE, CAPILLARY
Glucose-Capillary: 113 mg/dL — ABNORMAL HIGH (ref 70–99)
Glucose-Capillary: 151 mg/dL — ABNORMAL HIGH (ref 70–99)
Glucose-Capillary: 160 mg/dL — ABNORMAL HIGH (ref 70–99)
Glucose-Capillary: 168 mg/dL — ABNORMAL HIGH (ref 70–99)
Glucose-Capillary: 173 mg/dL — ABNORMAL HIGH (ref 70–99)
Glucose-Capillary: 175 mg/dL — ABNORMAL HIGH (ref 70–99)
Glucose-Capillary: 178 mg/dL — ABNORMAL HIGH (ref 70–99)
Glucose-Capillary: 181 mg/dL — ABNORMAL HIGH (ref 70–99)
Glucose-Capillary: 220 mg/dL — ABNORMAL HIGH (ref 70–99)
Glucose-Capillary: 223 mg/dL — ABNORMAL HIGH (ref 70–99)

## 2019-06-13 LAB — BASIC METABOLIC PANEL
Anion gap: 14 (ref 5–15)
Anion gap: 14 (ref 5–15)
Anion gap: 13 (ref 5–15)
BUN: 50 mg/dL — ABNORMAL HIGH (ref 8–23)
BUN: 52 mg/dL — ABNORMAL HIGH (ref 8–23)
BUN: 54 mg/dL — ABNORMAL HIGH (ref 8–23)
CO2: 21 mmol/L — ABNORMAL LOW (ref 22–32)
CO2: 21 mmol/L — ABNORMAL LOW (ref 22–32)
CO2: 20 mmol/L — ABNORMAL LOW (ref 22–32)
Calcium: 8.8 mg/dL — ABNORMAL LOW (ref 8.9–10.3)
Calcium: 8.6 mg/dL — ABNORMAL LOW (ref 8.9–10.3)
Calcium: 8.1 mg/dL — ABNORMAL LOW (ref 8.9–10.3)
Chloride: 88 mmol/L — ABNORMAL LOW (ref 98–111)
Chloride: 88 mmol/L — ABNORMAL LOW (ref 98–111)
Chloride: 91 mmol/L — ABNORMAL LOW (ref 98–111)
Creatinine, Ser: 3.41 mg/dL — ABNORMAL HIGH (ref 0.61–1.24)
Creatinine, Ser: 3.54 mg/dL — ABNORMAL HIGH (ref 0.61–1.24)
Creatinine, Ser: 3.95 mg/dL — ABNORMAL HIGH (ref 0.61–1.24)
GFR calc Af Amer: 21 mL/min — ABNORMAL LOW (ref >60)
GFR calc Af Amer: 20 mL/min — ABNORMAL LOW (ref >60)
GFR calc Af Amer: 18 mL/min — ABNORMAL LOW (ref >60)
GFR calc non Af Amer: 18 mL/min — ABNORMAL LOW (ref >60)
GFR calc non Af Amer: 18 mL/min — ABNORMAL LOW (ref >60)
GFR calc non Af Amer: 15 mL/min — ABNORMAL LOW (ref >60)
Glucose, Bld: 179 mg/dL — ABNORMAL HIGH (ref 70–99)
Glucose, Bld: 163 mg/dL — ABNORMAL HIGH (ref 70–99)
Glucose, Bld: 234 mg/dL — ABNORMAL HIGH (ref 70–99)
Potassium: 4.5 mmol/L (ref 3.5–5.1)
Potassium: 4 mmol/L (ref 3.5–5.1)
Potassium: 4.5 mmol/L (ref 3.5–5.1)
Sodium: 123 mmol/L — ABNORMAL LOW (ref 135–145)
Sodium: 123 mmol/L — ABNORMAL LOW (ref 135–145)
Sodium: 124 mmol/L — ABNORMAL LOW (ref 135–145)

## 2019-06-13 LAB — BLOOD GAS, ARTERIAL
Acid-base deficit: 2.2 mmol/L — ABNORMAL HIGH (ref 0.0–2.0)
Acid-base deficit: 3.5 mmol/L — ABNORMAL HIGH (ref 0.0–2.0)
Bicarbonate: 22.4 mmol/L (ref 20.0–28.0)
Bicarbonate: 21.6 mmol/L (ref 20.0–28.0)
FIO2: 36
FIO2: 50
O2 Saturation: 93.8 %
O2 Saturation: 95.9 %
Patient temperature: 36.7
Patient temperature: 37
pCO2 arterial: 40.8 mmHg (ref 32.0–48.0)
pCO2 arterial: 36.6 mmHg (ref 32.0–48.0)
pH, Arterial: 7.359 (ref 7.350–7.450)
pH, Arterial: 7.374 (ref 7.350–7.450)
pO2, Arterial: 70.5 mmHg — ABNORMAL LOW (ref 83.0–108.0)
pO2, Arterial: 81.3 mmHg — ABNORMAL LOW (ref 83.0–108.0)

## 2019-06-13 LAB — OSMOLALITY
Osmolality: 288 mOsm/kg (ref 275–295)
Osmolality: 274 mOsm/kg — ABNORMAL LOW (ref 275–295)

## 2019-06-13 LAB — HEMOGLOBIN A1C
Hgb A1c MFr Bld: 14.1 % — ABNORMAL HIGH (ref 4.8–5.6)
Mean Plasma Glucose: 358 mg/dL

## 2019-06-13 LAB — OSMOLALITY, URINE: Osmolality, Ur: 295 mOsm/kg — ABNORMAL LOW (ref 300–900)

## 2019-06-13 LAB — BRAIN NATRIURETIC PEPTIDE: B Natriuretic Peptide: 972 pg/mL — ABNORMAL HIGH (ref 0.0–100.0)

## 2019-06-13 LAB — SODIUM, URINE, RANDOM: Sodium, Ur: 31 mmol/L

## 2019-06-13 LAB — ECHOCARDIOGRAM COMPLETE
Height: 66 in
Weight: 2670.21 oz

## 2019-06-13 LAB — HIV ANTIBODY (ROUTINE TESTING W REFLEX): HIV Screen 4th Generation wRfx: NONREACTIVE

## 2019-06-13 MED ORDER — INSULIN ASPART 100 UNIT/ML ~~LOC~~ SOLN: 0.0000 [IU] | Freq: Three times a day (TID) | SUBCUTANEOUS | Status: DC

## 2019-06-13 MED ORDER — PROPOFOL 1000 MG/100ML IV EMUL
INTRAVENOUS | Status: AC
  Filled 2019-06-13: qty 100

## 2019-06-13 MED ORDER — IPRATROPIUM-ALBUTEROL 0.5-2.5 (3) MG/3ML IN SOLN
3.0000 mL | Freq: Four times a day (QID) | RESPIRATORY_TRACT | Status: DC
  Administered 2019-06-13 – 2019-06-21 (×32): 3 mL via RESPIRATORY_TRACT
  Filled 2019-06-13 (×32): qty 3

## 2019-06-13 MED ORDER — FENTANYL 2500MCG IN NS 250ML (10MCG/ML) PREMIX INFUSION
50.0000 ug/h | INTRAVENOUS | Status: DC
  Administered 2019-06-14 – 2019-06-15 (×2): 200 ug/h via INTRAVENOUS
  Administered 2019-06-16: 150 ug/h via INTRAVENOUS
  Administered 2019-06-16: 125 ug/h via INTRAVENOUS
  Administered 2019-06-18: 50 ug/h via INTRAVENOUS
  Administered 2019-06-19: 75 ug/h via INTRAVENOUS
  Administered 2019-06-20 (×2): 100 ug/h via INTRAVENOUS
  Filled 2019-06-13 (×6): qty 250

## 2019-06-13 MED ORDER — MIDAZOLAM HCL (PF) 10 MG/2ML IJ SOLN
INTRAMUSCULAR | Status: AC
  Administered 2019-06-13: 10 mg
  Filled 2019-06-13: qty 2

## 2019-06-13 MED ORDER — PROPOFOL 10 MG/ML IV BOLUS
INTRAVENOUS | Status: DC | PRN
  Administered 2019-06-13: 50 mg via INTRAVENOUS

## 2019-06-13 MED ORDER — ORAL CARE MOUTH RINSE
15.0000 mL | OROMUCOSAL | Status: DC
  Administered 2019-06-13 – 2019-06-21 (×68): 15 mL via OROMUCOSAL

## 2019-06-13 MED ORDER — MIDAZOLAM HCL 2 MG/2ML IJ SOLN: 2.0000 mg | INTRAMUSCULAR | Status: DC | PRN

## 2019-06-13 MED ORDER — FUROSEMIDE 10 MG/ML IJ SOLN
80.0000 mg | Freq: Once | INTRAMUSCULAR | Status: AC
  Administered 2019-06-13: 80 mg via INTRAVENOUS
  Filled 2019-06-13: qty 8

## 2019-06-13 MED ORDER — FENTANYL BOLUS VIA INFUSION
50.0000 ug | INTRAVENOUS | Status: DC | PRN
  Administered 2019-06-13 (×4): 50 ug via INTRAVENOUS
  Filled 2019-06-13: qty 50

## 2019-06-13 MED ORDER — PROPOFOL 10 MG/ML IV BOLUS: INTRAVENOUS | Status: DC | PRN

## 2019-06-13 MED ORDER — INSULIN GLARGINE 100 UNIT/ML ~~LOC~~ SOLN
15.0000 [IU] | SUBCUTANEOUS | Status: DC
  Administered 2019-06-13: 10:00:00 15 [IU] via SUBCUTANEOUS
  Filled 2019-06-13 (×2): qty 0.15

## 2019-06-13 MED ORDER — SUCCINYLCHOLINE CHLORIDE 20 MG/ML IJ SOLN
INTRAMUSCULAR | Status: DC | PRN
  Administered 2019-06-13: 100 mg via INTRAVENOUS

## 2019-06-13 MED ORDER — INSULIN ASPART 100 UNIT/ML ~~LOC~~ SOLN
3.0000 [IU] | SUBCUTANEOUS | Status: DC
  Administered 2019-06-13: 6 [IU] via SUBCUTANEOUS
  Administered 2019-06-13 (×2): 9 [IU] via SUBCUTANEOUS
  Administered 2019-06-14: 05:00:00 6 [IU] via SUBCUTANEOUS
  Administered 2019-06-14 – 2019-06-15 (×3): 3 [IU] via SUBCUTANEOUS

## 2019-06-13 MED ORDER — FENTANYL CITRATE (PF) 100 MCG/2ML IJ SOLN
INTRAMUSCULAR | Status: AC
  Administered 2019-06-13: 14:00:00 50 ug via INTRAVENOUS
  Filled 2019-06-13: qty 2

## 2019-06-13 MED ORDER — FENTANYL BOLUS VIA INFUSION
50.0000 ug | INTRAVENOUS | Status: DC | PRN
  Administered 2019-06-13 – 2019-06-20 (×22): 50 ug via INTRAVENOUS
  Filled 2019-06-13: qty 50

## 2019-06-13 MED ORDER — ORAL CARE MOUTH RINSE
15.0000 mL | Freq: Two times a day (BID) | OROMUCOSAL | Status: DC
  Administered 2019-06-13 (×2): 15 mL via OROMUCOSAL

## 2019-06-13 MED ORDER — IPRATROPIUM-ALBUTEROL 0.5-2.5 (3) MG/3ML IN SOLN
3.0000 mL | Freq: Four times a day (QID) | RESPIRATORY_TRACT | Status: DC | PRN
  Administered 2019-06-13: 3 mL via RESPIRATORY_TRACT
  Filled 2019-06-13: qty 3

## 2019-06-13 MED ORDER — SODIUM CHLORIDE 0.9 % IV SOLN
50.0000 ug/h | INTRAVENOUS | Status: DC
  Administered 2019-06-13: 14:00:00 50 ug/h via INTRAVENOUS
  Filled 2019-06-13 (×2): qty 50

## 2019-06-13 MED ORDER — ACETAMINOPHEN 650 MG RE SUPP
650.0000 mg | Freq: Four times a day (QID) | RECTAL | Status: DC | PRN
  Administered 2019-06-23: 650 mg via RECTAL
  Filled 2019-06-13: qty 1

## 2019-06-13 MED ORDER — ALBUTEROL SULFATE (2.5 MG/3ML) 0.083% IN NEBU
2.5000 mg | INHALATION_SOLUTION | RESPIRATORY_TRACT | Status: DC | PRN
  Administered 2019-06-23: 2.5 mg via RESPIRATORY_TRACT
  Filled 2019-06-13: qty 3

## 2019-06-13 MED ORDER — PANTOPRAZOLE SODIUM 40 MG IV SOLR
40.0000 mg | Freq: Every day | INTRAVENOUS | Status: DC
  Administered 2019-06-13 – 2019-06-14 (×2): 40 mg via INTRAVENOUS
  Filled 2019-06-13 (×2): qty 40

## 2019-06-13 MED ORDER — SODIUM CHLORIDE 0.9 % IV SOLN: INTRAVENOUS | Status: DC

## 2019-06-13 MED ORDER — ACETAMINOPHEN 325 MG PO TABS
650.0000 mg | ORAL_TABLET | Freq: Four times a day (QID) | ORAL | Status: DC | PRN
  Administered 2019-06-21: 650 mg via ORAL
  Filled 2019-06-13: qty 2

## 2019-06-13 MED ORDER — FENTANYL CITRATE (PF) 100 MCG/2ML IJ SOLN: 50.0000 ug | Freq: Once | INTRAMUSCULAR | Status: AC

## 2019-06-13 MED ORDER — CHLORHEXIDINE GLUCONATE 0.12% ORAL RINSE (MEDLINE KIT)
15.0000 mL | Freq: Two times a day (BID) | OROMUCOSAL | Status: DC
  Administered 2019-06-13 – 2019-06-29 (×31): 15 mL via OROMUCOSAL

## 2019-06-13 MED ORDER — MIDAZOLAM HCL 5 MG/5ML IJ SOLN
INTRAMUSCULAR | Status: DC | PRN
  Administered 2019-06-13: 2 mg via INTRAVENOUS

## 2019-06-13 MED ORDER — INSULIN ASPART 100 UNIT/ML ~~LOC~~ SOLN: 0.0000 [IU] | Freq: Every day | SUBCUTANEOUS | Status: DC

## 2019-06-13 MED ORDER — MIDAZOLAM HCL 2 MG/2ML IJ SOLN
2.0000 mg | INTRAMUSCULAR | Status: DC | PRN
  Administered 2019-06-14 – 2019-06-19 (×3): 2 mg via INTRAVENOUS
  Filled 2019-06-13 (×4): qty 2

## 2019-06-13 MED ORDER — FENTANYL 2500MCG IN NS 250ML (10MCG/ML) PREMIX INFUSION: 50.0000 ug/h | INTRAVENOUS | Status: DC

## 2019-06-13 NOTE — Progress Notes (Signed)
Per MD Memon, pt needs to be seen by MD Wert. Notified MD Wert in person of MD Memon's request, states he will see pt.

## 2019-06-13 NOTE — Anesthesia Procedure Notes (Addendum)
Procedure Name: Intubation Date/Time: 06/13/2019 1:29 PM Performed by: Denese Killings, MD Pre-anesthesia Checklist: Patient identified, Emergency Drugs available, Suction available and Patient being monitored Patient Re-evaluated:Patient Re-evaluated prior to induction Oxygen Delivery Method: Ambu bag Preoxygenation: Pre-oxygenation with 100% oxygen Induction Type: IV induction Laryngoscope Size: Glidescope Tube type: Oral Tube size: 7.5 mm Number of attempts: 2 Airway Equipment and Method: Stylet Placement Confirmation: ETT inserted through vocal cords under direct vision,  positive ETCO2 and breath sounds checked- equal and bilateral Secured at: 24 cm Tube secured with: Tape Dental Injury: Teeth and Oropharynx as per pre-operative assessment  Comments:  During first attempt ETT slipped into esophagus,  ETT removed and ventilated with Ambu bag and 100% oxygen,  intubated, atraumatic, no fluid in the esophagus, ETCO2 positive, breath sounds bilateral equal, endorsed to ICU team.

## 2019-06-13 NOTE — Consult Note (Signed)
PULMONARY / CRITICAL CARE MEDICINE   NAME:  Scott Mcgee, MRN:  [id number], DOB:  1961/03/30, LOS: 1 ADMISSION DATE:  06/12/2019, CONSULTATION DATE:  2/8 REFERRING MD:  [facility] , CHIEF COMPLAINT:  sob  BRIEF HISTORY:    28 yobm with  DM/HBP/prostate ca admit 2/8 with dysuria/low grade fever x 2 weeks PTA and high blood sugars/ trace Betahydroxybutyrate and low Na, rx with Insulin/ fluids admit to ICU on insulin drip/meropenem with increasing sob and abd distention > CT Abd s acute findings and pccm asked to see am 2/9.   HISTORY OF PRESENT ILLNESS   59 y.o. male with medical history significant of diabetes, hypertension, prostate cancer status post prostate resection, presents to the emergency room with a 2-week history of elevated blood sugars.  He reported that his sugars have been over 400.  He has been having intermittent fevers for the past few days.  He describes dysuria for the past 2 weeks.  No abdominal pain or flank pain.  He has had shortness of breath am of admit , but no cough.  He has been having hiccups intermittently for the past few weeks.  He has not had any vomiting or diarrhea.  No blurred vision.  He has had a distended abdomen, but wife reports that this is been present for several years now.  ED Course: He was evaluated emergency room where lab work showed that he was hyperglycemic, hyponatremic, elevated creatinine.  Urinalysis indicated possible infection.  CT of the abdomen pelvis performed showed questionable stranding around kidneys but otherwise no acute abnormalities.  He was started on insulin infusion as well as IV fluids and referred for admission.   SIGNIFICANT PAST MEDICAL HISTORY   AODM Prostate ca s/p prostatectomy  SIGNIFICANT EVENTS:    STUDIES:   CT abd  2/8  Cholelithiasis without complicating factors. Mild perinephric stranding is noted on the left without obstructive change. Underlying UTI could not be totally excluded. Mild bibasilar  atelectasis.     CULTURES:  Covid 19 PCR   2/8 neg  Resp viral panel 2/8 neg  MRSA PCR  2/8  Neg  BC x 2  2/8 >>> UC 2/8 >>>  ANTIBIOTICS:  Meropenem 2/8 >>>  LINES/TUBES:    CONSULTANTS:  PCCM    Scheduled Meds: . Chlorhexidine Gluconate Cloth  6 each Topical Daily  . heparin  5,000 Units Subcutaneous Q8H  . insulin aspart  0-15 Units Subcutaneous TID WC  . insulin aspart  0-5 Units Subcutaneous QHS  . insulin glargine  15 Units Subcutaneous Q24H  . mouth rinse  15 mL Mouth Rinse BID  . polyethylene glycol  17 g Oral Daily   Continuous Infusions: . insulin 3 mL/hr at 06/13/19 0900  . meropenem [facility]) IV Stopped (06/13/19 4585)   PRN Meds:.acetaminophen, dextrose, ipratropium-albuterol   SUBJECTIVE:  Denies sob or pain and wants to eat   CONSTITUTIONAL: BP 122/74   Pulse 98   Temp 98.1 F (36.7 C) (Oral)   Resp (!) 39   Ht 5\' 6"  (1.676 m)   Wt 75.7 kg   SpO2 94%   BMI 26.94 kg/m   I/O last 3 completed shifts: In: 1254.1 [I.V.:838.6; IV Piggyback:415.6] Out: 100 [Urine:100]    Intake/Output Summary (Last 24 hours) at 06/13/2019 1057 Last data filed at 06/13/2019 0900 Gross per 24 hour  Intake 2778.18 ml  Output 100 ml  Net 2678.18 ml          PHYSICAL EXAM: General:  Obese wm somewhat grunting respirations Neuro:  Alert/ approp HEENT:  Mucosa dry Cardiovascular:  RRR no s Lungs: lungs with distant bs, no wheezing  Abdomen:  Mod distended, poor excursion Musculoskeletal:  No deformites/ warm feet Skin:  Burn scars both legs    RESOLVED PROBLEM LIST    ASSESSMENT AND PLAN   1)  Acute on chronic renal failure in setting of longstanding aodm (? No longer on metformin? ) and uti with possible sepsis/ vol depletion.  >>> rec NS/ check Urine na, osm, need central line for cvp monitoring and foley for uop  >>> if not increase uop over never 24 h will need renal to see   2)  Hyponatremia/ ? On hctz as outpt/  is not all due to high glucose   and suggests vol depletion despite positive I/0s since admit  >>> rec continue vol expand with NS  3)  UTI / sepsis >  rx per [facility] approp, note fever on admit has resolved   4) acute hypoxemic resp failure  He has some grunting resp efforts due to obesity more than lung dz and for now 02 dep >> try to get in sitting positin >> avoid bipap if possible as will likely just make abd even more swollen  SUMMARY OF TODAY'S PLAN:   volume expand and w/u oliguric acute on chronic renal failure   Best Practice / Goals of Care / Disposition.   DVT PROPHYLAXIS:  Hep sq  SUP: Protonix  NUTRITION:npo MOBILITY: up as tol  GOALS OF CARE: full code FAMILY DISCUSSIONS: per TRiad  DISPOSITION hopefully home   LABS  Glucose Recent Labs  Lab 06/13/19 0019 06/13/19 0157 06/13/19 0354 06/13/19 0548 06/13/19 0711 06/13/19 0855  GLUCAP 151* 181* 178* 160* 168* 175*    BMET Recent Labs  Lab 06/12/19 2210 06/13/19 0249 06/13/19 0648  NA 124* 123* 123*  K 4.0 4.5 4.0  CL 88* 88* 88*  CO2 21* 21* 21*  BUN 48* 50* 52*  CREATININE 3.52* 3.41* 3.54*  GLUCOSE 172* 179* 163*    Liver Enzymes Recent Labs  Lab 06/12/19 0927  AST 54*  ALT 47*  ALKPHOS 285*  BILITOT 2.2*  ALBUMIN 3.5    Electrolytes Recent Labs  Lab 06/12/19 2210 06/13/19 0249 06/13/19 0648  CALCIUM 8.8* 8.8* 8.6*    CBC Recent Labs  Lab 06/12/19 0927 06/12/19 1854  WBC 18.8* 14.7*  HGB 11.3* 10.5*  HCT 34.8* 31.9*  PLT 224 202    ABG Recent Labs  Lab 06/13/19 0751  PHART 7.359  PCO2ART 40.8  PO2ART 70.5*    Coag's Recent Labs  Lab 06/12/19 1854  APTT 45*  INR 1.5*    Sepsis Markers Recent Labs  Lab 06/12/19 1907 06/12/19 2210  LATICACIDVEN 3.6* 2.7*    Cardiac Enzymes No results for input(s): TROPONINI, PROBNP in the last 168 hours.  PAST MEDICAL HISTORY :   He  has a past medical history of Bipolar 1 disorder ([place]), Diabetes mellitus without complication ([place]), Elevated  cholesterol, Glaucoma, Hypertension, and Prostate cancer ([place]).  PAST SURGICAL HISTORY:  He  has a past surgical history that includes Prostate biopsy; Robot assisted laparoscopic radical prostatectomy (N/A, 06/16/2017); and Lymphadenectomy (Bilateral, 06/16/2017).  Allergies  Allergen Reactions  . Clotrimazole 3 [Clotrimazole] Hives    cream    No current facility-administered medications on file prior to encounter.   Current Outpatient Medications on File Prior to Encounter - - NOTE:   Unable to verify as  accurately reflecting what pt takes     Medication Sig  . acetaminophen (TYLENOL) 500 MG tablet Take 500 mg by mouth every 6 (six) hours as needed for mild pain or moderate pain.  Marland Kitchen AMLODIPINE BESYLATE PO Take 10 mg by mouth every morning.  Marland Kitchen atorvastatin (LIPITOR) 20 MG tablet Take 20 mg by mouth daily.   . hydrochlorothiazide (HYDRODIURIL) 25 MG tablet Take 25 mg by mouth daily.   . metFORMIN (GLUCOPHAGE) 500 MG tablet Reported by pt/ wife no longer taking/ no PCP  . multivitamin-iron-minerals-folic acid (CENTRUM) chewable tablet Chew 1 tablet by mouth daily.  . polyethylene glycol (MIRALAX / GLYCOLAX) 17 g packet Take 17 g by mouth daily.  Marland Kitchen HYDROcodone-acetaminophen (NORCO) 5-325 MG tablet Take 1-2 tablets by mouth every 6 (six) hours as needed for moderate pain or severe pain. (Patient not taking: Reported on 06/12/2019)  . senna-docusate (SENOKOT-S) 8.6-50 MG tablet Take 1 tablet by mouth 2 (two) times daily. While taking pain meds to prevent constipation (Patient not taking: Reported on 06/12/2019)    FAMILY HISTORY:   His family history includes Cancer in his mother.  SOCIAL HISTORY:  He  reports that he has never smoked. He has never used smokeless tobacco. He reports that he does not drink alcohol or use drugs.     The patient is critically ill with multiple organ systems failure and requires high complexity decision making for assessment and support, frequent evaluation and  titration of therapies, application of advanced monitoring technologies and extensive interpretation of multiple databases. Critical Care Time devoted to patient care services described in this note is 60  minutes.     Christinia Gully, MD Pulmonary and [place] [phone] After 5:30 PM or weekends, use Beeper [phone]

## 2019-06-13 NOTE — Progress Notes (Signed)
Pt arrived from [facility].  On vent, sedation.  Noted to have distended abdomen with increase tympany.    Will have OG tube to suction and check portable abdominal xray.  Adjustments made to sedation and sliding scale insulin.  Continue current Abx and IV fluids.  Vent settings and ABG reviewed.  Will f/u BMET q6hrs and adjust fluids as needed.  CC time 31 minutes.  Chesley Mires, MD [facility] Pulmonary/Critical Care 06/13/2019, 5:35 PM

## 2019-06-13 NOTE — Progress Notes (Signed)
Upon entering room pt is sitting upright at edge of bed with increased WOB, tachypnea, shallow respirations. Pt states he feels "good", denies SOB. Pt's wife is visibly concerned. MD Memon notified to come to bedside. Respiratory at bedside.

## 2019-06-13 NOTE — Progress Notes (Addendum)
Was called to evaluate the patient for tachypnea.  On my arrival, patient is sitting up in bed.  He is audibly wheezing.  Respiratory rate in the 40s.  Appears uncomfortable.  Crackles on exam bilaterally.  He does not have any significant lower extremity mid.  Abdomen remains distended.  Patient is having trouble completing a sentence.  Case reviewed with Dr. Melvyn Novas on-call for PCCM.  Recommendations were to go ahead and intubate the patient to stabilize her respiratory status.  Anesthesia has been called to intubate patient and vent orders have been placed.  Further recommendations including placement of central line for CVP monitoring.  I have discussed the case with Dr. Constance Haw with general surgery who has agreed to place a central line.  With patient's multiorgan dysfunction, sepsis, respiratory failure, acute kidney injury with possible need for dialysis, Dr. Melvyn Novas has recommended that patient be transferred to [facility] for further evaluation.  I have discussed his care with Ms. Noe Gens, NP, on-call for PCCM at [facility] who is aware of the patient and accepted transfer on behalf of Dr. Halford Chessman.  [facility] was also informed of patient transfer.  I discussed these recommendations with the patient's wife who is also in agreement for transferring patient.  Critical care procedure note Authorized and performed by: Kathie Dike Total critical care time: Approximately 40 minutes Due to high probability of clinically significant, life-threatening deterioration, the patient required my highest level of preparedness to intervene emergently and I personally spent this critical care time directly and personally managing the patient.  The critical care time included obtaining a history, examining the patient, pulse oximetry, ordering and review of studies, arranging urgent treatment with development of a management plan, evaluation of patient's response to treatment, frequent reassessment,  discussions with other providers.  Critical care time was performed to assess and manage the high probability of imminent, life-threatening deterioration that could result in multiorgan failure.  It was exclusive of separate billable procedures and treating other patients and teaching time.  Please see MDM section and the rest of the of note for further information on patient assessment and treatment   [employer]

## 2019-06-13 NOTE — Progress Notes (Signed)
Pt continues to have episodes of tachypnea. Have attempted repositioning with little help. Ask RT to look in on patient. Pt currently receiving a breathing treatment. Audible wheezing but appears to be more in his throat area than his lungs. ABD taut and distended. Pt still on 2L O2 via [place]

## 2019-06-13 NOTE — Progress Notes (Signed)
Pt has not had any urine output since IV Lasix two hours ago, denies urge to urinate. Bladder scanner showing 69mL. Pt appears to be tiring at this time and is agreeable to bi-pap. Respiratory and MD Memon notified.

## 2019-06-13 NOTE — Progress Notes (Signed)
Per MD Memon pt can be placed on Bi-pap if increased WOB or pt begins to fatigue. Educated pt on this option and to notify RN if he feels increased SOB or fatigue. Pt denied use of bi-pap at this time.

## 2019-06-13 NOTE — Progress Notes (Signed)
PROGRESS NOTE    Scott Mcgee  ELF:[id number] DOB: Dec 17, 1960 DOA: 06/12/2019 PCP: [facility]    Brief Narrative:  59 year old male with a history of hypertension, diabetes, presents to the emergency room with a 2-week history of elevated blood sugars over 400.  He was having intermittent fevers and dysuria.  He was noted to have possible sepsis of pyelonephritis.  He also had severe hyperglycemia and acute kidney injury.  He was admitted to the stepdown unit for further management.   Assessment & Plan:   Active Problems:   Type 2 diabetes mellitus with hyperosmolar hyperglycemic state (HHS) (HCC)   Hyponatremia   HTN (hypertension)   AKI (acute kidney injury) ([place])   Abdominal distention   Alcohol use   Sepsis ([place])   Acute pyelonephritis   1. Sepsis secondary to acute pyelonephritis.  Patient presented with fever, leukocytosis and tachycardia.  Blood cultures have been ordered.  He has a history of antibiotic resistant urinary tract infections.  Currently on meropenem.  Urine culture in process.  Lactic acid was elevated at 3.6, improved to 2.7 with IV fluids.  Hemodynamics are currently stable. 2. Acute respiratory failure with hypoxia.  Patient increasingly tachypneic and required supplemental oxygen.  ABG done today shows that pH is normal, PCO2 is 40, PO2 of 70 on 3 L.  Chest x-ray reviewed and shows developing pulmonary edema.  Will give a dose of IV Lasix.  Check BNP.  If tachypnea persist, may need to consider BiPAP. 3. Uncontrolled diabetes with hyperosmolar, hyperglycemic state.  Initial blood sugars over 500 on arrival.  Patient was treated with intravenous insulin and IV fluids.  Blood sugars have since improved.  Will transition to Lantus.  A1c in process. 4. Acute kidney injury.  Baseline creatinine is slightly new normal.  Last creatinine from 11/2008 noted to be 0.9.  No evidence of obstruction on CT imaging.  He has been receiving IV fluids  with minimal improvement.  Will give a dose of Lasix.  May need nephrology input if he fails to improve. 5. Hyponatremia.  Initially felt to be secondary to pseudohyponatremia in the setting of severe hypoglycemia.  Although serum sodium has improved, he continues to be hyponatremic.  He is now showing some signs of volume overload.  We will give a dose of Lasix.  Check urine and serum osmolarity.  Check urine sodium. 6. Abdominal distention.  Per family, appears to be a chronic issue.  CT abdomen did not show any acute findings.  No ascites was reported.  Patient reports having bowel movements. 7. Hypertension.  Blood pressure was initially low on arrival.  Holding amlodipine for now. 8. Alcohol use.  Patient reports has not had any alcohol in the past 2 weeks.  No signs of alcohol withdrawal at this time.   DVT prophylaxis: Heparin Code Status: Full code Family Communication: discussed with wife at the bedside Disposition Plan: Patient will need continued inpatient stay since he is currently unstable with acute respiratory failure with tachypnea requiring supplemental oxygen, acute renal failure and needs further inpatient management.   Consultants:     Procedures:     Antimicrobials:   Meropenem 2/8 >   Subjective: Patient is more short of breath this morning.  He was placed on oxygen last night.  Abdomen remains distended.  Objective: Vitals:   06/13/19 0730 06/13/19 0745 06/13/19 0800 06/13/19 0815  BP: (!) 153/77 (!) 156/82 (!) 152/86 (!) 147/75  Pulse: (!) 102 Marland Kitchen)  101 (!) 102 (!) 103  Resp: (!) 44 (!) 37 (!) 38 (!) 28  Temp:      TempSrc:      SpO2: 95% 95% 95% 95%  Weight:      Height:        Intake/Output Summary (Last 24 hours) at 06/13/2019 0830 Last data filed at 06/13/2019 0749 Gross per 24 hour  Intake 2699.06 ml  Output 100 ml  Net 2599.06 ml   Filed Weights   06/12/19 0859 06/12/19 1608  Weight: 74.8 kg 75.7 kg    Examination:  General exam: appears  uncomfortable due to work of breathing Respiratory system: bilateral crackles with wheezing. Increased respiratory effort Cardiovascular system: S1 & S2 heard, RRR. No JVD, murmurs, rubs, gallops or clicks. No pedal edema. Gastrointestinal system: Abdomen is distended, soft and nontender. No organomegaly or masses felt. Normal bowel sounds heard. Central nervous system: Alert and oriented. No focal neurological deficits. Extremities: Symmetric 5 x 5 power. Skin: No rashes, lesions or ulcers Psychiatry: Judgement and insight appear normal. Mood & affect appropriate.     Data Reviewed: I have personally reviewed following labs and imaging studies  CBC: Recent Labs  Lab 06/12/19 0927 06/12/19 1854  WBC 18.8* 14.7*  NEUTROABS 15.2*  --   HGB 11.3* 10.5*  HCT 34.8* 31.9*  MCV 90.2 90.6  PLT 224 836   Basic Metabolic Panel: Recent Labs  Lab 06/12/19 1253 06/12/19 1854 06/12/19 2210 06/13/19 0249 06/13/19 0648  NA 122* 126* 124* 123* 123*  K 4.1 3.7 4.0 4.5 4.0  CL 82* 87* 88* 88* 88*  CO2 23 23 21* 21* 21*  GLUCOSE 398* 175* 172* 179* 163*  BUN 41* 47* 48* 50* 52*  CREATININE 3.49* 3.61* 3.52* 3.41* 3.54*  CALCIUM 9.9 9.5 8.8* 8.8* 8.6*   GFR: Estimated Creatinine Clearance: 19.8 mL/min (A) (by C-G formula based on SCr of 3.54 mg/dL (H)). Liver Function Tests: Recent Labs  Lab 06/12/19 0927  AST 54*  ALT 47*  ALKPHOS 285*  BILITOT 2.2*  PROT 7.4  ALBUMIN 3.5   Recent Labs  Lab 06/12/19 0927  LIPASE 25   No results for input(s): AMMONIA in the last 168 hours. Coagulation Profile: Recent Labs  Lab 06/12/19 1854  INR 1.5*   Cardiac Enzymes: No results for input(s): CKTOTAL, CKMB, CKMBINDEX, TROPONINI in the last 168 hours. BNP (last 3 results) No results for input(s): PROBNP in the last 8760 hours. HbA1C: No results for input(s): HGBA1C in the last 72 hours. CBG: Recent Labs  Lab 06/13/19 0019 06/13/19 0157 06/13/19 0354 06/13/19 0548  06/13/19 0711  GLUCAP 151* 181* 178* 160* 168*   Lipid Profile: No results for input(s): CHOL, HDL, LDLCALC, TRIG, CHOLHDL, LDLDIRECT in the last 72 hours. Thyroid Function Tests: No results for input(s): TSH, T4TOTAL, FREET4, T3FREE, THYROIDAB in the last 72 hours. Anemia Panel: No results for input(s): VITAMINB12, FOLATE, FERRITIN, TIBC, IRON, RETICCTPCT in the last 72 hours. Sepsis Labs: Recent Labs  Lab 06/12/19 1907 06/12/19 2210  LATICACIDVEN 3.6* 2.7*    Recent Results (from the past 240 hour(s))  SARS CORONAVIRUS 2 (TAT 6-24 HRS) Nasopharyngeal Nasopharyngeal Swab     Status: None   Collection Time: 06/12/19 10:54 AM   Specimen: Nasopharyngeal Swab  Result Value Ref Range Status   SARS Coronavirus 2 NEGATIVE NEGATIVE Final    Comment: (NOTE) SARS-CoV-2 target nucleic acids are NOT DETECTED. The SARS-CoV-2 RNA is generally detectable in upper and lower respiratory specimens during the  acute phase of infection. Negative results do not preclude SARS-CoV-2 infection, do not rule out co-infections with other pathogens, and should not be used as the sole basis for treatment or other patient management decisions. Negative results must be combined with clinical observations, patient history, and epidemiological information. The expected result is Negative. Fact Sheet for Patients: SugarRoll.be Fact Sheet for Healthcare Providers: https://www.woods-mathews.com/ This test is not yet approved or cleared by the [place] FDA and  has been authorized for detection and/or diagnosis of SARS-CoV-2 by FDA under an Emergency Use Authorization (EUA). This EUA will remain  in effect (meaning this test can be used) for the duration of the COVID-19 declaration under Section 56 4(b)(1) of the Act, 21 U.S.C. section 360bbb-3(b)(1), unless the authorization is terminated or revoked sooner. Performed at [facility], [place] [address],  [place], [place] [address]   Respiratory Panel by RT PCR (Flu A&B, Covid) - Nasopharyngeal Swab     Status: None   Collection Time: 06/12/19  1:03 PM   Specimen: Nasopharyngeal Swab  Result Value Ref Range Status   SARS Coronavirus 2 by RT PCR NEGATIVE NEGATIVE Final    Comment: (NOTE) SARS-CoV-2 target nucleic acids are NOT DETECTED. The SARS-CoV-2 RNA is generally detectable in upper respiratoy specimens during the acute phase of infection. The lowest concentration of SARS-CoV-2 viral copies this assay can detect is 131 copies/mL. A negative result does not preclude SARS-Cov-2 infection and should not be used as the sole basis for treatment or other patient management decisions. A negative result may occur with  improper specimen collection/handling, submission of specimen other than nasopharyngeal swab, presence of viral mutation(s) within the areas targeted by this assay, and inadequate number of viral copies (<131 copies/mL). A negative result must be combined with clinical observations, patient history, and epidemiological information. The expected result is Negative. Fact Sheet for Patients:  PinkCheek.be Fact Sheet for Healthcare Providers:  GravelBags.it This test is not yet ap proved or cleared by the [place] FDA and  has been authorized for detection and/or diagnosis of SARS-CoV-2 by FDA under an Emergency Use Authorization (EUA). This EUA will remain  in effect (meaning this test can be used) for the duration of the COVID-19 declaration under Section 564(b)(1) of the Act, 21 U.S.C. section 360bbb-3(b)(1), unless the authorization is terminated or revoked sooner.    Influenza A by PCR NEGATIVE NEGATIVE Final   Influenza B by PCR NEGATIVE NEGATIVE Final    Comment: (NOTE) The Xpert Xpress SARS-CoV-2/FLU/RSV assay is intended as an aid in  the diagnosis of influenza from Nasopharyngeal swab specimens and  should  not be used as a sole basis for treatment. Nasal washings and  aspirates are unacceptable for Xpert Xpress SARS-CoV-2/FLU/RSV  testing. Fact Sheet for Patients: PinkCheek.be Fact Sheet for Healthcare Providers: GravelBags.it This test is not yet approved or cleared by the [place] FDA and  has been authorized for detection and/or diagnosis of SARS-CoV-2 by  FDA under an Emergency Use Authorization (EUA). This EUA will remain  in effect (meaning this test can be used) for the duration of the  Covid-19 declaration under Section 564(b)(1) of the Act, 21  U.S.C. section 360bbb-3(b)(1), unless the authorization is  terminated or revoked. Performed at [facility], [address], [place], [place] [address]   MRSA PCR Screening     Status: None   Collection Time: 06/12/19  4:28 PM   Specimen: Nasal Mucosa; Nasopharyngeal  Result Value Ref Range Status  MRSA by PCR NEGATIVE NEGATIVE Final    Comment:        The GeneXpert MRSA Assay (FDA approved for NASAL specimens only), is one component of a comprehensive MRSA colonization surveillance program. It is not intended to diagnose MRSA infection nor to guide or monitor treatment for MRSA infections. Performed at [facility], [address] [place], [place] [address]   Culture, blood (x 2)     Status: None (Preliminary result)   Collection Time: 06/12/19  7:06 PM   Specimen: BLOOD  Result Value Ref Range Status   Specimen Description BLOOD  Final   Special Requests NONE  Final   Culture   Final    NO GROWTH < 12 HOURS Performed at [facility], [address] [place], [place] [address]    Report Status PENDING  Incomplete  Culture, blood (x 2)     Status: None (Preliminary result)   Collection Time: 06/12/19  7:07 PM   Specimen: BLOOD  Result Value Ref Range Status   Specimen Description BLOOD  Final   Special Requests NONE  Final   Culture   Final    NO GROWTH < 12  HOURS Performed at [facility], [address], [place], [place] [address]    Report Status PENDING  Incomplete         Radiology Studies: CT ABDOMEN PELVIS WO CONTRAST  Result Date: 06/12/2019 CLINICAL DATA:  Abdominal pain and discomfort for 1 week EXAM: CT ABDOMEN AND PELVIS WITHOUT CONTRAST TECHNIQUE: Multidetector CT imaging of the abdomen and pelvis was performed following the standard protocol without IV contrast. COMPARISON:  None. FINDINGS: Lower chest: Mild atelectatic changes are noted in the bases bilaterally. No sizable effusion is seen. Hepatobiliary: Gallbladder is decompressed with multiple small gallstones within. The liver demonstrates mild decreased attenuation consistent with fatty infiltration. Pancreas: Unremarkable. No pancreatic ductal dilatation or surrounding inflammatory changes. Spleen: Normal in size without focal abnormality. Adrenals/Urinary Tract: Adrenal glands are within normal limits. Kidneys demonstrate no evidence of renal calculi or urinary tract obstructive change. Mild perinephric stranding is noted on the left. Underlying urinary tract infection could not be totally excluded. Correlation with lab values is recommended. The bladder is decompressed. Stomach/Bowel: The appendix is within normal limits. No obstructive or inflammatory changes of the bowel are seen. The small bowel and stomach are unremarkable. Vascular/Lymphatic: Aortic atherosclerosis. No enlarged abdominal or pelvic lymph nodes. Reproductive: Prostate has been surgically removed. Other: No abdominal wall hernia or abnormality. No abdominopelvic ascites. Musculoskeletal: No acute bony abnormality is noted. A large exostosis is noted arising from the posterior aspect of the right iliac wing. IMPRESSION: Cholelithiasis without complicating factors. Mild perinephric stranding is noted on the left without obstructive change. Underlying UTI could not be totally excluded. Mild bibasilar atelectasis. Chronic  appearing changes as described above. Electronically Signed   By: Inez Catalina M.D.   On: 06/12/2019 11:19   DG Chest Portable 1 View  Result Date: 06/13/2019 CLINICAL DATA:  Increased shortness of breath, diabetes mellitus, hypertension EXAM: PORTABLE CHEST 1 VIEW COMPARISON:  Portable exam 0738 hours compared to 06/12/2019 FINDINGS: Normal heart size and pulmonary vascularity. Chronic opacification of the LEFT costophrenic angle by enlarged epicardial fat pad. Bibasilar atelectasis, cannot completely exclude developing infiltrate in LEFT lower lobe. Upper lungs clear. No definite pleural effusion or pneumothorax. No acute osseous findings. IMPRESSION: Bibasilar atelectasis, cannot exclude developing infiltrate in LEFT lower lobe. Electronically Signed   By: Lavonia Dana M.D.   On:  06/13/2019 08:06   DG Chest Portable 1 View  Result Date: 06/12/2019 CLINICAL DATA:  Diabetic ketoacidosis. EXAM: PORTABLE CHEST 1 VIEW COMPARISON:  CT scan of the abdomen dated 06/12/2019 FINDINGS: The heart size and pulmonary vascularity are normal. There is increased density at the left base laterally due to a prominent pericardial fat pad as demonstrated on the CT scan of the abdomen dated 06/12/2019. Slight atelectasis at the lung bases. No acute bone abnormality. IMPRESSION: Minimal bibasilar atelectasis. Electronically Signed   By: Lorriane Shire M.D.   On: 06/12/2019 18:42        Scheduled Meds: . Chlorhexidine Gluconate Cloth  6 each Topical Daily  . furosemide  80 mg Intravenous Once  . heparin  5,000 Units Subcutaneous Q8H  . insulin aspart  0-15 Units Subcutaneous TID WC  . insulin aspart  0-5 Units Subcutaneous QHS  . insulin glargine  15 Units Subcutaneous Q24H  . mouth rinse  15 mL Mouth Rinse BID  . polyethylene glycol  17 g Oral Daily   Continuous Infusions: . insulin 3 mL/hr at 06/13/19 0749  . meropenem (MERREM) IV Stopped (06/13/19 2197)     LOS: 1 day    Time spent:  82mins    Kathie Dike, MD [facility]   If 7PM-7AM, please contact night-coverage [email or web address]  06/13/2019, 8:30 AM

## 2019-06-13 NOTE — Progress Notes (Signed)
Notified MD Memon that pt has had increased RR all night in the 30s and 40s. Placed on 3L HFNC and given PRN breathing tx without improvement in WOB. O2 sats are 95% and current RR 45. Pt endorses SOB. Orders for ABG and x-ray given, respiratory called.

## 2019-06-13 NOTE — Progress Notes (Signed)
1322-RT, Anesthesia, RN x 3 at bedside for intubation 1323-2 mg versed IV given 1324-50 mg IV Propofol given 1324-100 mg succinylcholine IV given 1329- 7.5 ETT placedat 24cm/lip via glidescope by anesthesia. Positive color change on CO2 detector, breath sounds auscultated bilaterally 1329- 2 mg Versed IV given 1330- 30 mg Propofol IV given 1331-2 mg versed IV given  24F OG placed, air bolus auscultated over epigastric area, stomach contents returned  Stat Portable Chest X-ray ordered

## 2019-06-13 NOTE — Procedures (Signed)
Procedure Note  06/13/19    Preoperative Diagnosis: Respiratory failure, sepsis, Acute kidney injury, needing central venous pressure monitoring    Postoperative Diagnosis: Same   Procedure(s) Performed: Central Line placement, right internal jugular    Surgeon: Lanell Matar. Constance Haw, MD   Assistants: None   Anesthesia: 1% lidocaine    Complications: None    Indications: Scott Mcgee is a 59 y.o. with worsening respiratory failure and sepsis with multi system dysfunction. I discussed the risk and benefits of placement of the central line with his wife, including but not limited to bleeding, infection, and risk of pneumothorax. She has given consent for the procedure.    Procedure: The patient placed supine. The right chest and neck was prepped and draped in the usual sterile fashion.  Wearing full gown and gloves, I performed the procedure.  One percent lidocaine was used for local anesthesia.  I attempted a right subclavian first given the intubation, but given his history and INR 1.5, I stuck this area once without success.  An ultrasound was utilized to assess the jugular vein.  The needle with syringe was advanced into the vein with dark venous return, and a wire was placed using the Seldinger technique without difficulty.  Ectopia was noted and the wire was pulled back.  The skin was knicked and a dilator was placed, and the three lumen catheter was placed over the wire with continued control of the wire.  There was good draw back of blood from all three lumens and each flushed easily with saline.  The catheter was secured in 4 points with 2-0 silk and a biopatch and dressing was placed.     The patient tolerated the procedure well, and the CXR was ordered to confirm position of the central line.   Scott Labrum, MD [facility] [address] [place], [place] [address] [phone] (office)

## 2019-06-13 NOTE — Progress Notes (Signed)
Changed patient to 3L HFNC. Still tachypnic. Passed on to day shift to please have him looked at as soon as possible.

## 2019-06-13 NOTE — Progress Notes (Signed)
  Echocardiogram 2D Echocardiogram has been performed.  Scott Mcgee 06/13/2019, 11:41 AM

## 2019-06-14 ENCOUNTER — Inpatient Hospital Stay (HOSPITAL_COMMUNITY): Payer: Medicare Other

## 2019-06-14 DIAGNOSIS — J9601 Acute respiratory failure with hypoxia: Secondary | ICD-10-CM

## 2019-06-14 LAB — BASIC METABOLIC PANEL
Anion gap: 13 (ref 5–15)
Anion gap: 12 (ref 5–15)
BUN: 54 mg/dL — ABNORMAL HIGH (ref 8–23)
BUN: 58 mg/dL — ABNORMAL HIGH (ref 8–23)
CO2: 19 mmol/L — ABNORMAL LOW (ref 22–32)
CO2: 18 mmol/L — ABNORMAL LOW (ref 22–32)
Calcium: 7.9 mg/dL — ABNORMAL LOW (ref 8.9–10.3)
Calcium: 8 mg/dL — ABNORMAL LOW (ref 8.9–10.3)
Chloride: 92 mmol/L — ABNORMAL LOW (ref 98–111)
Chloride: 96 mmol/L — ABNORMAL LOW (ref 98–111)
Creatinine, Ser: 3.96 mg/dL — ABNORMAL HIGH (ref 0.61–1.24)
Creatinine, Ser: 3.82 mg/dL — ABNORMAL HIGH (ref 0.61–1.24)
GFR calc Af Amer: 18 mL/min — ABNORMAL LOW (ref >60)
GFR calc Af Amer: 19 mL/min — ABNORMAL LOW (ref >60)
GFR calc non Af Amer: 15 mL/min — ABNORMAL LOW (ref >60)
GFR calc non Af Amer: 16 mL/min — ABNORMAL LOW (ref >60)
Glucose, Bld: 175 mg/dL — ABNORMAL HIGH (ref 70–99)
Glucose, Bld: 143 mg/dL — ABNORMAL HIGH (ref 70–99)
Potassium: 4.2 mmol/L (ref 3.5–5.1)
Potassium: 4 mmol/L (ref 3.5–5.1)
Sodium: 124 mmol/L — ABNORMAL LOW (ref 135–145)
Sodium: 126 mmol/L — ABNORMAL LOW (ref 135–145)

## 2019-06-14 LAB — GLUCOSE, CAPILLARY
Glucose-Capillary: 170 mg/dL — ABNORMAL HIGH (ref 70–99)
Glucose-Capillary: 136 mg/dL — ABNORMAL HIGH (ref 70–99)
Glucose-Capillary: 73 mg/dL (ref 70–99)
Glucose-Capillary: 77 mg/dL (ref 70–99)
Glucose-Capillary: 84 mg/dL (ref 70–99)
Glucose-Capillary: 112 mg/dL — ABNORMAL HIGH (ref 70–99)
Glucose-Capillary: 139 mg/dL — ABNORMAL HIGH (ref 70–99)

## 2019-06-14 LAB — PHOSPHORUS: Phosphorus: 5.3 mg/dL — ABNORMAL HIGH (ref 2.5–4.6)

## 2019-06-14 LAB — CBC
HCT: 28 % — ABNORMAL LOW (ref 39.0–52.0)
Hemoglobin: 9 g/dL — ABNORMAL LOW (ref 13.0–17.0)
MCH: 29.4 pg (ref 26.0–34.0)
MCHC: 32.1 g/dL (ref 30.0–36.0)
MCV: 91.5 fL (ref 80.0–100.0)
Platelets: 193 10*3/uL (ref 150–400)
RBC: 3.06 MIL/uL — ABNORMAL LOW (ref 4.22–5.81)
RDW: 13.7 % (ref 11.5–15.5)
WBC: 12.2 10*3/uL — ABNORMAL HIGH (ref 4.0–10.5)
nRBC: 0 % (ref 0.0–0.2)

## 2019-06-14 LAB — MAGNESIUM: Magnesium: 2.1 mg/dL (ref 1.7–2.4)

## 2019-06-14 MED ORDER — PHENYLEPHRINE HCL-NACL 10-0.9 MG/250ML-% IV SOLN
0.0000 ug/min | INTRAVENOUS | Status: DC
  Administered 2019-06-14: 25 ug/min via INTRAVENOUS
  Administered 2019-06-14: 20 ug/min via INTRAVENOUS
  Administered 2019-06-14: 10 ug/min via INTRAVENOUS
  Administered 2019-06-15: 30 ug/min via INTRAVENOUS
  Administered 2019-06-15: 22 ug/min via INTRAVENOUS
  Administered 2019-06-16: 10 ug/min via INTRAVENOUS
  Filled 2019-06-14 (×6): qty 250

## 2019-06-14 MED ORDER — PANTOPRAZOLE SODIUM 40 MG IV SOLR
40.0000 mg | Freq: Two times a day (BID) | INTRAVENOUS | Status: DC
  Administered 2019-06-14 – 2019-06-15 (×3): 40 mg via INTRAVENOUS
  Filled 2019-06-14 (×3): qty 40

## 2019-06-14 MED ORDER — POLYETHYLENE GLYCOL 3350 17 G PO PACK
17.0000 g | PACK | Freq: Every day | ORAL | Status: DC
  Administered 2019-06-14: 12:00:00 17 g via ORAL
  Filled 2019-06-14 (×2): qty 1

## 2019-06-14 MED ORDER — DEXMEDETOMIDINE HCL IN NACL 400 MCG/100ML IV SOLN
0.4000 ug/kg/h | INTRAVENOUS | Status: DC
  Administered 2019-06-14: 20:00:00 1 ug/kg/h via INTRAVENOUS
  Administered 2019-06-14: 0.6 ug/kg/h via INTRAVENOUS
  Administered 2019-06-14: 16:00:00 1.8 ug/kg/h via INTRAVENOUS
  Administered 2019-06-15: 02:00:00 1 ug/kg/h via INTRAVENOUS
  Administered 2019-06-15 (×6): 1.8 ug/kg/h via INTRAVENOUS
  Administered 2019-06-16: 1.3 ug/kg/h via INTRAVENOUS
  Administered 2019-06-16 (×2): 1.8 ug/kg/h via INTRAVENOUS
  Administered 2019-06-16: 09:00:00 1.6 ug/kg/h via INTRAVENOUS
  Administered 2019-06-16: 23:00:00 1.3 ug/kg/h via INTRAVENOUS
  Administered 2019-06-16: 14:00:00 1.5 ug/kg/h via INTRAVENOUS
  Administered 2019-06-17: 0.8 ug/kg/h via INTRAVENOUS
  Administered 2019-06-17: 1.3 ug/kg/h via INTRAVENOUS
  Administered 2019-06-17: 0.7 ug/kg/h via INTRAVENOUS
  Administered 2019-06-18 (×3): 0.8 ug/kg/h via INTRAVENOUS
  Administered 2019-06-19: 21:00:00 1.2 ug/kg/h via INTRAVENOUS
  Administered 2019-06-19 (×2): 1 ug/kg/h via INTRAVENOUS
  Administered 2019-06-19 (×2): 1.4 ug/kg/h via INTRAVENOUS
  Administered 2019-06-19: 11:00:00 1.2 ug/kg/h via INTRAVENOUS
  Administered 2019-06-20 (×2): 1.1 ug/kg/h via INTRAVENOUS
  Administered 2019-06-20: 02:00:00 1.4 ug/kg/h via INTRAVENOUS
  Administered 2019-06-20: 07:00:00 1.2 ug/kg/h via INTRAVENOUS
  Administered 2019-06-21 (×2): 1.4 ug/kg/h via INTRAVENOUS
  Filled 2019-06-14 (×10): qty 100
  Filled 2019-06-14: qty 200
  Filled 2019-06-14 (×24): qty 100

## 2019-06-14 MED ORDER — SENNOSIDES-DOCUSATE SODIUM 8.6-50 MG PO TABS
1.0000 | ORAL_TABLET | Freq: Two times a day (BID) | ORAL | Status: DC
  Administered 2019-06-14 (×2): 1 via ORAL
  Filled 2019-06-14 (×3): qty 1

## 2019-06-14 MED ORDER — SODIUM CHLORIDE 0.9 % IV SOLN
2.0000 g | INTRAVENOUS | Status: AC
  Administered 2019-06-14 – 2019-06-20 (×7): 2 g via INTRAVENOUS
  Filled 2019-06-14 (×7): qty 20

## 2019-06-14 MED ORDER — PANTOPRAZOLE SODIUM 40 MG IV SOLR: 40.0000 mg | Freq: Two times a day (BID) | INTRAVENOUS | Status: DC

## 2019-06-14 NOTE — Progress Notes (Signed)
eLink Physician-Brief Progress Note Patient Name: BOLESLAUS HOLLOWAY DOB: 15-Jun-1960 MRN: [id number]   Date of Service  06/14/2019  HPI/Events of Note  Hypotension, normal LV function. CVP reportedly > 23  eICU Interventions  Will trial low dose Phenylephrine .        Kerry Kass Bora Broner 06/14/2019, 3:21 AM

## 2019-06-14 NOTE — Progress Notes (Signed)
Inpatient Diabetes Program Recommendations  AACE/ADA: New Consensus Statement on Inpatient Glycemic Control   Target Ranges:  Prepandial:   less than 140 mg/dL      Peak postprandial:   less than 180 mg/dL (1-2 hours)      Critically ill patients:  140 - 180 mg/dL   Results for GEARL, BARATTA" (MRN [id number]) as of 06/14/2019 09:28  Ref. Range 06/13/2019 07:11 06/13/2019 08:55 06/13/2019 11:04 06/13/2019 17:24 06/13/2019 19:46 06/13/2019 23:37 06/14/2019 03:44 06/14/2019 07:31  Glucose-Capillary Latest Ref Range: 70 - 99 mg/dL 168 (H) 175 (H)    Lantus 15 units @ 9:32 113 (H) 223 (H)  Novolog 9 units  220 (H)  Novolog 9 units 173 (H)  Novolog 6 units 170 (H)  Novolog 6 units 136 (H)  Novolog 6 units  Results for BENZION, MESTA" (MRN [id number]) as of 06/14/2019 09:28  Ref. Range 06/12/2019 09:27 06/12/2019 11:04  Beta-Hydroxybutyric Acid Latest Ref Range: 0.05 - 0.27 mmol/L  0.26  Glucose Latest Ref Range: 70 - 99 mg/dL [facility])   Results for CARLYN, MULLENBACH" (MRN [id number]) as of 06/14/2019 09:28  Ref. Range 05/17/2017 11:13 06/12/2019 19:06  Hemoglobin A1C Latest Ref Range: 4.8 - 5.6 % 6.4 (H) 14.1 (H)   Review of Glycemic Control  Diabetes history: DM2 Outpatient Diabetes medications: Metformin 500 mg QAM Current orders for Inpatient glycemic control: Novolog 3-9 units Q4H  Inpatient Diabetes Program Recommendations:   Insulin-Basal: Noted patient received Lantus 15 units on 06/13/19 at 9:32 am when transitioned off IV insulin. Please consider ordering Lantus 15 units Q24H (based on 75.7 kg x 0.2 units).  HbgA1C: A1C 14.1% on 06/12/19 indicating an average glucose of 358 mg/dl over the past 2-3 months. Anticipate patient will require insulin as an outpatient for DM control.  NOTE: Patient was intubated and transferred from [facility] to [facility] on 06/13/19. Patient's initial glucose on 06/12/19 was 561 mg/dl and patient reported glucose readings over 400 mg/dl at home. Patient was  initially ordered IV insulin and was transitioned to SQ insulin yesterday morning.   Thanks, Barnie Alderman, RN, MSN, CDE Diabetes Coordinator Inpatient Diabetes Program [phone] (Team Pager from 8am to 5pm)

## 2019-06-14 NOTE — Consult Note (Signed)
PULMONARY / CRITICAL CARE MEDICINE   NAME:  CURTEZ BRALLIER, MRN:  [id number], DOB:  1960/08/23, LOS: 2 ADMISSION DATE:  06/12/2019, CONSULTATION DATE:  2/8 REFERRING MD:  Triad , CHIEF COMPLAINT:  sob  BRIEF HISTORY:    11 yobm with  DM/HBP/prostate ca admit 2/8 with dysuria/low grade fever x 2 weeks PTA and high blood sugars/ trace Betahydroxybutyrate and low Na, rx with Insulin/ fluids admit to ICU on insulin drip/meropenem with increasing sob and abd distention > CT Abd s acute findings and pccm asked to see am 2/9.   HISTORY OF PRESENT ILLNESS   59 y.o. male with medical history significant of diabetes, hypertension, prostate cancer status post prostate resection, presents to the emergency room with a 2-week history of elevated blood sugars.  He reported that his sugars have been over 400.  He has been having intermittent fevers for the past few days.  He describes dysuria for the past 2 weeks.  No abdominal pain or flank pain.  He has had shortness of breath am of admit , but no cough.  He has been having hiccups intermittently for the past few weeks.  He has not had any vomiting or diarrhea.  No blurred vision.  He has had a distended abdomen, but wife reports that this is been present for several years now.  ED Course: He was evaluated emergency room where lab work showed that he was hyperglycemic, hyponatremic, elevated creatinine.  Urinalysis indicated possible infection.  CT of the abdomen pelvis performed showed questionable stranding around kidneys but otherwise no acute abnormalities.  He was started on insulin infusion as well as IV fluids and referred for admission.   SIGNIFICANT PAST MEDICAL HISTORY   AODM Prostate ca s/p prostatectomy  SIGNIFICANT EVENTS:  2/9: transferred from [facility]  STUDIES:   CT abd  2/8: Cholelithiasis without complicating factors. Mild perinephric stranding is noted on the left without obstructive change. Underlying UTI could not be totally  excluded. Mild bibasilar atelectasis.  2/9 echo: LVEF 60-65%, LVH   CULTURES:  Covid 19 PCR   2/8 neg  Resp viral panel 2/8 neg  MRSA PCR  2/8  Neg  BC x 2  2/8 >>> UC 2/8 >>> gnr  ANTIBIOTICS:  Meropenem 2/8 >>>2/10 Ctx 2/10->  LINES/TUBES:    CONSULTANTS:  PCCM    Scheduled Meds: . chlorhexidine gluconate (MEDLINE KIT)  15 mL Mouth Rinse BID  . Chlorhexidine Gluconate Cloth  6 each Topical Daily  . heparin  5,000 Units Subcutaneous Q8H  . insulin aspart  3-9 Units Subcutaneous Q4H  . ipratropium-albuterol  3 mL Nebulization Q6H  . mouth rinse  15 mL Mouth Rinse 10 times per day  . pantoprazole (PROTONIX) IV  40 mg Intravenous Daily   Continuous Infusions: . sodium chloride 125 mL/hr at 06/14/19 0818  . fentaNYL 10 mcg/ml infusion 100 mcg/hr (06/14/19 0600)  . fentaNYL infusion INTRAVENOUS 200 mcg/hr (06/14/19 0934)  . meropenem (MERREM) IV 1 g (06/14/19 0610)  . phenylephrine (NEO-SYNEPHRINE) Adult infusion 22 mcg/min (06/14/19 0600)   PRN Meds:.acetaminophen **OR** acetaminophen, albuterol, dextrose, fentaNYL, midazolam   SUBJECTIVE:  2/10: had og placed upon arrival for decompression. Intubated prior to transfer.   CONSTITUTIONAL: BP 108/72   Pulse 81   Temp 98.1 F (36.7 C) (Oral)   Resp 17   Ht 5' 6"  (1.676 m)   Wt 75.7 kg   SpO2 93%   BMI 26.94 kg/m   I/O last 3 completed shifts: In: 5255.1 [  I.V.:4855.1; IV Piggyback:400] Out: 500 [Urine:500]    Intake/Output Summary (Last 24 hours) at 06/14/2019 1012 Last data filed at 06/14/2019 0600 Gross per 24 hour  Intake 3194.19 ml  Output 400 ml  Net 2794.19 ml    CVP:  [9 mmHg-23 mmHg] 23 mmHg  Vent Mode: PSV;CPAP FiO2 (%):  [40 %-50 %] 40 % Set Rate:  [18 bmp] 18 bmp Vt Set:  [500 mL-510 mL] 510 mL PEEP:  [5 cmH20] 5 cmH20 Pressure Support:  [12 cmH20] 12 cmH20 Plateau Pressure:  [24 cmH20-36 cmH20] 36 cmH20  PHYSICAL EXAM: General:  Obese wm somewhat grunting respirations Neuro:  Alert/  approp HEENT:  Mucosa dry Cardiovascular:  RRR no s Lungs: lungs with distant bs, no wheezing  Abdomen:  Mod distended, poor excursion Musculoskeletal:  No deformites/ warm feet Skin:  Burn scars both legs    RESOLVED PROBLEM LIST    ASSESSMENT AND PLAN   1)  Acute on chronic renal failure in setting of longstanding aodm (? No longer on metformin? ) and uti with possible sepsis/ vol depletion.  >>> rec NS/ check Urine na, osm, need central line for cvp monitoring and foley for uop  >>> if not increase uop over never 24 h will need renal to see   2)  Hyponatremia/ ? On hctz as outpt/  is not all due to high glucose  and suggests vol depletion despite positive I/0s since admit  >>> rec continue vol expand with NS  3)  UTI / sepsis >  Cont abx F/u cx  4) acute hypoxemic resp failure  -Intubated overnight  Normocytic anemia:  -stable  Hypotension:  Related to fentanyl use Titrate vasopressors to map >65 SUMMARY OF TODAY'S PLAN:   volume expand and w/u oliguric acute on chronic renal failure   Best Practice / Goals of Care / Disposition.   DVT PROPHYLAXIS:  Hep sq  SUP: Protonix  NUTRITION:npo MOBILITY: up as tol  GOALS OF CARE: full code FAMILY DISCUSSIONS: per TRiad  DISPOSITION hopefully home   LABS  Glucose Recent Labs  Lab 06/13/19 1104 06/13/19 1724 06/13/19 1946 06/13/19 2337 06/14/19 0344 06/14/19 0731  GLUCAP 113* 223* 220* 173* 170* 136*    BMET Recent Labs  Lab 06/13/19 1851 06/14/19 0230 06/14/19 0611  NA 124* 124* 126*  K 4.5 4.2 4.0  CL 91* 92* 96*  CO2 20* 19* 18*  BUN 54* 54* 58*  CREATININE 3.95* 3.96* 3.82*  GLUCOSE 234* 175* 143*    Liver Enzymes Recent Labs  Lab 06/12/19 0927  AST 54*  ALT 47*  ALKPHOS 285*  BILITOT 2.2*  ALBUMIN 3.5    Electrolytes Recent Labs  Lab 06/13/19 1851 06/14/19 0230 06/14/19 0611  CALCIUM 8.1* 7.9* 8.0*  MG  --   --  2.1  PHOS  --   --  5.3*    CBC Recent Labs  Lab  06/12/19 0927 06/12/19 1854 06/14/19 0611  WBC 18.8* 14.7* 12.2*  HGB 11.3* 10.5* 9.0*  HCT 34.8* 31.9* 28.0*  PLT 224 202 193    ABG Recent Labs  Lab 06/13/19 0751 06/13/19 1553  PHART 7.359 7.374  PCO2ART 40.8 36.6  PO2ART 70.5* 81.3*    Coag's Recent Labs  Lab 06/12/19 1854  APTT 45*  INR 1.5*    Sepsis Markers Recent Labs  Lab 06/12/19 1907 06/12/19 2210  LATICACIDVEN 3.6* 2.7*    Cardiac Enzymes No results for input(s): TROPONINI, PROBNP in the last 168 hours.  Critical care time: The patient is critically ill with multiple organ systems failure and requires high complexity decision making for assessment and support, frequent evaluation and titration of therapies, application of advanced monitoring technologies and extensive interpretation of multiple databases.  Critical care time 39 mins. This represents my time independent of the NPs time taking care of the pt. This is excluding procedures.    [facility] 06/14/2019, 10:12 AM

## 2019-06-15 ENCOUNTER — Inpatient Hospital Stay (HOSPITAL_COMMUNITY): Payer: Medicare Other

## 2019-06-15 LAB — BASIC METABOLIC PANEL
Anion gap: 15 (ref 5–15)
Anion gap: 15 (ref 5–15)
BUN: 56 mg/dL — ABNORMAL HIGH (ref 8–23)
BUN: 50 mg/dL — ABNORMAL HIGH (ref 8–23)
CO2: 17 mmol/L — ABNORMAL LOW (ref 22–32)
CO2: 21 mmol/L — ABNORMAL LOW (ref 22–32)
Calcium: 7.8 mg/dL — ABNORMAL LOW (ref 8.9–10.3)
Calcium: 8.5 mg/dL — ABNORMAL LOW (ref 8.9–10.3)
Chloride: 99 mmol/L (ref 98–111)
Chloride: 100 mmol/L (ref 98–111)
Creatinine, Ser: 3.17 mg/dL — ABNORMAL HIGH (ref 0.61–1.24)
Creatinine, Ser: 3.06 mg/dL — ABNORMAL HIGH (ref 0.61–1.24)
GFR calc Af Amer: 23 mL/min — ABNORMAL LOW (ref >60)
GFR calc Af Amer: 24 mL/min — ABNORMAL LOW (ref >60)
GFR calc non Af Amer: 20 mL/min — ABNORMAL LOW (ref >60)
GFR calc non Af Amer: 21 mL/min — ABNORMAL LOW (ref >60)
Glucose, Bld: 139 mg/dL — ABNORMAL HIGH (ref 70–99)
Glucose, Bld: 127 mg/dL — ABNORMAL HIGH (ref 70–99)
Potassium: 3.4 mmol/L — ABNORMAL LOW (ref 3.5–5.1)
Potassium: 3.2 mmol/L — ABNORMAL LOW (ref 3.5–5.1)
Sodium: 131 mmol/L — ABNORMAL LOW (ref 135–145)
Sodium: 136 mmol/L (ref 135–145)

## 2019-06-15 LAB — POCT I-STAT 7, (LYTES, BLD GAS, ICA,H+H)
Acid-base deficit: 9 mmol/L — ABNORMAL HIGH (ref 0.0–2.0)
Bicarbonate: 16.5 mmol/L — ABNORMAL LOW (ref 20.0–28.0)
Calcium, Ion: 1.07 mmol/L — ABNORMAL LOW (ref 1.15–1.40)
HCT: 31 % — ABNORMAL LOW (ref 39.0–52.0)
Hemoglobin: 10.5 g/dL — ABNORMAL LOW (ref 13.0–17.0)
O2 Saturation: 89 %
Patient temperature: 97.49
Potassium: 4 mmol/L (ref 3.5–5.1)
Sodium: 131 mmol/L — ABNORMAL LOW (ref 135–145)
TCO2: 17 mmol/L — ABNORMAL LOW (ref 22–32)
pCO2 arterial: 31.2 mmHg — ABNORMAL LOW (ref 32.0–48.0)
pH, Arterial: 7.328 — ABNORMAL LOW (ref 7.350–7.450)
pO2, Arterial: 58 mmHg — ABNORMAL LOW (ref 83.0–108.0)

## 2019-06-15 LAB — GLUCOSE, CAPILLARY
Glucose-Capillary: 117 mg/dL — ABNORMAL HIGH (ref 70–99)
Glucose-Capillary: 126 mg/dL — ABNORMAL HIGH (ref 70–99)
Glucose-Capillary: 116 mg/dL — ABNORMAL HIGH (ref 70–99)
Glucose-Capillary: 115 mg/dL — ABNORMAL HIGH (ref 70–99)
Glucose-Capillary: 133 mg/dL — ABNORMAL HIGH (ref 70–99)
Glucose-Capillary: 132 mg/dL — ABNORMAL HIGH (ref 70–99)

## 2019-06-15 LAB — URINE CULTURE: Culture: >=100000 — AB

## 2019-06-15 LAB — BRAIN NATRIURETIC PEPTIDE: B Natriuretic Peptide: 1930.8 pg/mL — ABNORMAL HIGH (ref 0.0–100.0)

## 2019-06-15 LAB — LACTIC ACID, PLASMA: Lactic Acid, Venous: 1.2 mmol/L (ref 0.5–1.9)

## 2019-06-15 MED ORDER — FUROSEMIDE 10 MG/ML IJ SOLN
20.0000 mg | Freq: Three times a day (TID) | INTRAMUSCULAR | Status: AC
  Administered 2019-06-15 – 2019-06-16 (×4): 20 mg via INTRAVENOUS
  Filled 2019-06-15 (×4): qty 2

## 2019-06-15 MED ORDER — POTASSIUM CHLORIDE 20 MEQ/15ML (10%) PO SOLN
40.0000 meq | Freq: Once | ORAL | Status: AC
  Administered 2019-06-15: 40 meq
  Filled 2019-06-15: qty 30

## 2019-06-15 MED ORDER — CHLORHEXIDINE GLUCONATE CLOTH 2 % EX PADS
6.0000 | MEDICATED_PAD | Freq: Every day | CUTANEOUS | Status: DC
  Administered 2019-06-16 – 2019-06-28 (×14): 6 via TOPICAL

## 2019-06-15 MED ORDER — INSULIN ASPART 100 UNIT/ML ~~LOC~~ SOLN
3.0000 [IU] | SUBCUTANEOUS | Status: DC
  Administered 2019-06-15 – 2019-06-16 (×3): 3 [IU] via SUBCUTANEOUS
  Administered 2019-06-16: 9 [IU] via SUBCUTANEOUS
  Administered 2019-06-16 (×2): 3 [IU] via SUBCUTANEOUS
  Administered 2019-06-17 (×2): 6 [IU] via SUBCUTANEOUS

## 2019-06-15 MED ORDER — CALCIUM GLUCONATE 10 % IV SOLN: 1.0000 g | Freq: Once | INTRAVENOUS | Status: DC

## 2019-06-15 MED ORDER — SENNOSIDES-DOCUSATE SODIUM 8.6-50 MG PO TABS: 1.0000 | ORAL_TABLET | Freq: Two times a day (BID) | ORAL | Status: DC

## 2019-06-15 MED ORDER — SODIUM CHLORIDE 0.9 % IV SOLN: INTRAVENOUS | Status: DC

## 2019-06-15 MED ORDER — SODIUM BICARBONATE 8.4 % IV SOLN
100.0000 meq | Freq: Once | INTRAVENOUS | Status: AC
  Administered 2019-06-15: 100 meq via INTRAVENOUS
  Filled 2019-06-15: qty 50

## 2019-06-15 MED ORDER — VITAL AF 1.2 CAL PO LIQD
1000.0000 mL | ORAL | Status: DC
  Administered 2019-06-15 – 2019-06-20 (×7): 1000 mL
  Filled 2019-06-15: qty 1000

## 2019-06-15 MED ORDER — PRO-STAT SUGAR FREE PO LIQD: 30.0000 mL | Freq: Two times a day (BID) | ORAL | Status: DC

## 2019-06-15 MED ORDER — POLYETHYLENE GLYCOL 3350 17 G PO PACK: 17.0000 g | PACK | Freq: Every day | ORAL | Status: DC

## 2019-06-15 MED ORDER — CALCIUM GLUCONATE-NACL 1-0.675 GM/50ML-% IV SOLN
1.0000 g | Freq: Once | INTRAVENOUS | Status: AC
  Administered 2019-06-15: 1000 mg via INTRAVENOUS
  Filled 2019-06-15: qty 50

## 2019-06-15 MED ORDER — SENNOSIDES-DOCUSATE SODIUM 8.6-50 MG PO TABS
1.0000 | ORAL_TABLET | Freq: Two times a day (BID) | ORAL | Status: DC
  Administered 2019-06-15 – 2019-06-20 (×8): 1
  Filled 2019-06-15 (×8): qty 1

## 2019-06-15 MED ORDER — CALCIUM GLUCONATE-NACL 2-0.675 GM/100ML-% IV SOLN
2.0000 g | Freq: Once | INTRAVENOUS | Status: AC
  Administered 2019-06-15: 12:00:00 2000 mg via INTRAVENOUS
  Filled 2019-06-15: qty 100

## 2019-06-15 MED ORDER — FUROSEMIDE 10 MG/ML IJ SOLN
20.0000 mg | Freq: Once | INTRAMUSCULAR | Status: AC
  Administered 2019-06-15: 20 mg via INTRAVENOUS
  Filled 2019-06-15: qty 2

## 2019-06-15 MED ORDER — POLYETHYLENE GLYCOL 3350 17 G PO PACK
17.0000 g | PACK | Freq: Every day | ORAL | Status: DC
  Administered 2019-06-15 – 2019-06-20 (×4): 17 g
  Filled 2019-06-15 (×4): qty 1

## 2019-06-15 MED ORDER — INSULIN ASPART 100 UNIT/ML ~~LOC~~ SOLN: 0.0000 [IU] | Freq: Three times a day (TID) | SUBCUTANEOUS | Status: DC

## 2019-06-15 MED ORDER — POTASSIUM CHLORIDE 20 MEQ/15ML (10%) PO SOLN
40.0000 meq | Freq: Two times a day (BID) | ORAL | Status: AC
  Administered 2019-06-15 – 2019-06-16 (×2): 40 meq
  Filled 2019-06-15 (×2): qty 30

## 2019-06-15 NOTE — Progress Notes (Signed)
ABG obtained on pt with the following results without any changes at this time. RT will continue to monitor.   Results for CADIN, LUKA" (MRN [id number]) as of 06/15/2019 04:16  Ref. Range 06/15/2019 03:17  Sample type Unknown ARTERIAL  pH, Arterial Latest Ref Range: 7.350 - 7.450  7.328 (L)  pCO2 arterial Latest Ref Range: 32.0 - 48.0 mmHg 31.2 (L)  pO2, Arterial Latest Ref Range: 83.0 - 108.0 mmHg 58.0 (L)  TCO2 Latest Ref Range: 22 - 32 mmol/L 17 (L)  Acid-base deficit Latest Ref Range: 0.0 - 2.0 mmol/L 9.0 (H)  Bicarbonate Latest Ref Range: 20.0 - 28.0 mmol/L 16.5 (L)  O2 Saturation Latest Units: % 89.0  Patient temperature Unknown 97.49 F  Collection site Unknown BRACHIAL ARTERY

## 2019-06-15 NOTE — Progress Notes (Signed)
PULMONARY / CRITICAL CARE MEDICINE   NAME:  Scott Mcgee, MRN:  [id number], DOB:  [birth year], LOS: 3 ADMISSION DATE:  06/12/2019, CONSULTATION DATE:  2/8 REFERRING MD:  Triad , CHIEF COMPLAINT:  sob  BRIEF HISTORY:    38 yobm with  DM/HBP/prostate ca admit 2/8 with dysuria/low grade fever x 2 weeks PTA and high blood sugars/ trace Betahydroxybutyrate and low Na, rx with Insulin/ fluids admit to ICU on insulin drip/meropenem with increasing sob and abd distention > CT Abd s acute findings and pccm asked to see am 2/9.   HISTORY OF PRESENT ILLNESS   59 y.o. male with medical history significant of diabetes, hypertension, prostate cancer status post prostate resection, presents to the emergency room with a 2-week history of elevated blood sugars.  He reported that his sugars have been over 400.  He has been having intermittent fevers for the past few days.  He describes dysuria for the past 2 weeks.  No abdominal pain or flank pain.  He has had shortness of breath am of admit , but no cough.  He has been having hiccups intermittently for the past few weeks.  He has not had any vomiting or diarrhea.  No blurred vision.  He has had a distended abdomen, but wife reports that this is been present for several years now.  ED Course: He was evaluated emergency room where lab work showed that he was hyperglycemic, hyponatremic, elevated creatinine.  Urinalysis indicated possible infection.  CT of the abdomen pelvis performed showed questionable stranding around kidneys but otherwise no acute abnormalities.  He was started on insulin infusion as well as IV fluids and referred for admission.   SIGNIFICANT PAST MEDICAL HISTORY   AODM Prostate ca s/p prostatectomy  SIGNIFICANT EVENTS:  2/9: transferred from [facility]  STUDIES:   CT abd  2/8: Cholelithiasis without complicating factors. Mild perinephric stranding is noted on the left without obstructive change. Underlying UTI could not be totally  excluded. Mild bibasilar atelectasis.  2/9 echo: LVEF 60-65%, LVH   CULTURES:  Covid 19 PCR   2/8 neg  Resp viral panel 2/8 neg  MRSA PCR  2/8  Neg  BC x 2  2/8 >>>ngtd UC 2/8 >>> ecoli  ANTIBIOTICS:  Meropenem 2/8 >>>2/10 Ctx 2/10->  LINES/TUBES:   2/9 ett CONSULTANTS:  PCCM    Scheduled Meds: . chlorhexidine gluconate (MEDLINE KIT)  15 mL Mouth Rinse BID  . Chlorhexidine Gluconate Cloth  6 each Topical Daily  . insulin aspart  3-9 Units Subcutaneous Q4H  . ipratropium-albuterol  3 mL Nebulization Q6H  . mouth rinse  15 mL Mouth Rinse 10 times per day  . pantoprazole (PROTONIX) IV  40 mg Intravenous Q12H  . polyethylene glycol  17 g Oral Daily  . senna-docusate  1 tablet Oral BID   Continuous Infusions: . sodium chloride 125 mL/hr at 06/15/19 0600  . cefTRIAXone (ROCEPHIN)  IV Stopped (06/14/19 1801)  . dexmedetomidine (PRECEDEX) IV infusion 1.8 mcg/kg/hr (06/15/19 0743)  . fentaNYL infusion INTRAVENOUS 200 mcg/hr (06/15/19 0645)  . phenylephrine (NEO-SYNEPHRINE) Adult infusion 30 mcg/min (06/15/19 0600)   PRN Meds:.acetaminophen **OR** acetaminophen, albuterol, dextrose, fentaNYL, midazolam   SUBJECTIVE:  2/11: decompensated req increased vent settings overnight. Attempts at diuresis made despite pressor requirement. Still no bm will add reglan.  2/10: had og placed upon arrival for decompression. Intubated prior to transfer.   CONSTITUTIONAL: BP 109/73   Pulse 79   Temp 98.1 F (36.7 C) (Oral)   Resp Marland Kitchen)  22   Ht 5' 6" (1.676 m)   Wt 75.7 kg   SpO2 90%   BMI 26.94 kg/m   I/O last 3 completed shifts: In: 6055.9 [I.V.:5795.9; NG/GT:60; IV Piggyback:200] Out: 2800 [Urine:2800]    Intake/Output Summary (Last 24 hours) at 06/15/2019 0809 Last data filed at 06/15/2019 0600 Gross per 24 hour  Intake 4504.29 ml  Output 2550 ml  Net 1954.29 ml       Vent Mode: PRVC FiO2 (%):  [40 %-80 %] 80 % Set Rate:  [18 bmp] 18 bmp Vt Set:  [510 mL] 510 mL PEEP:   [5 cmH20-10 cmH20] 10 cmH20 Pressure Support:  [8 cmH20-12 cmH20] 8 cmH20 Plateau Pressure:  [29 cmH20-36 cmH20] 36 cmH20  PHYSICAL EXAM: General:  Ill appearing male on vent sedated.  Neuro: sedated on vent HEENT:  NCAT, perrla Mucosa dry Cardiovascular:  RRR no s Lungs: lungs with distant bs, no wheezing  Abdomen:  Taught, protuberant, distended, no rebound or guarding, poor excursion Musculoskeletal:  No deformites/ warm feet Skin:  Burn scars both legs    RESOLVED PROBLEM LIST    ASSESSMENT AND PLAN   1)  Acute on chronic renal failure in setting of longstanding dm (? No longer on metformin? ) and uti with possible sepsis/ vol depletion.  -uop good overnight.  -follow indices -if improved will repeat diuresis despite pressor Metabolic acidosis:  -lactate normal.  -give 2 amps bicarb.   2)  Hyponatremia:  -improved somewhat to 131. Which is appropriate rise over 24 hours.  -holding home hctz  3)  UTI / sepsis >  Cont abx F/u cx  4) acute hypoxemic resp failure  -worsening vent requirement -monitor cxr with increased infiltrate, personally reviewed.  -? Aspiration with more dense infiltrate on R -on ceftriaxone.  -send sputum and if clinically appearing more septic could broaden  HFpEF:  -echo with preserved ef but LVH -bnp ~2K -attempting some diuresis despite renal function at this time with good output.  -awaiting am labs.   Normocytic anemia:  -stable  Hypotension:  Related to fentanyl use Titrate vasopressors to map >65  Abdominal distention:  -miralax, senna/docusate -reglan   Best Practice / Goals of Care / Disposition.   DVT PROPHYLAXIS:  Hep sq  SUP: Protonix  NUTRITION:tf to start MOBILITY: up as tol  GOALS OF CARE: full code FAMILY DISCUSSIONS: wife updated at bedside 2/10 DISPOSITION: ICU  LABS  Glucose Recent Labs  Lab 06/14/19 1132 06/14/19 1251 06/14/19 1513 06/14/19 1930 06/14/19 2321 06/15/19 0316  GLUCAP 73 77 84 112*  139* 117*    BMET Recent Labs  Lab 06/13/19 1851 06/13/19 1851 06/14/19 0230 06/14/19 0230 06/14/19 0611 06/15/19 0301 06/15/19 0317  NA 124*   < > 124*   < > 126* 131* 131*  K 4.5   < > 4.2   < > 4.0 4.2 4.0  CL 91*  --  92*  --  96*  --   --   CO2 20*  --  19*  --  18*  --   --   BUN 54*  --  54*  --  58*  --   --   CREATININE 3.95*  --  3.96*  --  3.82*  --   --   GLUCOSE 234*  --  175*  --  143*  --   --    < > = values in this interval not displayed.    Liver Enzymes Recent Labs  Lab  06/12/19 0927  AST 54*  ALT 47*  ALKPHOS 285*  BILITOT 2.2*  ALBUMIN 3.5    Electrolytes Recent Labs  Lab 06/13/19 1851 06/14/19 0230 06/14/19 0611  CALCIUM 8.1* 7.9* 8.0*  MG  --   --  2.1  PHOS  --   --  5.3*    CBC Recent Labs  Lab 06/12/19 0927 06/12/19 0927 06/12/19 1854 06/12/19 1854 06/14/19 0611 06/15/19 0301 06/15/19 0317  WBC 18.8*  --  14.7*  --  12.2*  --   --   HGB 11.3*   < > 10.5*   < > 9.0* 10.2* 10.5*  HCT 34.8*   < > 31.9*   < > 28.0* 30.0* 31.0*  PLT 224  --  202  --  193  --   --    < > = values in this interval not displayed.    ABG Recent Labs  Lab 06/13/19 1553 06/15/19 0301 06/15/19 0317  PHART 7.374 7.248* 7.328*  PCO2ART 36.6 43.0 31.2*  PO2ART 81.3* 25.0* 58.0*    Coag's Recent Labs  Lab 06/12/19 1854  APTT 45*  INR 1.5*    Sepsis Markers Recent Labs  Lab 06/12/19 1907 06/12/19 2210  LATICACIDVEN 3.6* 2.7*    Cardiac Enzymes No results for input(s): TROPONINI, PROBNP in the last 168 hours.    Critical care time: The patient is critically ill with multiple organ systems failure and requires high complexity decision making for assessment and support, frequent evaluation and titration of therapies, application of advanced monitoring technologies and extensive interpretation of multiple databases.  Critical care time 40 mins. This represents my time independent of the NPs time taking care of the pt. This is  excluding procedures.    [facility] 06/15/2019, 8:09 AM

## 2019-06-15 NOTE — Progress Notes (Signed)
Initial Nutrition Assessment  DOCUMENTATION CODES:   Not applicable  INTERVENTION:   Tube Feeding:   Vital AF 1.2 at 30 ml/h, increase by 10 ml every 4 hours to goal rate of 70 ml/h (1680 ml per day)   Provides 2016 kcal, 126 gm protein, 1362 ml free water daily  NUTRITION DIAGNOSIS:   Inadequate oral intake related to inability to eat as evidenced by NPO status.  GOAL:   Patient will meet greater than or equal to 90% of their needs  MONITOR:   Vent status, TF tolerance, Labs, Skin, I & O's  REASON FOR ASSESSMENT:   Ventilator, Consult Enteral/tube feeding initiation and management  ASSESSMENT:   59 yo male admitted with dysuria, low grade fever, elevated blood sugars. Developed increasing SOB and abdominal distention and was intubated 2/9. PMH includes DM, HTN, prostate cancer.   RD working remotely. Received MD Consult for TF initiation and management. OG tube in place.   Patient with ongoing abdominal distention. Receiving Miralax and Senokot. Reglan added today.   Patient is currently intubated on ventilator support, on precedex and fentanyl. Also requiring levophed. MV: 17 L/min Temp (24hrs), Avg:98 F (36.7 C), Min:97.6 F (36.4 C), Max:98.6 F (37 C)   Labs reviewed. Na 131 (L), K 3.4 (L), BUN 56 (H), creat 3.17 (H) CBG's: [phone]  Medications reviewed and include novolog, miralax, senokot-s, levophed.  Unsure of usual weight. Current weight could be a little higher than usual due to volume overload. Attempting diuresis.  Moderate edema to BUE.  NUTRITION - FOCUSED PHYSICAL EXAM:  unable to complete  Diet Order:   Diet Order            Diet NPO time specified  Diet effective now              EDUCATION NEEDS:   Not appropriate for education at this time  Skin:  Skin Assessment: Reviewed RN Assessment  Last BM:  2/10 type 2  Height:   Ht Readings from Last 1 Encounters:  06/13/19 5\' 6"  (1.676 m)    Weight:   Wt Readings  from Last 1 Encounters:  06/12/19 75.7 kg    Ideal Body Weight:  64.5 kg  BMI:  Body mass index is 26.94 kg/m.  Estimated Nutritional Needs:   Kcal:  1950  Protein:  110-130 gm  Fluid:  >/= 2 L    Molli Barrows, RD, LDN, CNSC Please refer to Amion for contact information.

## 2019-06-15 NOTE — Progress Notes (Signed)
eLink Physician-Brief Progress Note Patient Name: Scott Mcgee DOB: 03/03/1961 MRN: [id number]   Date of Service  06/15/2019  HPI/Events of Note  Pt with hypoxemia  eICU Interventions  PEEP increased initially to 8 cm, then to 10 cm. Portable CXR ordered, check BNP, Lasix 20 mg iv x 1        Scott Mcgee U Scott Mcgee 06/15/2019, 3:26 AM

## 2019-06-16 ENCOUNTER — Inpatient Hospital Stay (HOSPITAL_COMMUNITY): Payer: Medicare Other

## 2019-06-16 LAB — BASIC METABOLIC PANEL
Anion gap: 15 (ref 5–15)
BUN: 54 mg/dL — ABNORMAL HIGH (ref 8–23)
CO2: 21 mmol/L — ABNORMAL LOW (ref 22–32)
Calcium: 8.9 mg/dL (ref 8.9–10.3)
Chloride: 101 mmol/L (ref 98–111)
Creatinine, Ser: 2.64 mg/dL — ABNORMAL HIGH (ref 0.61–1.24)
GFR calc Af Amer: 29 mL/min — ABNORMAL LOW (ref >60)
GFR calc non Af Amer: 25 mL/min — ABNORMAL LOW (ref >60)
Glucose, Bld: 268 mg/dL — ABNORMAL HIGH (ref 70–99)
Potassium: 4.8 mmol/L (ref 3.5–5.1)
Sodium: 137 mmol/L (ref 135–145)

## 2019-06-16 LAB — COMPREHENSIVE METABOLIC PANEL
ALT: 34 U/L (ref 0–44)
AST: 60 U/L — ABNORMAL HIGH (ref 15–41)
Albumin: 1.9 g/dL — ABNORMAL LOW (ref 3.5–5.0)
Alkaline Phosphatase: 539 U/L — ABNORMAL HIGH (ref 38–126)
Anion gap: 13 (ref 5–15)
BUN: 51 mg/dL — ABNORMAL HIGH (ref 8–23)
CO2: 24 mmol/L (ref 22–32)
Calcium: 8.8 mg/dL — ABNORMAL LOW (ref 8.9–10.3)
Chloride: 102 mmol/L (ref 98–111)
Creatinine, Ser: 2.61 mg/dL — ABNORMAL HIGH (ref 0.61–1.24)
GFR calc Af Amer: 29 mL/min — ABNORMAL LOW (ref >60)
GFR calc non Af Amer: 25 mL/min — ABNORMAL LOW (ref >60)
Glucose, Bld: 143 mg/dL — ABNORMAL HIGH (ref 70–99)
Potassium: 3.6 mmol/L (ref 3.5–5.1)
Sodium: 139 mmol/L (ref 135–145)
Total Bilirubin: 2.2 mg/dL — ABNORMAL HIGH (ref 0.3–1.2)
Total Protein: 6.4 g/dL — ABNORMAL LOW (ref 6.5–8.1)

## 2019-06-16 LAB — GLUCOSE, CAPILLARY
Glucose-Capillary: 116 mg/dL — ABNORMAL HIGH (ref 70–99)
Glucose-Capillary: 121 mg/dL — ABNORMAL HIGH (ref 70–99)
Glucose-Capillary: 127 mg/dL — ABNORMAL HIGH (ref 70–99)
Glucose-Capillary: 181 mg/dL — ABNORMAL HIGH (ref 70–99)
Glucose-Capillary: 191 mg/dL — ABNORMAL HIGH (ref 70–99)
Glucose-Capillary: 221 mg/dL — ABNORMAL HIGH (ref 70–99)
Glucose-Capillary: 69 mg/dL — ABNORMAL LOW (ref 70–99)

## 2019-06-16 LAB — CBC
HCT: 31.5 % — ABNORMAL LOW (ref 39.0–52.0)
Hemoglobin: 10.8 g/dL — ABNORMAL LOW (ref 13.0–17.0)
MCH: 29.5 pg (ref 26.0–34.0)
MCHC: 34.3 g/dL (ref 30.0–36.0)
MCV: 86.1 fL (ref 80.0–100.0)
Platelets: 269 10*3/uL (ref 150–400)
RBC: 3.66 MIL/uL — ABNORMAL LOW (ref 4.22–5.81)
RDW: 13.6 % (ref 11.5–15.5)
WBC: 10.5 10*3/uL (ref 4.0–10.5)
nRBC: 0 % (ref 0.0–0.2)

## 2019-06-16 LAB — POCT I-STAT 7, (LYTES, BLD GAS, ICA,H+H)
Acid-base deficit: 8 mmol/L — ABNORMAL HIGH (ref 0.0–2.0)
Bicarbonate: 18.9 mmol/L — ABNORMAL LOW (ref 20.0–28.0)
Calcium, Ion: 1.06 mmol/L — ABNORMAL LOW (ref 1.15–1.40)
HCT: 30 % — ABNORMAL LOW (ref 39.0–52.0)
Hemoglobin: 10.2 g/dL — ABNORMAL LOW (ref 13.0–17.0)
O2 Saturation: 39 %
Patient temperature: 97.6
Potassium: 4.2 mmol/L (ref 3.5–5.1)
Sodium: 131 mmol/L — ABNORMAL LOW (ref 135–145)
TCO2: 20 mmol/L — ABNORMAL LOW (ref 22–32)
pCO2 arterial: 43 mmHg (ref 32.0–48.0)
pH, Arterial: 7.248 — ABNORMAL LOW (ref 7.350–7.450)
pO2, Arterial: 25 mmHg — CL (ref 83.0–108.0)

## 2019-06-16 LAB — PHOSPHORUS: Phosphorus: 4.3 mg/dL (ref 2.5–4.6)

## 2019-06-16 LAB — MAGNESIUM
Magnesium: 1.8 mg/dL (ref 1.7–2.4)
Magnesium: 1.8 mg/dL (ref 1.7–2.4)

## 2019-06-16 MED ORDER — HEPARIN SODIUM (PORCINE) 5000 UNIT/ML IJ SOLN
5000.0000 [IU] | Freq: Three times a day (TID) | INTRAMUSCULAR | Status: DC
  Administered 2019-06-16 – 2019-06-29 (×41): 5000 [IU] via SUBCUTANEOUS
  Filled 2019-06-16 (×39): qty 1

## 2019-06-16 MED ORDER — MILK AND MOLASSES ENEMA
1.0000 | Freq: Once | RECTAL | Status: AC
  Administered 2019-06-16: 240 mL via RECTAL
  Filled 2019-06-16: qty 240

## 2019-06-16 MED ORDER — ALBUMIN HUMAN 5 % IV SOLN
12.5000 g | Freq: Once | INTRAVENOUS | Status: AC
  Administered 2019-06-16: 15:00:00 12.5 g via INTRAVENOUS
  Filled 2019-06-16: qty 250

## 2019-06-16 MED ORDER — PANTOPRAZOLE SODIUM 40 MG PO PACK
40.0000 mg | PACK | Freq: Every day | ORAL | Status: DC
  Administered 2019-06-16 – 2019-06-20 (×5): 40 mg
  Filled 2019-06-16 (×6): qty 20

## 2019-06-16 MED ORDER — DEXTROSE 50 % IV SOLN
12.5000 g | INTRAVENOUS | Status: AC
  Administered 2019-06-16: 03:00:00 12.5 g via INTRAVENOUS
  Filled 2019-06-16: qty 50

## 2019-06-16 NOTE — Progress Notes (Signed)
Hypoglycemic Event  CBG: 69  Treatment: D50 25 mL (12.5 gm)  Symptoms: None  Follow-up CBG: Time:0358 CBG Result:116  Possible Reasons for Event: Unknown   Will continue to monitor.    Jackalyn Lombard

## 2019-06-16 NOTE — Progress Notes (Signed)
PULMONARY / CRITICAL CARE MEDICINE   NAME:  Scott Mcgee, MRN:  [id number], DOB:  07/25/1960, LOS: 4 ADMISSION DATE:  06/12/2019, CONSULTATION DATE:  2/8 REFERRING MD:  Triad , CHIEF COMPLAINT:  sob  BRIEF HISTORY:    21 yobm with  DM/HBP/prostate ca admit 2/8 with dysuria/low grade fever x 2 weeks PTA and high blood sugars/ trace Betahydroxybutyrate and low Na, rx with Insulin/ fluids admit to ICU on insulin drip/meropenem with increasing sob and abd distention > CT Abd s acute findings and pccm asked to see am 2/9.   HISTORY OF PRESENT ILLNESS   59 y.o. male with medical history significant of diabetes, hypertension, prostate cancer status post prostate resection, presents to the emergency room with a 2-week history of elevated blood sugars.  He reported that his sugars have been over 400.  He has been having intermittent fevers for the past few days.  He describes dysuria for the past 2 weeks.  No abdominal pain or flank pain.  He has had shortness of breath am of admit , but no cough.  He has been having hiccups intermittently for the past few weeks.  He has not had any vomiting or diarrhea.  No blurred vision.  He has had a distended abdomen, but wife reports that this is been present for several years now.  ED Course: He was evaluated emergency room where lab work showed that he was hyperglycemic, hyponatremic, elevated creatinine.  Urinalysis indicated possible infection.  CT of the abdomen pelvis performed showed questionable stranding around kidneys but otherwise no acute abnormalities.  He was started on insulin infusion as well as IV fluids and referred for admission.   SIGNIFICANT PAST MEDICAL HISTORY   AODM Prostate ca s/p prostatectomy  SIGNIFICANT EVENTS:  2/9: transferred from [facility]  STUDIES:   CT abd  2/8: Cholelithiasis without complicating factors. Mild perinephric stranding is noted on the left without obstructive change. Underlying UTI could not be totally  excluded. Mild bibasilar atelectasis.  2/9 echo: LVEF 60-65%, LVH   CULTURES:  Covid 19 PCR   2/8 neg  Resp viral panel 2/8 neg  MRSA PCR  2/8  Neg  BC x 2  2/8 >>>ngtd UC 2/8 >>> ecoli resp cx 2/11: ngtd  ANTIBIOTICS:  Meropenem 2/8 >>>2/10 Ctx 2/10->  LINES/TUBES:   2/9 ett CONSULTANTS:  PCCM    Scheduled Meds: . chlorhexidine gluconate (MEDLINE KIT)  15 mL Mouth Rinse BID  . Chlorhexidine Gluconate Cloth  6 each Topical Daily  . furosemide  20 mg Intravenous Q8H  . insulin aspart  3-9 Units Subcutaneous Q4H  . ipratropium-albuterol  3 mL Nebulization Q6H  . mouth rinse  15 mL Mouth Rinse 10 times per day  . pantoprazole (PROTONIX) IV  40 mg Intravenous Q12H  . polyethylene glycol  17 g Per Tube Daily  . potassium chloride  40 mEq Per Tube BID  . senna-docusate  1 tablet Per Tube BID   Continuous Infusions: . sodium chloride 10 mL/hr at 06/16/19 0746  . cefTRIAXone (ROCEPHIN)  IV Stopped (06/15/19 1804)  . dexmedetomidine (PRECEDEX) IV infusion 1.6 mcg/kg/hr (06/16/19 0746)  . feeding supplement (VITAL AF 1.2 CAL) 1,000 mL (06/15/19 1336)  . fentaNYL infusion INTRAVENOUS 50 mcg/hr (06/16/19 0746)  . phenylephrine (NEO-SYNEPHRINE) Adult infusion Stopped (06/16/19 0741)   PRN Meds:.acetaminophen **OR** acetaminophen, albuterol, dextrose, fentaNYL, midazolam   SUBJECTIVE:  2/12: diuresed >7L yesterday. Will cont today with improvement in renal function, although still not improved vent settings.  2/11: decompensated req increased vent settings overnight. Attempts at diuresis made despite pressor requirement. Still no bm will add reglan.  2/10: had og placed upon arrival for decompression. Intubated prior to transfer.   CONSTITUTIONAL: BP 105/68 (BP Location: Left Arm)   Pulse 85   Temp 99.6 F (37.6 C) (Axillary)   Resp 20   Ht 5' 6"  (1.676 m)   Wt 81.5 kg   SpO2 98%   BMI 29.00 kg/m   I/O last 3 completed shifts: In: 4873.7 [I.V.:4122.1; NG/GT:501.5; IV  Piggyback:250.1] Out: 9400 [Urine:9400]    Intake/Output Summary (Last 24 hours) at 06/16/2019 0852 Last data filed at 06/16/2019 0800 Gross per 24 hour  Intake 2404.39 ml  Output 7150 ml  Net -4745.61 ml       Vent Mode: PRVC FiO2 (%):  [70 %-80 %] 70 % Set Rate:  [18 bmp] 18 bmp Vt Set:  [510 mL] 510 mL PEEP:  [12 cmH20] 12 cmH20 Plateau Pressure:  [23 cmH20-30 cmH20] 23 cmH20  PHYSICAL EXAM: General:  Ill appearing male on vent sedated.  Neuro: sedated on vent HEENT:  NCAT, perrla Mucosa moist and pink Cardiovascular:  RRR no s Lungs: lungs with distant bs, no wheezing  Abdomen:  protuberant, distended, no rebound or guarding, poor excursion, bs hypoactive Musculoskeletal:  No deformites/ warm feet Skin:  Burn scars both legs    RESOLVED PROBLEM LIST    ASSESSMENT AND PLAN   1)  Acute on chronic renal failure in setting of longstanding dm (? No longer on metformin? ) and uti with possible sepsis/ vol depletion.  -improved with diuresis -uop excellent yesterday -follow indices Metabolic acidosis:  -resolved  2)  Hyponatremia:  -resolved -holding home hctz  3)  ecoli uti/ sepsis >   Cont abx 7 days   4) acute hypoxemic resp failure  -still req escalated vent settings.  -cxr in am. -? Aspiration with more dense infiltrate on R -on ceftriaxone.  -send sputum and if clinically appearing more septic could broaden, ngtd  HFpEF:  -echo with preserved ef but LVH -bnp ~2K -cont diuresis with improvement in hemodynamics, and renal function.    Normocytic anemia:  -stable  Hypotension:  -off phenylephrine  Abdominal distention:  -miralax, senna/docusate -reglan -enema today.   Best Practice / Goals of Care / Disposition.   DVT PROPHYLAXIS:  Hep sq  SUP: Protonix  NUTRITION:tf MOBILITY: bedrest GOALS OF CARE: full code FAMILY DISCUSSIONS: wife updated at bedside 2/12 DISPOSITION: ICU  LABS  Glucose Recent Labs  Lab 06/15/19 1534  06/15/19 1925 06/15/19 2325 06/16/19 0318 06/16/19 0356 06/16/19 0741  GLUCAP 115* 133* 132* 69* 116* 127*    BMET Recent Labs  Lab 06/15/19 0850 06/15/19 1538 06/16/19 0349  NA 131* 136 139  K 3.4* 3.2* 3.6  CL 99 100 102  CO2 17* 21* 24  BUN 56* 50* 51*  CREATININE 3.17* 3.06* 2.61*  GLUCOSE 139* 127* 143*    Liver Enzymes Recent Labs  Lab 06/12/19 0927 06/16/19 0349  AST 54* 60*  ALT 47* 34  ALKPHOS 285* 539*  BILITOT 2.2* 2.2*  ALBUMIN 3.5 1.9*    Electrolytes Recent Labs  Lab 06/14/19 0611 06/14/19 0611 06/15/19 0850 06/15/19 1538 06/16/19 0349  CALCIUM 8.0*   < > 7.8* 8.5* 8.8*  MG 2.1  --   --   --  1.8  PHOS 5.3*  --   --   --  4.3   < > = values in this  interval not displayed.    CBC Recent Labs  Lab 06/12/19 1854 06/12/19 1854 06/14/19 0611 06/14/19 0611 06/15/19 0301 06/15/19 0317 06/16/19 0349  WBC 14.7*  --  12.2*  --   --   --  10.5  HGB 10.5*   < > 9.0*   < > 10.2* 10.5* 10.8*  HCT 31.9*   < > 28.0*   < > 30.0* 31.0* 31.5*  PLT 202  --  193  --   --   --  269   < > = values in this interval not displayed.    ABG Recent Labs  Lab 06/13/19 1553 06/15/19 0301 06/15/19 0317  PHART 7.374 7.248* 7.328*  PCO2ART 36.6 43.0 31.2*  PO2ART 81.3* 25.0* 58.0*    Coag's Recent Labs  Lab 06/12/19 1854  APTT 45*  INR 1.5*    Sepsis Markers Recent Labs  Lab 06/12/19 1907 06/12/19 2210 06/15/19 1010  LATICACIDVEN 3.6* 2.7* 1.2    Cardiac Enzymes No results for input(s): TROPONINI, PROBNP in the last 168 hours.    Critical care time: The patient is critically ill with multiple organ systems failure and requires high complexity decision making for assessment and support, frequent evaluation and titration of therapies, application of advanced monitoring technologies and extensive interpretation of multiple databases.  Critical care time 35 mins. This represents my time independent of the NPs time taking care of the pt.  This is excluding procedures.    [facility] 06/16/2019, 8:52 AM

## 2019-06-17 ENCOUNTER — Inpatient Hospital Stay (HOSPITAL_COMMUNITY): Payer: Medicare Other

## 2019-06-17 DIAGNOSIS — N170 Acute kidney failure with tubular necrosis: Secondary | ICD-10-CM

## 2019-06-17 DIAGNOSIS — R4182 Altered mental status, unspecified: Secondary | ICD-10-CM

## 2019-06-17 LAB — COMPREHENSIVE METABOLIC PANEL
ALT: 66 U/L — ABNORMAL HIGH (ref 0–44)
AST: 145 U/L — ABNORMAL HIGH (ref 15–41)
Albumin: 2 g/dL — ABNORMAL LOW (ref 3.5–5.0)
Alkaline Phosphatase: 998 U/L — ABNORMAL HIGH (ref 38–126)
Anion gap: 14 (ref 5–15)
BUN: 59 mg/dL — ABNORMAL HIGH (ref 8–23)
CO2: 26 mmol/L (ref 22–32)
Calcium: 9.1 mg/dL (ref 8.9–10.3)
Chloride: 104 mmol/L (ref 98–111)
Creatinine, Ser: 2.49 mg/dL — ABNORMAL HIGH (ref 0.61–1.24)
GFR calc Af Amer: 31 mL/min — ABNORMAL LOW (ref >60)
GFR calc non Af Amer: 27 mL/min — ABNORMAL LOW (ref >60)
Glucose, Bld: 161 mg/dL — ABNORMAL HIGH (ref 70–99)
Potassium: 3.7 mmol/L (ref 3.5–5.1)
Sodium: 144 mmol/L (ref 135–145)
Total Bilirubin: 3.1 mg/dL — ABNORMAL HIGH (ref 0.3–1.2)
Total Protein: 6.6 g/dL (ref 6.5–8.1)

## 2019-06-17 LAB — BLOOD GAS, ARTERIAL
Acid-Base Excess: 2.9 mmol/L — ABNORMAL HIGH (ref 0.0–2.0)
Bicarbonate: 27.3 mmol/L (ref 20.0–28.0)
Drawn by: 137461
FIO2: 40
O2 Saturation: 93.9 %
Patient temperature: 37.5
pCO2 arterial: 46.1 mmHg (ref 32.0–48.0)
pH, Arterial: 7.394 (ref 7.350–7.450)
pO2, Arterial: 80.2 mmHg — ABNORMAL LOW (ref 83.0–108.0)

## 2019-06-17 LAB — CULTURE, RESPIRATORY W GRAM STAIN: Culture: NO GROWTH

## 2019-06-17 LAB — CBC
HCT: 29.3 % — ABNORMAL LOW (ref 39.0–52.0)
Hemoglobin: 9.9 g/dL — ABNORMAL LOW (ref 13.0–17.0)
MCH: 29.4 pg (ref 26.0–34.0)
MCHC: 33.8 g/dL (ref 30.0–36.0)
MCV: 86.9 fL (ref 80.0–100.0)
Platelets: 312 10*3/uL (ref 150–400)
RBC: 3.37 MIL/uL — ABNORMAL LOW (ref 4.22–5.81)
RDW: 14.3 % (ref 11.5–15.5)
WBC: 10.3 10*3/uL (ref 4.0–10.5)
nRBC: 0 % (ref 0.0–0.2)

## 2019-06-17 LAB — MAGNESIUM: Magnesium: 2 mg/dL (ref 1.7–2.4)

## 2019-06-17 LAB — CULTURE, BLOOD (ROUTINE X 2)
Culture: NO GROWTH
Culture: NO GROWTH
Special Requests: ADEQUATE
Special Requests: ADEQUATE

## 2019-06-17 LAB — GLUCOSE, CAPILLARY
Glucose-Capillary: 159 mg/dL — ABNORMAL HIGH (ref 70–99)
Glucose-Capillary: 304 mg/dL — ABNORMAL HIGH (ref 70–99)
Glucose-Capillary: 211 mg/dL — ABNORMAL HIGH (ref 70–99)
Glucose-Capillary: 120 mg/dL — ABNORMAL HIGH (ref 70–99)
Glucose-Capillary: 234 mg/dL — ABNORMAL HIGH (ref 70–99)
Glucose-Capillary: 269 mg/dL — ABNORMAL HIGH (ref 70–99)

## 2019-06-17 LAB — PHOSPHORUS: Phosphorus: 3.5 mg/dL (ref 2.5–4.6)

## 2019-06-17 LAB — D-DIMER, QUANTITATIVE: D-Dimer, Quant: 7.98 ug/mL-FEU — ABNORMAL HIGH (ref 0.00–0.50)

## 2019-06-17 LAB — TROPONIN I (HIGH SENSITIVITY): Troponin I (High Sensitivity): 27 ng/L — ABNORMAL HIGH (ref <18)

## 2019-06-17 MED ORDER — LORAZEPAM 2 MG/ML IJ SOLN
5.0000 mg | Freq: Once | INTRAMUSCULAR | Status: AC
  Administered 2019-06-17: 13:00:00 5 mg via INTRAVENOUS

## 2019-06-17 MED ORDER — LORAZEPAM 2 MG/ML IJ SOLN
INTRAMUSCULAR | Status: AC
  Filled 2019-06-17: qty 3

## 2019-06-17 MED ORDER — FUROSEMIDE 10 MG/ML IJ SOLN
40.0000 mg | Freq: Two times a day (BID) | INTRAMUSCULAR | Status: AC
  Administered 2019-06-17 (×2): 40 mg via INTRAVENOUS
  Filled 2019-06-17 (×2): qty 4

## 2019-06-17 MED ORDER — INSULIN ASPART 100 UNIT/ML ~~LOC~~ SOLN
0.0000 [IU] | SUBCUTANEOUS | Status: DC
  Administered 2019-06-17: 5 [IU] via SUBCUTANEOUS
  Administered 2019-06-17: 8 [IU] via SUBCUTANEOUS
  Administered 2019-06-17: 5 [IU] via SUBCUTANEOUS
  Administered 2019-06-17: 11 [IU] via SUBCUTANEOUS
  Administered 2019-06-18: 8 [IU] via SUBCUTANEOUS
  Administered 2019-06-18 – 2019-06-19 (×5): 5 [IU] via SUBCUTANEOUS
  Administered 2019-06-19: 3 [IU] via SUBCUTANEOUS
  Administered 2019-06-19: 8 [IU] via SUBCUTANEOUS
  Administered 2019-06-19: 5 [IU] via SUBCUTANEOUS
  Administered 2019-06-19 – 2019-06-20 (×2): 3 [IU] via SUBCUTANEOUS
  Administered 2019-06-20 (×2): 5 [IU] via SUBCUTANEOUS
  Administered 2019-06-20 – 2019-06-21 (×3): 3 [IU] via SUBCUTANEOUS
  Administered 2019-06-21: 5 [IU] via SUBCUTANEOUS
  Administered 2019-06-21 – 2019-06-25 (×7): 2 [IU] via SUBCUTANEOUS
  Administered 2019-06-25: 3 [IU] via SUBCUTANEOUS
  Administered 2019-06-26: 2 [IU] via SUBCUTANEOUS
  Administered 2019-06-26: 3 [IU] via SUBCUTANEOUS

## 2019-06-17 NOTE — Progress Notes (Signed)
Sedation restarted due to patient bucking the vent and biting the ETT.

## 2019-06-17 NOTE — Progress Notes (Signed)
EEG complete - results pending 

## 2019-06-17 NOTE — Progress Notes (Signed)
PULMONARY / CRITICAL CARE MEDICINE   NAME:  Scott Mcgee, MRN:  [id number], DOB:  Oct 26, 1960, LOS: 5 ADMISSION DATE:  06/12/2019, CONSULTATION DATE:  2/8 REFERRING MD:  Triad , CHIEF COMPLAINT:  sob  BRIEF HISTORY:    Scott Mcgee with  DM/HBP/prostate ca admit 2/8 with dysuria/low grade fever x 2 weeks PTA and high blood sugars/ trace Betahydroxybutyrate and low Na, rx with Insulin/ fluids admit to ICU on insulin drip/meropenem with increasing sob and abd distention > CT Abd s acute findings and pccm asked to see am 2/9.   HISTORY OF PRESENT ILLNESS   59 y.o. male with medical history significant of diabetes, hypertension, prostate cancer status post prostate resection, presents to the emergency room with a 2-week history of elevated blood sugars.  He reported that his sugars have been over 400.  He has been having intermittent fevers for the past few days.  He describes dysuria for the past 2 weeks.  No abdominal pain or flank pain.  He has had shortness of breath am of admit , but no cough.  He has been having hiccups intermittently for the past few weeks.  He has not had any vomiting or diarrhea.  No blurred vision.  He has had a distended abdomen, but wife reports that this is been present for several years now.  ED Course: He was evaluated emergency room where lab work showed that he was hyperglycemic, hyponatremic, elevated creatinine.  Urinalysis indicated possible infection.  CT of the abdomen pelvis performed showed questionable stranding around kidneys but otherwise no acute abnormalities.  He was started on insulin infusion as well as IV fluids and referred for admission.   SIGNIFICANT PAST MEDICAL HISTORY   AODM Prostate ca s/p prostatectomy  SIGNIFICANT EVENTS:  2/9: transferred from [facility]  STUDIES:   CT abd  2/8: Cholelithiasis without complicating factors. Mild perinephric stranding is noted on the left without obstructive change. Underlying UTI could not be totally  excluded. Mild bibasilar atelectasis.  2/9 echo: LVEF 60-65%, LVH   CULTURES:  Covid 19 PCR   2/8 neg  Resp viral panel 2/8 neg  MRSA PCR  2/8  Neg  BC x 2  2/8 >>>ngtd UC 2/8 >>> ecoli resp cx 2/11: ngtd  ANTIBIOTICS:  Meropenem 2/8 >>>2/10 Ctx 2/10->  LINES/TUBES:   2/9 ett CONSULTANTS:  PCCM    Scheduled Meds: . chlorhexidine gluconate (MEDLINE KIT)  15 mL Mouth Rinse BID  . Chlorhexidine Gluconate Cloth  6 each Topical Daily  . heparin injection (subcutaneous)  5,000 Units Subcutaneous Q8H  . insulin aspart  0-15 Units Subcutaneous Q4H  . ipratropium-albuterol  3 mL Nebulization Q6H  . mouth rinse  15 mL Mouth Rinse 10 times per day  . pantoprazole sodium  40 mg Per Tube Daily  . polyethylene glycol  17 g Per Tube Daily  . senna-docusate  1 tablet Per Tube BID   Continuous Infusions: . sodium chloride 10 mL/hr at 06/17/19 1000  . cefTRIAXone (ROCEPHIN)  IV Stopped (06/16/19 2044)  . dexmedetomidine (PRECEDEX) IV infusion 0.8 mcg/kg/hr (06/17/19 1000)  . feeding supplement (VITAL AF 1.2 CAL) Stopped (06/17/19 0955)  . fentaNYL infusion INTRAVENOUS Stopped (06/17/19 0742)   PRN Meds:.acetaminophen **OR** acetaminophen, albuterol, dextrose, fentaNYL, midazolam   SUBJECTIVE:  Aggressive diuresis over the last 48 hours Improved mental status  CONSTITUTIONAL: BP 109/63   Pulse Scott   Temp 99.2 F (37.3 C) (Oral)   Resp 17   Ht 5' 6"  (1.676 m)  Wt 81.7 kg   SpO2 96%   BMI 29.07 kg/m   I/O last 3 completed shifts: In: 4007.8 [I.V.:1983.9; NG/GT:1665.5; IV Piggyback:358.4] Out: 7450 [Urine:7450]    Intake/Output Summary (Last 24 hours) at 06/17/2019 1104 Last data filed at 06/17/2019 1000 Gross per 24 hour  Intake 2645.47 ml  Output 3700 ml  Net -1054.53 ml    CVP:  [10 mmHg] 10 mmHg  Vent Mode: PRVC FiO2 (%):  [40 %-60 %] 40 % Set Rate:  [18 bmp] 18 bmp Vt Set:  [510 mL] 510 mL PEEP:  [5 cmH20-10 cmH20] 10 cmH20 Plateau Pressure:  [22 ZOX09-60  cmH20] 28 cmH20  PHYSICAL EXAM: General: Ill-appearing man, ventilated Neuro: Awake, nods to questions, able to communicate.  Has been writing notes HEENT: ET tube in place, good position, no significant secretions Cardiovascular: Distant, regular Lungs: Small breaths but for the most part clear, few basilar crackles, no wheezes Abdomen: Protuberant, distended, tympanic.  Mild tenderness on palpation, hypoactive bowel sounds Musculoskeletal: No deformity, no significant edema Skin: Scars on lower extremities, no rash    RESOLVED PROBLEM LIST  Anion gap metabolic acidosis Hyponatremia Hypotension resolved  ASSESSMENT AND PLAN   1)  Acute on chronic renal failure in setting of longstanding dm (? No longer on metformin? ) and uti with possible sepsis/ vol depletion.  Plan to continue intermittent diuresis as his blood pressure and renal function will tolerate Follow urine output, BMP  2)  Hyponatremia:  Continue to hold hydrochlorothiazide  3) complicated ecoli uti/ with severe sepsis, no shock>   Antibiotics transitioned to ceftriaxone, plan to finish 7 days total No evidence ureteral obstruction on CT abdomen   4) acute hypoxemic resp failure  PRVC, 8 cc/kg Continue to diurese as he can tolerate Decrease PEEP to 8, respiratory rate to 12 on 2/13.  Hopefully can decrease PEEP further and start to move towards spontaneous breathing trials in the next 24 hours Follow chest x-ray 2/14 Follow sputum culture results.  No clear indication to broaden antibiotics at this time  HFpEF:  Continue diuresis as his hemodynamics and renal function will tolerate Tight blood pressure control  Normocytic anemia:  Follow CBC intermittently  Abdominal distention:  Bowel regimen as ordered Try low intermittent suction on G-tube to decompress  Best Practice / Goals of Care / Disposition.   DVT PROPHYLAXIS:  Hep sq  SUP: Protonix  NUTRITION:tf MOBILITY: bedrest GOALS OF CARE: full  code FAMILY DISCUSSIONS: wife updated at bedside 2/12 DISPOSITION: ICU  LABS  Glucose Recent Labs  Lab 06/16/19 1113 06/16/19 1541 06/16/19 2003 06/16/19 2351 06/17/19 0356 06/17/19 0803  GLUCAP 221* 121* 181* 191* 159* 304*    BMET Recent Labs  Lab 06/16/19 0349 06/16/19 1139 06/17/19 0403  NA 139 137 144  K 3.6 4.8 3.7  CL 102 101 104  CO2 24 21* 26  BUN 51* 54* 59*  CREATININE 2.61* 2.64* 2.49*  GLUCOSE 143* 268* 161*    Liver Enzymes Recent Labs  Lab 06/12/19 0927 06/16/19 0349 06/17/19 0403  AST 54* 60* 145*  ALT 47* 34 66*  ALKPHOS 285* 539* 998*  BILITOT 2.2* 2.2* 3.1*  ALBUMIN 3.5 1.9* 2.0*    Electrolytes Recent Labs  Lab 06/14/19 0611 06/15/19 0850 06/16/19 0349 06/16/19 1139 06/17/19 0403  CALCIUM 8.0*   < > 8.8* 8.9 9.1  MG 2.1  --  1.8 1.8 2.0  PHOS 5.3*  --  4.3  --  3.5   < > = values  in this interval not displayed.    CBC Recent Labs  Lab 06/14/19 0611 06/15/19 0301 06/15/19 0317 06/16/19 0349 06/17/19 0403  WBC 12.2*  --   --  10.5 10.3  HGB 9.0*   < > 10.5* 10.8* 9.9*  HCT 28.0*   < > 31.0* 31.5* 29.3*  PLT 193  --   --  269 312   < > = values in this interval not displayed.    ABG Recent Labs  Lab 06/13/19 1553 06/15/19 0301 06/15/19 0317  PHART 7.374 7.248* 7.328*  PCO2ART 36.6 43.0 31.2*  PO2ART 81.3* 25.0* 58.0*    Coag's Recent Labs  Lab 06/12/19 1854  APTT 45*  INR 1.5*    Sepsis Markers Recent Labs  Lab 06/12/19 1907 06/12/19 2210 06/15/19 1010  LATICACIDVEN 3.6* 2.7* 1.2    Cardiac Enzymes No results for input(s): TROPONINI, PROBNP in the last 168 hours.    Critical care time: The patient is critically ill with multiple organ systems failure and requires high complexity decision making for assessment and support, frequent evaluation and titration of therapies, application of advanced monitoring technologies and extensive interpretation of multiple databases.  Critical care time 32  mins. This represents my time independent of the NPs time taking care of the pt. This is excluding procedures.     Baltazar Apo, MD, PhD 06/17/2019, 11:11 AM [facility] [phone] or if no answer [phone]

## 2019-06-17 NOTE — Progress Notes (Signed)
PCCM interval note  S: Called urgently to patient's room after acute change in overall status.  He was awake, interacting with the nurse, mouthing words and then developed bradycardia, change in level of consciousness, began biting ET tube.  No overt tonic-clonic movement.  Associated with acute desaturation  O: Initial evaluation in the room patient appears to be biting his ET tube.  Oxygen saturations are not picking up on monitor.  Heart rate 90s.  Eyes are upward deviated and he is unresponsive to voice or pain, will not interact.  No change in lung heart or abdominal exam.   Acute decompensation, principally neurological but was associated with a brief bradycardia event and desaturation.  Question seizure activity.  Consider acute cardiac event or respiratory event, consider PE.  -FiO2 uptitrated to 1.00 -Stat Versed given, followed by Ativan in the event that this is seizure activity -Stat chest x-ray and ECG now -Head CT when stable to travel -EEG -Stat troponin, CBC, D-dimer; defer empiric anticoagulation until after we know head CT results -Consider neurology evaluation  Independent CC time 25 minutes  Baltazar Apo, MD, PhD 06/17/2019, 1:06 PM [facility] [phone] or if no answer [phone]

## 2019-06-17 NOTE — Procedures (Signed)
Patient Name: Scott Mcgee  MRN: [id number]  Epilepsy Attending: Lora Havens  Referring Physician/Provider: Dr Baltazar Apo Date: 06/17/2019 Duration: 24.15 mins  Patient history: 59 y.o.malewith medical history significant ofdiabetes, hypertension, prostate cancer status post prostate resection, presents to the emergency room with a 2-week history of elevated blood sugars. He reported that his sugars have been over 400. Today, had acute change in overall status.  He was awake, interacting with the nurse, mouthing words and then developed bradycardia, change in level of consciousness, began biting ET tube.  No overt tonic-clonic movement.  Associated with acute desaturation. EEG to evaluate for seizure.  Level of alertness: comatose  AEDs during EEG study: versed  Technical aspects: This EEG study was done with scalp electrodes positioned according to the 10-20 International system of electrode placement. Electrical activity was acquired at a sampling rate of 500Hz  and reviewed with a high frequency filter of 70Hz  and a low frequency filter of 1Hz . EEG data were recorded continuously and digitally stored.   DESCRIPTION: EEG showed continuous generalized background suppression. EEG was not reactive to noxious stimulation. Hyperventilation and photic stimulation were not performed.  ABNORMALITY - Background suppression, generalized   IMPRESSION: This study is suggestive of profound diffuse encephalopathy, non specific to etiology. No seizures or epileptiform discharges were seen throughout the recording.     Mariame Rybolt Barbra Sarks

## 2019-06-17 NOTE — Progress Notes (Signed)
eLink Physician-Brief Progress Note Patient Name: SURAJ RAMDASS DOB: 1961-02-13 MRN: [id number]   Date of Service  06/17/2019  HPI/Events of Note  Multiple watery stools - Request for Flexiseal.   eICU Interventions  Will order: 1. Place Flexiseal.      Intervention Category Major Interventions: Other:  Jimmylee Ratterree Cornelia Copa 06/17/2019, 5:02 AM

## 2019-06-18 LAB — GLUCOSE, CAPILLARY
Glucose-Capillary: 207 mg/dL — ABNORMAL HIGH (ref 70–99)
Glucose-Capillary: 266 mg/dL — ABNORMAL HIGH (ref 70–99)
Glucose-Capillary: 209 mg/dL — ABNORMAL HIGH (ref 70–99)
Glucose-Capillary: 234 mg/dL — ABNORMAL HIGH (ref 70–99)
Glucose-Capillary: 227 mg/dL — ABNORMAL HIGH (ref 70–99)

## 2019-06-18 LAB — BASIC METABOLIC PANEL
Anion gap: 15 (ref 5–15)
BUN: 70 mg/dL — ABNORMAL HIGH (ref 8–23)
CO2: 28 mmol/L (ref 22–32)
Calcium: 9.2 mg/dL (ref 8.9–10.3)
Chloride: 103 mmol/L (ref 98–111)
Creatinine, Ser: 2.37 mg/dL — ABNORMAL HIGH (ref 0.61–1.24)
GFR calc Af Amer: 33 mL/min — ABNORMAL LOW (ref >60)
GFR calc non Af Amer: 28 mL/min — ABNORMAL LOW (ref >60)
Glucose, Bld: 270 mg/dL — ABNORMAL HIGH (ref 70–99)
Potassium: 3.3 mmol/L — ABNORMAL LOW (ref 3.5–5.1)
Sodium: 146 mmol/L — ABNORMAL HIGH (ref 135–145)

## 2019-06-18 LAB — CBC
HCT: 29.8 % — ABNORMAL LOW (ref 39.0–52.0)
Hemoglobin: 9.9 g/dL — ABNORMAL LOW (ref 13.0–17.0)
MCH: 29.4 pg (ref 26.0–34.0)
MCHC: 33.2 g/dL (ref 30.0–36.0)
MCV: 88.4 fL (ref 80.0–100.0)
Platelets: 331 10*3/uL (ref 150–400)
RBC: 3.37 MIL/uL — ABNORMAL LOW (ref 4.22–5.81)
RDW: 15.1 % (ref 11.5–15.5)
WBC: 11.1 10*3/uL — ABNORMAL HIGH (ref 4.0–10.5)
nRBC: 0 % (ref 0.0–0.2)

## 2019-06-18 LAB — MAGNESIUM: Magnesium: 2 mg/dL (ref 1.7–2.4)

## 2019-06-18 LAB — BRAIN NATRIURETIC PEPTIDE: B Natriuretic Peptide: 295.5 pg/mL — ABNORMAL HIGH (ref 0.0–100.0)

## 2019-06-18 MED ORDER — INSULIN ASPART 100 UNIT/ML ~~LOC~~ SOLN
3.0000 [IU] | SUBCUTANEOUS | Status: DC
  Administered 2019-06-18 – 2019-06-20 (×11): 3 [IU] via SUBCUTANEOUS

## 2019-06-18 MED ORDER — POTASSIUM CHLORIDE 20 MEQ/15ML (10%) PO SOLN
40.0000 meq | Freq: Once | ORAL | Status: AC
  Administered 2019-06-18: 40 meq via ORAL
  Filled 2019-06-18: qty 30

## 2019-06-18 MED ORDER — INSULIN GLARGINE 100 UNIT/ML ~~LOC~~ SOLN
15.0000 [IU] | Freq: Every day | SUBCUTANEOUS | Status: DC
  Administered 2019-06-18 – 2019-06-24 (×6): 15 [IU] via SUBCUTANEOUS
  Filled 2019-06-18 (×9): qty 0.15

## 2019-06-18 MED ORDER — FUROSEMIDE 10 MG/ML IJ SOLN
40.0000 mg | Freq: Once | INTRAMUSCULAR | Status: AC
  Administered 2019-06-18: 11:00:00 40 mg via INTRAVENOUS
  Filled 2019-06-18: qty 4

## 2019-06-18 NOTE — Progress Notes (Signed)
PULMONARY / CRITICAL CARE MEDICINE   NAME:  Scott Mcgee, MRN:  [id number], DOB:  1960/06/05, LOS: 6 ADMISSION DATE:  06/12/2019, CONSULTATION DATE:  2/8 REFERRING MD:  Triad , CHIEF COMPLAINT:  sob  BRIEF HISTORY:    65 yobm with  DM/HBP/prostate ca admit 2/8 with dysuria/low grade fever x 2 weeks PTA and high blood sugars/ trace Betahydroxybutyrate and low Na, rx with Insulin/ fluids admit to ICU on insulin drip/meropenem with increasing sob and abd distention > CT Abd s acute findings and pccm asked to see am 2/9.   HISTORY OF PRESENT ILLNESS   59 y.o. male with medical history significant of diabetes, hypertension, prostate cancer status post prostate resection, presents to the emergency room with a 2-week history of elevated blood sugars.  He reported that his sugars have been over 400.  He has been having intermittent fevers for the past few days.  He describes dysuria for the past 2 weeks.  No abdominal pain or flank pain.  He has had shortness of breath am of admit , but no cough.  He has been having hiccups intermittently for the past few weeks.  He has not had any vomiting or diarrhea.  No blurred vision.  He has had a distended abdomen, but wife reports that this is been present for several years now.  ED Course: He was evaluated emergency room where lab work showed that he was hyperglycemic, hyponatremic, elevated creatinine.  Urinalysis indicated possible infection.  CT of the abdomen pelvis performed showed questionable stranding around kidneys but otherwise no acute abnormalities.  He was started on insulin infusion as well as IV fluids and referred for admission.   SIGNIFICANT PAST MEDICAL HISTORY   AODM Prostate ca s/p prostatectomy  SIGNIFICANT EVENTS:  2/9: transferred from [facility]  STUDIES:   CT abd  2/8: Cholelithiasis without complicating factors. Mild perinephric stranding is noted on the left without obstructive change. Underlying UTI could not be totally  excluded. Mild bibasilar atelectasis.  2/9 echo: LVEF 60-65%, LVH   CULTURES:  Covid 19 PCR   2/8 neg  Resp viral panel 2/8 neg  MRSA PCR  2/8  Neg  BC x 2  2/8 >>>ngtd UC 2/8 >>> ecoli resp cx 2/11: ngtd  ANTIBIOTICS:  Meropenem 2/8 >>>2/10 Ctx 2/10->  LINES/TUBES:   2/9 ett CONSULTANTS:  PCCM    Scheduled Meds: . chlorhexidine gluconate (MEDLINE KIT)  15 mL Mouth Rinse BID  . Chlorhexidine Gluconate Cloth  6 each Topical Daily  . heparin injection (subcutaneous)  5,000 Units Subcutaneous Q8H  . insulin aspart  0-15 Units Subcutaneous Q4H  . insulin aspart  3 Units Subcutaneous Q4H  . insulin glargine  15 Units Subcutaneous Daily  . ipratropium-albuterol  3 mL Nebulization Q6H  . mouth rinse  15 mL Mouth Rinse 10 times per day  . pantoprazole sodium  40 mg Per Tube Daily  . polyethylene glycol  17 g Per Tube Daily  . potassium chloride  40 mEq Oral Once  . senna-docusate  1 tablet Per Tube BID   Continuous Infusions: . sodium chloride 10 mL/hr at 06/18/19 1000  . cefTRIAXone (ROCEPHIN)  IV Stopped (06/17/19 1802)  . dexmedetomidine (PRECEDEX) IV infusion 1 mcg/kg/hr (06/18/19 1000)  . feeding supplement (VITAL AF 1.2 CAL) 70 mL/hr at 06/18/19 0600  . fentaNYL infusion INTRAVENOUS Stopped (06/18/19 0808)   PRN Meds:.acetaminophen **OR** acetaminophen, albuterol, dextrose, fentaNYL, midazolam   SUBJECTIVE:  He had a period of altered mental status,  acute desaturation and bradycardia yesterday afternoon.  Happened suddenly after he had been interacting normally.  A head CT was unremarkable, EEG post event did not show any evidence of seizure activity. Mental status now improved, back on low-dose Precedex, received some low-dose fentanyl this morning Intermittently tolerating PSV but then developed rapid shallow breathing  CONSTITUTIONAL: BP 121/69   Pulse 69   Temp 97.6 F (36.4 C) (Oral)   Resp 14   Ht 5' 6"  (1.676 m)   Wt 81.7 kg   SpO2 98%   BMI 29.07  kg/m   I/O last 3 completed shifts: In: 3755.4 [I.V.:1161.5; Other:275; NG/GT:2043.8; IV Piggyback:275.1] Out: 5675 [Urine:5675]    Intake/Output Summary (Last 24 hours) at 06/18/2019 1037 Last data filed at 06/18/2019 1000 Gross per 24 hour  Intake 1972.55 ml  Output 2875 ml  Net -902.45 ml       Vent Mode: CPAP;PSV FiO2 (%):  [40 %] 40 % Set Rate:  [12 bmp] 12 bmp Vt Set:  [510 mL] 510 mL PEEP:  [8 cmH20] 8 cmH20 Pressure Support:  [10 cmH20] 10 cmH20 Plateau Pressure:  [20 cmH20-25 cmH20] 20 cmH20  PHYSICAL EXAM: General: Acute and chronically ill-appearing man, ventilated Neuro: Awake again, nodding to questions and following commands, probably a little more lethargic than 2/13 HEENT: ET tube in place.  No secretions. Cardiovascular: Regular, distant Lungs: Small breaths, decreased at both bases, no wheezing Abdomen: Large protuberant abdomen, somewhat distended and tympanic, nontender with palpation, hypoactive bowel sounds Musculoskeletal: No deformities or edema Skin: No rash    RESOLVED PROBLEM LIST  Anion gap metabolic acidosis Hyponatremia Hypotension resolved  ASSESSMENT AND PLAN   1)  Acute on chronic renal failure in setting of longstanding dm (? No longer on metformin? ) and uti with possible sepsis/ vol depletion.  Continues to slowly improve, creatinine 2.37 on 2/14 Continue intermittent diuretics as his blood pressure and renal function will tolerate, +187 cc for the hospitalization Follow urine output, BMP Received Lasix x2 on 2/13, repeat x1 today  2)  Hyponatremia:  Hydrochlorothiazide on hold Probably will need to start free water in the next 1 to 2 days especially if we are able continue to diurese  3) complicated ecoli uti/ with severe sepsis, no shock>   Continue ceftriaxone, plan to finish 7 days total antibiotics No evidence ureteral obstruction on CT abdomen  4) acute hypoxemic resp failure  PRVC, 8 cc/kg Continue to diurese as he  can tolerate Decrease PEEP to 8, respiratory rate to 12 on 2/13.  Hopefully can decrease PEEP further and start to move towards spontaneous breathing trials in the next 24 hours Follow chest x-ray 2/14 Follow sputum culture results.  No clear indication to broaden antibiotics at this time  Acute encephalopathy 2/13, question seizure.  He does not have a history of seizures and none was seen on subsequent EEG Attempted minimize sedation and follow mental status closely  HFpEF:  Tight blood pressure control Continue diuresis as his hemodynamics and renal function will tolerate  Normocytic anemia:  Follow CBC  Abdominal distention:  Bowel regimen as ordered Minimal output with G-tube suctioning.  Best Practice / Goals of Care / Disposition.   DVT PROPHYLAXIS:  Hep sq  SUP: Protonix  NUTRITION:tf MOBILITY: bedrest GOALS OF CARE: full code FAMILY DISCUSSIONS: Spouse updated at bedside on 2/14 DISPOSITION: ICU  LABS  Glucose Recent Labs  Lab 06/17/19 1148 06/17/19 1620 06/17/19 1959 06/17/19 2320 06/18/19 0312 06/18/19 0754  GLUCAP 211* 120*  234* 269* 207* 266*    BMET Recent Labs  Lab 06/16/19 1139 06/17/19 0403 06/18/19 0536  NA 137 144 146*  K 4.8 3.7 3.3*  CL 101 104 103  CO2 21* 26 28  BUN 54* 59* 70*  CREATININE 2.64* 2.49* 2.37*  GLUCOSE 268* 161* 270*    Liver Enzymes Recent Labs  Lab 06/12/19 0927 06/16/19 0349 06/17/19 0403  AST 54* 60* 145*  ALT 47* 34 66*  ALKPHOS 285* 539* 998*  BILITOT 2.2* 2.2* 3.1*  ALBUMIN 3.5 1.9* 2.0*    Electrolytes Recent Labs  Lab 06/14/19 0611 06/15/19 0850 06/16/19 0349 06/16/19 0349 06/16/19 1139 06/17/19 0403 06/18/19 0536  CALCIUM 8.0*   < > 8.8*   < > 8.9 9.1 9.2  MG 2.1  --  1.8   < > 1.8 2.0 2.0  PHOS 5.3*  --  4.3  --   --  3.5  --    < > = values in this interval not displayed.    CBC Recent Labs  Lab 06/16/19 0349 06/17/19 0403 06/18/19 0536  WBC 10.5 10.3 11.1*  HGB 10.8* 9.9* 9.9*   HCT 31.5* 29.3* 29.8*  PLT 269 312 331    ABG Recent Labs  Lab 06/15/19 0301 06/15/19 0317 06/17/19 1315  PHART 7.248* 7.328* 7.394  PCO2ART 43.0 31.2* 46.1  PO2ART 25.0* 58.0* 80.2*    Coag's Recent Labs  Lab 06/12/19 1854  APTT 45*  INR 1.5*    Sepsis Markers Recent Labs  Lab 06/12/19 1907 06/12/19 2210 06/15/19 1010  LATICACIDVEN 3.6* 2.7* 1.2    Cardiac Enzymes No results for input(s): TROPONINI, PROBNP in the last 168 hours.    Critical care time: The patient is critically ill with multiple organ systems failure and requires high complexity decision making for assessment and support, frequent evaluation and titration of therapies, application of advanced monitoring technologies and extensive interpretation of multiple databases.  Critical care time 35 mins. This represents my time independent of the NPs time taking care of the pt. This is excluding procedures.     Baltazar Apo, MD, PhD 06/18/2019, 10:37 AM [facility] [phone] or if no answer [phone]

## 2019-06-19 ENCOUNTER — Inpatient Hospital Stay (HOSPITAL_COMMUNITY): Payer: Medicare Other

## 2019-06-19 LAB — GLUCOSE, CAPILLARY
Glucose-Capillary: 110 mg/dL — ABNORMAL HIGH (ref 70–99)
Glucose-Capillary: 153 mg/dL — ABNORMAL HIGH (ref 70–99)
Glucose-Capillary: 197 mg/dL — ABNORMAL HIGH (ref 70–99)
Glucose-Capillary: 207 mg/dL — ABNORMAL HIGH (ref 70–99)
Glucose-Capillary: 250 mg/dL — ABNORMAL HIGH (ref 70–99)
Glucose-Capillary: 282 mg/dL — ABNORMAL HIGH (ref 70–99)

## 2019-06-19 LAB — CBC
HCT: 32 % — ABNORMAL LOW (ref 39.0–52.0)
Hemoglobin: 10.3 g/dL — ABNORMAL LOW (ref 13.0–17.0)
MCH: 29.1 pg (ref 26.0–34.0)
MCHC: 32.2 g/dL (ref 30.0–36.0)
MCV: 90.4 fL (ref 80.0–100.0)
Platelets: 367 10*3/uL (ref 150–400)
RBC: 3.54 MIL/uL — ABNORMAL LOW (ref 4.22–5.81)
RDW: 15.3 % (ref 11.5–15.5)
WBC: 12.3 10*3/uL — ABNORMAL HIGH (ref 4.0–10.5)
nRBC: 0 % (ref 0.0–0.2)

## 2019-06-19 LAB — BASIC METABOLIC PANEL
Anion gap: 17 — ABNORMAL HIGH (ref 5–15)
BUN: 62 mg/dL — ABNORMAL HIGH (ref 8–23)
CO2: 31 mmol/L (ref 22–32)
Calcium: 9.8 mg/dL (ref 8.9–10.3)
Chloride: 105 mmol/L (ref 98–111)
Creatinine, Ser: 1.85 mg/dL — ABNORMAL HIGH (ref 0.61–1.24)
GFR calc Af Amer: 45 mL/min — ABNORMAL LOW (ref >60)
GFR calc non Af Amer: 38 mL/min — ABNORMAL LOW (ref >60)
Glucose, Bld: 195 mg/dL — ABNORMAL HIGH (ref 70–99)
Potassium: 3.5 mmol/L (ref 3.5–5.1)
Sodium: 153 mmol/L — ABNORMAL HIGH (ref 135–145)

## 2019-06-19 LAB — MAGNESIUM: Magnesium: 2.1 mg/dL (ref 1.7–2.4)

## 2019-06-19 MED ORDER — FREE WATER
200.0000 mL | Freq: Four times a day (QID) | Status: DC
  Administered 2019-06-19 – 2019-06-20 (×4): 200 mL

## 2019-06-19 MED ORDER — DEXTROSE 5 % IV BOLUS: 250.0000 mL | Freq: Once | INTRAVENOUS | Status: DC

## 2019-06-19 NOTE — Progress Notes (Signed)
Inpatient Diabetes Program Recommendations  AACE/ADA: New Consensus Statement on Inpatient Glycemic Control (2015)  Target Ranges:  Prepandial:   less than 140 mg/dL      Peak postprandial:   less than 180 mg/dL (1-2 hours)      Critically ill patients:  140 - 180 mg/dL   Results for Scott Mcgee, Scott Mcgee" (MRN [id number]) as of 06/19/2019 13:16  Ref. Range 06/19/2019 00:28 06/19/2019 04:28 06/19/2019 08:32 06/19/2019 11:22  Glucose-Capillary Latest Ref Range: 70 - 99 mg/dL 207 (H)  8 units NOVOLOG  153 (H)  6 units NOVOLOG  197 (H)  6 units NOVOLOG +  15 units LANTUS  282 (H)  11 units NOVOLOG     Home DM Meds: Metformin 500 mg Daily    Current Orders: Lantus 15 units Daily       Novolog Moderate Correction Scale/ SSI (0-15 units) Q4 hours     Novolog 3 units Q4 hours    Lantus and Novolog TF coverage both started yest at 12pm.   Remains on Vent--Getting TF 70cc/hr.    MD- May consider increasing Novolog tube feed coverage to:   Novolog 6 units Q4 hours  HOLD if tube feeds HELD for any reason     --Will follow patient during hospitalization--  Wyn Quaker RN, MSN, CDE Diabetes Coordinator Inpatient Glycemic Control Team Team Pager: [phone] (8a-5p)

## 2019-06-19 NOTE — Progress Notes (Signed)
PULMONARY / CRITICAL CARE MEDICINE   NAME:  Scott Mcgee, MRN:  [id number], DOB:  02/18/1961, LOS: 7 ADMISSION DATE:  06/12/2019, CONSULTATION DATE:  06/12/2019 REFERRING MD:  Triad CHIEF COMPLAINT:  sob  BRIEF HISTORY:    57 yobm with  DM/HBP/prostate ca admit 2/8 with dysuria/low grade fever x 2 weeks PTA and high blood sugars/ trace Betahydroxybutyrate and low Na, rx with Insulin/ fluids admit to ICU on insulin drip/meropenem with increasing sob and abd distention > CT Abd s acute findings and pccm asked to see am 2/9.   HISTORY OF PRESENT ILLNESS   59 y.o. male with medical history significant of diabetes, hypertension, prostate cancer status post prostate resection, presents to the emergency room with a 2-week history of elevated blood sugars.  He reported that his sugars have been over 400.  He has been having intermittent fevers for the past few days.  He describes dysuria for the past 2 weeks.  No abdominal pain or flank pain.  He has had shortness of breath am of admit , but no cough.  He has been having hiccups intermittently for the past few weeks.  He has not had any vomiting or diarrhea.  No blurred vision.  He has had a distended abdomen, but wife reports that this is been present for several years now.  ED Course: He was evaluated emergency room where lab work showed that he was hyperglycemic, hyponatremic, elevated creatinine.  Urinalysis indicated possible infection.  CT of the abdomen pelvis performed showed questionable stranding around kidneys but otherwise no acute abnormalities.  He was started on insulin infusion as well as IV fluids and referred for admission.   SIGNIFICANT PAST MEDICAL HISTORY   AODM Prostate ca s/p prostatectomy  SIGNIFICANT EVENTS:  2/9: transferred from [facility]  STUDIES:   CT abd  2/8: Cholelithiasis without complicating factors. Mild perinephric stranding is noted on the left without obstructive change. Underlying UTI could not be totally  excluded. Mild bibasilar atelectasis. 2/9 echo: LVEF 60-65%, LVH 2/13 EEG:   CULTURES:  Covid 19 PCR   2/8 neg  Resp viral panel 2/8 neg  MRSA PCR  2/8  Neg  BC x 2  2/8 >>>ngtd UC 2/8 >>> ecoli resp cx 2/11: ngtd  ANTIBIOTICS:  Meropenem 2/8 - 2/10 Ctx 2/10->2/16  LINES/TUBES:  2/9 ETT  CONSULTANTS:  PCCM    Scheduled Meds: . chlorhexidine gluconate (MEDLINE KIT)  15 mL Mouth Rinse BID  . Chlorhexidine Gluconate Cloth  6 each Topical Daily  . heparin injection (subcutaneous)  5,000 Units Subcutaneous Q8H  . insulin aspart  0-15 Units Subcutaneous Q4H  . insulin aspart  3 Units Subcutaneous Q4H  . insulin glargine  15 Units Subcutaneous Daily  . ipratropium-albuterol  3 mL Nebulization Q6H  . mouth rinse  15 mL Mouth Rinse 10 times per day  . pantoprazole sodium  40 mg Per Tube Daily  . polyethylene glycol  17 g Per Tube Daily  . senna-docusate  1 tablet Per Tube BID   Continuous Infusions: . sodium chloride 10 mL/hr at 06/19/19 0900  . cefTRIAXone (ROCEPHIN)  IV Stopped (06/18/19 1753)  . dexmedetomidine (PRECEDEX) IV infusion 1.2 mcg/kg/hr (06/19/19 0900)  . feeding supplement (VITAL AF 1.2 CAL) 1,000 mL (06/19/19 0938)  . fentaNYL infusion INTRAVENOUS 100 mcg/hr (06/19/19 0900)   PRN Meds:.acetaminophen **OR** acetaminophen, albuterol, dextrose, fentaNYL, midazolam   SUBJECTIVE:  Patient was alert this morning and communicating via writing on a piece of paper.  His  writing did not always make sense, but he was able to write some words and communicate with [place] that he would like [place] to call his wife.  Currently on 1.2 of precedex and 100 of fentanyl.  CONSTITUTIONAL: BP 124/73   Pulse 75   Temp 97.7 F (36.5 C) (Axillary)   Resp 18   Ht 5' 6"  (1.676 m)   Wt 74.7 kg   SpO2 100%   BMI 26.58 kg/m   I/O last 3 completed shifts: In: 3561.1 [I.V.:876.1; Other:275; NG/GT:2310; IV Piggyback:100] Out: 5775 [Urine:5775]    Intake/Output Summary (Last 24  hours) at 06/19/2019 1001 Last data filed at 06/19/2019 0900 Gross per 24 hour  Intake 2155.81 ml  Output 4525 ml  Net -2369.19 ml       Vent Mode: PSV;CPAP FiO2 (%):  [40 %] 40 % Set Rate:  [12 bmp] 12 bmp Vt Set:  [510 mL] 510 mL PEEP:  [5 cmH20-8 cmH20] 5 cmH20 Pressure Support:  [18 cmH20] 18 cmH20 Plateau Pressure:  [21 cmH20-24 cmH20] 24 cmH20  PHYSICAL EXAM: General: Chronically ill-appearing man, ventilated, but awake and communicative Neuro: Awake, following commands, writing notes on paper that are about 50% comprehensible HEENT: ET tube in place Cardiovascular: RRR Lungs: Coarse breath sounds bilaterally Abdomen: Large protuberant abdomen Musculoskeletal: No deformities or edema Skin: No rash; scars on bilateral lower legs  RESOLVED PROBLEM LIST  Anion gap metabolic acidosis Hyponatremia Hypotension resolved  ASSESSMENT AND PLAN   Acute hypoxemic resp failure: PRVC.  Hopefully can continue to decrease PEEP further and start to move towards spontaneous breathing trials in the next 24 hours.  Resp culture from 2/11 shows no growth to date.  Mild increase in WBCs over the last few days and CXR shows patchy bibasilar airspace opacities that look better on the right and worse on the left compared to previous.  Some coarse breath sounds on exam today.  Patient currently afebrile.  Will CTM closely for any sign of infection. -CXR 2/15 -Monitor vitals for fever -daily CBC  Acute on chronic renal failure: longstanding dm (? No longer on metformin? ) and UTI with possible sepsis vol depletion.  Continues to slowly improve, creatinine 1.85 on 2/15.  Received Lasix x2 on 2/13, x1 on 2/14.  Now net of -2 L since admission.   -Hold diuretics today -Follow urine output, BMP -FW  863OT q6 hours  Complicated E .Coli UTI w/ severe sepsis, no shock: No evidence ureteral obstruction on CT abdomen -Continue ceftriaxone, plan to finish 7 days total antibiotics (2/10 -  2/16)  Hypernatremia: Hydrochlorothiazide on hold.  Suspect my be a little fluid down following diuresis. -Hold diuretics today  Acute encephalopathy 2/13: ?seizure.  No h/o seizures and none seen on subsequent EEG.  No acute intracranial anomalies on head CT. -Attempt to minimize sedation and follow mental status closely  HFpEF:  -Tight blood pressure control -Monitor fluid status closely  Normocytic anemia:  -Follow CBC  Abdominal distention:  -Bowel regimen as ordered -Minimal output with G-tube suctioning.  Best Practice / Goals of Care / Disposition.   DVT PROPHYLAXIS:  Hep sq  SUP: Protonix  NUTRITION: tf MOBILITY: bedrest GOALS OF CARE: full code FAMILY DISCUSSIONS: S/w wife on the phone this morning (2/15) to give update DISPOSITION: ICU  LABS  Glucose Recent Labs  Lab 06/18/19 1159 06/18/19 1548 06/18/19 1955 06/19/19 0028 06/19/19 0428 06/19/19 0832  GLUCAP 209* 234* 227* 207* 153* 197*    BMET Recent Labs  Lab  06/17/19 0403 06/18/19 0536 06/19/19 0445  NA 144 146* 153*  K 3.7 3.3* 3.5  CL 104 103 105  CO2 26 28 31   BUN 59* 70* 62*  CREATININE 2.49* 2.37* 1.85*  GLUCOSE 161* 270* 195*    Liver Enzymes Recent Labs  Lab 06/16/19 0349 06/17/19 0403  AST 60* 145*  ALT 34 66*  ALKPHOS 539* 998*  BILITOT 2.2* 3.1*  ALBUMIN 1.9* 2.0*    Electrolytes Recent Labs  Lab 06/14/19 0611 06/15/19 0850 06/16/19 0349 06/16/19 1139 06/17/19 0403 06/18/19 0536 06/19/19 0445  CALCIUM 8.0*   < > 8.8*   < > 9.1 9.2 9.8  MG 2.1  --  1.8   < > 2.0 2.0 2.1  PHOS 5.3*  --  4.3  --  3.5  --   --    < > = values in this interval not displayed.    CBC Recent Labs  Lab 06/17/19 0403 06/18/19 0536 06/19/19 0445  WBC 10.3 11.1* 12.3*  HGB 9.9* 9.9* 10.3*  HCT 29.3* 29.8* 32.0*  PLT 312 331 367    ABG Recent Labs  Lab 06/15/19 0301 06/15/19 0317 06/17/19 1315  PHART 7.248* 7.328* 7.394  PCO2ART 43.0 31.2* 46.1  PO2ART 25.0* 58.0* 80.2*     Coag's Recent Labs  Lab 06/12/19 1854  APTT 45*  INR 1.5*    Sepsis Markers Recent Labs  Lab 06/12/19 1907 06/12/19 2210 06/15/19 1010  LATICACIDVEN 3.6* 2.7* 1.2    Cardiac Enzymes No results for input(s): TROPONINI, PROBNP in the last 168 hours.    Critical care time: The patient is critically ill with multiple organ systems failure and requires high complexity decision making for assessment and support, frequent evaluation and titration of therapies, application of advanced monitoring technologies and extensive interpretation of multiple databases.  Critical care time 45 mins. This represents my time independent of the NPs time taking care of the pt. This is excluding procedures.    Mallie Darting, MS4

## 2019-06-20 ENCOUNTER — Inpatient Hospital Stay (HOSPITAL_COMMUNITY): Payer: Medicare Other

## 2019-06-20 LAB — CBC WITH DIFFERENTIAL/PLATELET
Abs Immature Granulocytes: 0.21 10*3/uL — ABNORMAL HIGH (ref 0.00–0.07)
Basophils Absolute: 0.1 10*3/uL (ref 0.0–0.1)
Basophils Relative: 1 %
Eosinophils Absolute: 0.5 10*3/uL (ref 0.0–0.5)
Eosinophils Relative: 4 %
HCT: 32.2 % — ABNORMAL LOW (ref 39.0–52.0)
Hemoglobin: 10.1 g/dL — ABNORMAL LOW (ref 13.0–17.0)
Immature Granulocytes: 2 %
Lymphocytes Relative: 12 %
Lymphs Abs: 1.6 10*3/uL (ref 0.7–4.0)
MCH: 29.2 pg (ref 26.0–34.0)
MCHC: 31.4 g/dL (ref 30.0–36.0)
MCV: 93.1 fL (ref 80.0–100.0)
Monocytes Absolute: 0.4 10*3/uL (ref 0.1–1.0)
Monocytes Relative: 3 %
Neutro Abs: 10.8 10*3/uL — ABNORMAL HIGH (ref 1.7–7.7)
Neutrophils Relative %: 78 %
Platelets: 357 10*3/uL (ref 150–400)
RBC: 3.46 MIL/uL — ABNORMAL LOW (ref 4.22–5.81)
RDW: 15.7 % — ABNORMAL HIGH (ref 11.5–15.5)
WBC: 13.6 10*3/uL — ABNORMAL HIGH (ref 4.0–10.5)
nRBC: 0 % (ref 0.0–0.2)

## 2019-06-20 LAB — GLUCOSE, CAPILLARY
Glucose-Capillary: 152 mg/dL — ABNORMAL HIGH (ref 70–99)
Glucose-Capillary: 155 mg/dL — ABNORMAL HIGH (ref 70–99)
Glucose-Capillary: 200 mg/dL — ABNORMAL HIGH (ref 70–99)
Glucose-Capillary: 206 mg/dL — ABNORMAL HIGH (ref 70–99)
Glucose-Capillary: 247 mg/dL — ABNORMAL HIGH (ref 70–99)
Glucose-Capillary: 90 mg/dL (ref 70–99)

## 2019-06-20 LAB — BASIC METABOLIC PANEL
Anion gap: 15 (ref 5–15)
BUN: 59 mg/dL — ABNORMAL HIGH (ref 8–23)
CO2: 30 mmol/L (ref 22–32)
Calcium: 9.4 mg/dL (ref 8.9–10.3)
Chloride: 109 mmol/L (ref 98–111)
Creatinine, Ser: 1.71 mg/dL — ABNORMAL HIGH (ref 0.61–1.24)
GFR calc Af Amer: 49 mL/min — ABNORMAL LOW (ref >60)
GFR calc non Af Amer: 42 mL/min — ABNORMAL LOW (ref >60)
Glucose, Bld: 167 mg/dL — ABNORMAL HIGH (ref 70–99)
Potassium: 3.5 mmol/L (ref 3.5–5.1)
Sodium: 154 mmol/L — ABNORMAL HIGH (ref 135–145)

## 2019-06-20 MED ORDER — WHITE PETROLATUM EX OINT
TOPICAL_OINTMENT | CUTANEOUS | Status: AC
  Filled 2019-06-20: qty 28.35

## 2019-06-20 MED ORDER — INSULIN ASPART 100 UNIT/ML ~~LOC~~ SOLN
6.0000 [IU] | SUBCUTANEOUS | Status: DC
  Administered 2019-06-20 – 2019-06-25 (×8): 6 [IU] via SUBCUTANEOUS

## 2019-06-20 MED ORDER — FREE WATER
200.0000 mL | Status: DC
  Administered 2019-06-20 – 2019-06-21 (×6): 200 mL

## 2019-06-20 MED ORDER — GERHARDT'S BUTT CREAM
TOPICAL_CREAM | Freq: Two times a day (BID) | CUTANEOUS | Status: DC
  Filled 2019-06-20 (×2): qty 1

## 2019-06-20 NOTE — Progress Notes (Addendum)
PULMONARY / CRITICAL CARE MEDICINE   NAME:  Scott Mcgee, MRN:  [id number], DOB:  March 04, 1961, LOS: 75 ADMISSION DATE:  06/12/2019, CONSULTATION DATE:  06/12/2019 REFERRING MD:  [facility] CHIEF COMPLAINT:  sob  BRIEF HISTORY:    19 yobm with  DM/HBP/prostate ca admit 2/8 with dysuria/low grade fever x 2 weeks PTA and high blood sugars/ trace Betahydroxybutyrate and low Na, rx with Insulin/ fluids admit to ICU on insulin drip/meropenem with increasing sob and abd distention > CT Abd s acute findings and pccm asked to see am 2/9.   HISTORY OF PRESENT ILLNESS   59 y.o. male with medical history significant of diabetes, hypertension, prostate cancer status post prostate resection, presents to the emergency room with a 2-week history of elevated blood sugars.  He reported that his sugars have been over 400.  He has been having intermittent fevers for the past few days.  He describes dysuria for the past 2 weeks.  No abdominal pain or flank pain.  He has had shortness of breath am of admit , but no cough.  He has been having hiccups intermittently for the past few weeks.  He has not had any vomiting or diarrhea.  No blurred vision.  He has had a distended abdomen, but wife reports that this is been present for several years now.  ED Course: He was evaluated emergency room where lab work showed that he was hyperglycemic, hyponatremic, elevated creatinine.  Urinalysis indicated possible infection.  CT of the abdomen pelvis performed showed questionable stranding around kidneys but otherwise no acute abnormalities.  He was started on insulin infusion as well as IV fluids and referred for admission.   SIGNIFICANT PAST MEDICAL HISTORY   AODM Prostate ca s/p prostatectomy  SIGNIFICANT EVENTS:  2/9: transferred from [facility]  STUDIES:   CT abd  2/8: Cholelithiasis without complicating factors. Mild perinephric stranding is noted on the left without obstructive change. Underlying UTI could not be totally  excluded. Mild bibasilar atelectasis. 2/9 echo: LVEF 60-65%, LVH 2/13 EEG:   CULTURES:  Covid 19 PCR   2/8 neg  Resp viral panel 2/8 neg  MRSA PCR  2/8  Neg  BC x 2  2/8 >>>ngtd UC 2/8 >>> ecoli resp cx 2/11: ngtd  ANTIBIOTICS:  Meropenem 2/8 - 2/10 Ctx 2/10->2/16  LINES/TUBES:  2/9 ETT  CONSULTANTS:  PCCM    Scheduled Meds: . chlorhexidine gluconate (MEDLINE KIT)  15 mL Mouth Rinse BID  . Chlorhexidine Gluconate Cloth  6 each Topical Daily  . free water  200 mL Per Tube Q6H  . heparin injection (subcutaneous)  5,000 Units Subcutaneous Q8H  . insulin aspart  0-15 Units Subcutaneous Q4H  . insulin aspart  3 Units Subcutaneous Q4H  . insulin glargine  15 Units Subcutaneous Daily  . ipratropium-albuterol  3 mL Nebulization Q6H  . mouth rinse  15 mL Mouth Rinse 10 times per day  . pantoprazole sodium  40 mg Per Tube Daily  . polyethylene glycol  17 g Per Tube Daily  . senna-docusate  1 tablet Per Tube BID   Continuous Infusions: . sodium chloride 10 mL/hr at 06/20/19 0700  . cefTRIAXone (ROCEPHIN)  IV Stopped (06/19/19 1816)  . dexmedetomidine (PRECEDEX) IV infusion 1.2 mcg/kg/hr (06/20/19 0700)  . feeding supplement (VITAL AF 1.2 CAL) 1,000 mL (06/20/19 0151)  . fentaNYL infusion INTRAVENOUS 100 mcg/hr (06/20/19 0700)   PRN Meds:.acetaminophen **OR** acetaminophen, albuterol, dextrose, fentaNYL, midazolam   SUBJECTIVE:  Patient appeared more alert and awake  this morning.  Attempted to decrease pressure on vent and patient became tachypnic with RR in the 40s-50s.    CONSTITUTIONAL: BP 119/86   Pulse 86   Temp (!) 97 F (36.1 C) (Axillary) Comment: added warm blanket  Resp (!) 22   Ht 5' 6"  (1.676 m)   Wt 75.6 kg   SpO2 100%   BMI 26.90 kg/m   I/O last 3 completed shifts: In: 4387.1 [I.V.:1437; NG/GT:2850; IV Piggyback:100.1] Out: 2785 [Urine:2745; Stool:40]    Intake/Output Summary (Last 24 hours) at 06/20/2019 0752 Last data filed at 06/20/2019  0700 Gross per 24 hour  Intake 3272.78 ml  Output 1485 ml  Net 1787.78 ml       Vent Mode: PSV;CPAP FiO2 (%):  [40 %] 40 % Set Rate:  [12 bmp] 12 bmp Vt Set:  [510 mL] 510 mL PEEP:  [5 cmH20] 5 cmH20 Pressure Support:  [5 cmH20-18 cmH20] 5 cmH20 Plateau Pressure:  [25 OXB35-32 cmH20] 25 cmH20  PHYSICAL EXAM: General: Chronically ill-appearing man, ventilated, but awake and communicative Neuro: Awake, more alert than yesterday HEENT: ET tube in place, MMM Cardiovascular: RRR Lungs: lungs CTAB Abdomen: Large protuberant abdomen, non-tender to palpation Musculoskeletal: No deformities or edema Skin: No rash; scars on bilateral lower legs  RESOLVED PROBLEM LIST  Anion gap metabolic acidosis Hyponatremia Hypotension resolved  ASSESSMENT AND PLAN   Acute hypoxemic resp failure: PRVC.  Resp culture from 2/11 showed no growth at 2 days.  Mild increase in WBCs over the last few days and CXR shows patchy bibasilar airspace opacities that look stable compared to previous.  Lungs CTAB on exam today.  Patient currently afebrile.  Will CTM closely for any sign of infection.  Attempted to decrease pressure on vent this morning and patient became tachypnic with RR in the 40s-50s. -Attempt to wean/extubate this afternoon -CXR 2/16 -Monitor vitals for fever -daily CBC  Acute on chronic renal failure: longstanding DM (? No longer on metformin? ) and UTI with possible sepsis vol depletion.  Continues to slowly improve, creatinine 1.71 on 2/16.  Received Lasix x2 on 2/13, x1 on 2/14.  Diuretic holiday on 2/15.  Now with a cumulative net of about 0 since admission.   -Hold diuretics today -Follow urine output, BMP -FW  992EQ q6 hours  Complicated E .Coli UTI w/ severe sepsis, no shock: No evidence ureteral obstruction on CT abdomen -Continue ceftriaxone, plan to finish 7 days total antibiotics (2/10 - 2/16)  Hypernatremia: Hydrochlorothiazide on hold.  Suspect my be a little fluid down  following diuresis. -Hold diuretics today  Diabetes: Appreciate recs from Diabetes Coordinator -increase Novolog to 6 units q6 hours -cont Lantus 15 units daily -Glucose checks q4 hours  Acute encephalopathy 2/13: ?seizure.  No h/o seizures and none seen on subsequent EEG.  No acute intracranial anomalies on head CT. -Follow mental status closely  HFpEF:  -Tight blood pressure control -Monitor fluid status closely  Normocytic anemia:  -Follow CBC  Abdominal distention:  -Bowel regimen as ordered  Best Practice / Goals of Care / Disposition.   DVT PROPHYLAXIS:  Hep sq  SUP: Protonix  NUTRITION: tf MOBILITY: bedrest GOALS OF CARE: full code FAMILY DISCUSSIONS: Last s/w wife on 2/15 at bedside DISPOSITION: ICU  LABS  Glucose Recent Labs  Lab 06/19/19 0832 06/19/19 1122 06/19/19 1635 06/19/19 1955 06/20/19 0023 06/20/19 0401  GLUCAP 197* 282* 110* 250* 247* 152*    BMET Recent Labs  Lab 06/18/19 0536 06/19/19 0445 06/20/19 6834  NA 146* 153* 154*  K 3.3* 3.5 3.5  CL 103 105 109  CO2 28 31 30   BUN 70* 62* 59*  CREATININE 2.37* 1.85* 1.71*  GLUCOSE 270* 195* 167*    Liver Enzymes Recent Labs  Lab 06/16/19 0349 06/17/19 0403  AST 60* 145*  ALT 34 66*  ALKPHOS 539* 998*  BILITOT 2.2* 3.1*  ALBUMIN 1.9* 2.0*    Electrolytes Recent Labs  Lab 06/14/19 0611 06/15/19 0850 06/16/19 0349 06/16/19 1139 06/17/19 0403 06/17/19 0403 06/18/19 0536 06/19/19 0445 06/20/19 0343  CALCIUM 8.0*   < > 8.8*   < > 9.1   < > 9.2 9.8 9.4  MG 2.1  --  1.8   < > 2.0  --  2.0 2.1  --   PHOS 5.3*  --  4.3  --  3.5  --   --   --   --    < > = values in this interval not displayed.    CBC Recent Labs  Lab 06/18/19 0536 06/19/19 0445 06/20/19 0343  WBC 11.1* 12.3* 13.6*  HGB 9.9* 10.3* 10.1*  HCT 29.8* 32.0* 32.2*  PLT 331 367 357    ABG Recent Labs  Lab 06/15/19 0301 06/15/19 0317 06/17/19 1315  PHART 7.248* 7.328* 7.394  PCO2ART 43.0 31.2* 46.1   PO2ART 25.0* 58.0* 80.2*    Coag's No results for input(s): APTT, INR in the last 168 hours.  Sepsis Markers Recent Labs  Lab 06/15/19 1010  LATICACIDVEN 1.2    Cardiac Enzymes No results for input(s): TROPONINI, PROBNP in the last 168 hours.    Critical care time: The patient is critically ill with multiple organ systems failure and requires high complexity decision making for assessment and support, frequent evaluation and titration of therapies, application of advanced monitoring technologies and extensive interpretation of multiple databases.  Critical care time 45 mins. This represents my time independent of the NPs time taking care of the pt. This is excluding procedures.    Mallie Darting, MS4

## 2019-06-21 ENCOUNTER — Inpatient Hospital Stay (HOSPITAL_COMMUNITY): Payer: Medicare Other

## 2019-06-21 LAB — CBC WITH DIFFERENTIAL/PLATELET
Abs Immature Granulocytes: 0.18 10*3/uL — ABNORMAL HIGH (ref 0.00–0.07)
Basophils Absolute: 0.1 10*3/uL (ref 0.0–0.1)
Basophils Relative: 0 %
Eosinophils Absolute: 0.5 10*3/uL (ref 0.0–0.5)
Eosinophils Relative: 3 %
HCT: 31.5 % — ABNORMAL LOW (ref 39.0–52.0)
Hemoglobin: 9.5 g/dL — ABNORMAL LOW (ref 13.0–17.0)
Immature Granulocytes: 1 %
Lymphocytes Relative: 12 %
Lymphs Abs: 1.8 10*3/uL (ref 0.7–4.0)
MCH: 29.1 pg (ref 26.0–34.0)
MCHC: 30.2 g/dL (ref 30.0–36.0)
MCV: 96.3 fL (ref 80.0–100.0)
Monocytes Absolute: 0.4 10*3/uL (ref 0.1–1.0)
Monocytes Relative: 3 %
Neutro Abs: 12.3 10*3/uL — ABNORMAL HIGH (ref 1.7–7.7)
Neutrophils Relative %: 81 %
Platelets: 354 10*3/uL (ref 150–400)
RBC: 3.27 MIL/uL — ABNORMAL LOW (ref 4.22–5.81)
RDW: 15.8 % — ABNORMAL HIGH (ref 11.5–15.5)
WBC: 15.3 10*3/uL — ABNORMAL HIGH (ref 4.0–10.5)
nRBC: 0 % (ref 0.0–0.2)

## 2019-06-21 LAB — COMPREHENSIVE METABOLIC PANEL
ALT: 113 U/L — ABNORMAL HIGH (ref 0–44)
AST: 94 U/L — ABNORMAL HIGH (ref 15–41)
Albumin: 2 g/dL — ABNORMAL LOW (ref 3.5–5.0)
Alkaline Phosphatase: 865 U/L — ABNORMAL HIGH (ref 38–126)
Anion gap: 13 (ref 5–15)
BUN: 53 mg/dL — ABNORMAL HIGH (ref 8–23)
CO2: 29 mmol/L (ref 22–32)
Calcium: 9.3 mg/dL (ref 8.9–10.3)
Chloride: 112 mmol/L — ABNORMAL HIGH (ref 98–111)
Creatinine, Ser: 1.51 mg/dL — ABNORMAL HIGH (ref 0.61–1.24)
GFR calc Af Amer: 57 mL/min — ABNORMAL LOW (ref >60)
GFR calc non Af Amer: 49 mL/min — ABNORMAL LOW (ref >60)
Glucose, Bld: 215 mg/dL — ABNORMAL HIGH (ref 70–99)
Potassium: 4.2 mmol/L (ref 3.5–5.1)
Sodium: 154 mmol/L — ABNORMAL HIGH (ref 135–145)
Total Bilirubin: 3.2 mg/dL — ABNORMAL HIGH (ref 0.3–1.2)
Total Protein: 6.5 g/dL (ref 6.5–8.1)

## 2019-06-21 LAB — GLUCOSE, CAPILLARY
Glucose-Capillary: 101 mg/dL — ABNORMAL HIGH (ref 70–99)
Glucose-Capillary: 104 mg/dL — ABNORMAL HIGH (ref 70–99)
Glucose-Capillary: 127 mg/dL — ABNORMAL HIGH (ref 70–99)
Glucose-Capillary: 158 mg/dL — ABNORMAL HIGH (ref 70–99)
Glucose-Capillary: 202 mg/dL — ABNORMAL HIGH (ref 70–99)
Glucose-Capillary: 94 mg/dL (ref 70–99)

## 2019-06-21 MED ORDER — LORAZEPAM 1 MG PO TABS: 0.0000 mg | ORAL_TABLET | Freq: Three times a day (TID) | ORAL | Status: DC

## 2019-06-21 MED ORDER — FREE WATER: 200.0000 mL | Status: DC

## 2019-06-21 MED ORDER — ADULT MULTIVITAMIN W/MINERALS CH
1.0000 | ORAL_TABLET | Freq: Every day | ORAL | Status: DC
  Administered 2019-06-21 – 2019-06-29 (×8): 1 via ORAL
  Filled 2019-06-21 (×9): qty 1

## 2019-06-21 MED ORDER — THIAMINE HCL 100 MG/ML IJ SOLN
100.0000 mg | Freq: Every day | INTRAMUSCULAR | Status: DC
  Administered 2019-06-25 – 2019-06-28 (×4): 100 mg via INTRAVENOUS
  Filled 2019-06-21 (×6): qty 2

## 2019-06-21 MED ORDER — ORAL CARE MOUTH RINSE
15.0000 mL | Freq: Two times a day (BID) | OROMUCOSAL | Status: DC
  Administered 2019-06-21 – 2019-06-29 (×15): 15 mL via OROMUCOSAL

## 2019-06-21 MED ORDER — THIAMINE HCL 100 MG PO TABS
100.0000 mg | ORAL_TABLET | Freq: Every day | ORAL | Status: DC
  Administered 2019-06-21 – 2019-06-29 (×4): 100 mg via ORAL
  Filled 2019-06-21 (×6): qty 1

## 2019-06-21 MED ORDER — POLYETHYLENE GLYCOL 3350 17 G PO PACK
17.0000 g | PACK | Freq: Every day | ORAL | Status: DC
  Administered 2019-06-23 – 2019-06-29 (×7): 17 g via ORAL
  Filled 2019-06-21 (×8): qty 1

## 2019-06-21 MED ORDER — LORAZEPAM 1 MG PO TABS
0.0000 mg | ORAL_TABLET | ORAL | Status: DC
  Administered 2019-06-21: 1 mg via ORAL
  Administered 2019-06-21: 2 mg via ORAL
  Administered 2019-06-22 (×2): 3 mg via ORAL
  Filled 2019-06-21 (×2): qty 3
  Filled 2019-06-21: qty 2
  Filled 2019-06-21: qty 1

## 2019-06-21 MED ORDER — LORAZEPAM 2 MG/ML IJ SOLN
1.0000 mg | INTRAMUSCULAR | Status: DC | PRN
  Administered 2019-06-21 (×2): 2 mg via INTRAVENOUS
  Administered 2019-06-22 (×4): 3 mg via INTRAVENOUS
  Administered 2019-06-22: 2 mg via INTRAVENOUS
  Administered 2019-06-22: 3 mg via INTRAVENOUS
  Filled 2019-06-21 (×2): qty 2
  Filled 2019-06-21 (×2): qty 1
  Filled 2019-06-21 (×4): qty 2
  Filled 2019-06-21: qty 1
  Filled 2019-06-21: qty 2

## 2019-06-21 MED ORDER — FOLIC ACID 1 MG PO TABS
1.0000 mg | ORAL_TABLET | Freq: Every day | ORAL | Status: DC
  Administered 2019-06-21 – 2019-06-29 (×8): 1 mg via ORAL
  Filled 2019-06-21 (×8): qty 1

## 2019-06-21 MED ORDER — IPRATROPIUM-ALBUTEROL 0.5-2.5 (3) MG/3ML IN SOLN
3.0000 mL | Freq: Two times a day (BID) | RESPIRATORY_TRACT | Status: DC
  Administered 2019-06-22 – 2019-06-24 (×3): 3 mL via RESPIRATORY_TRACT
  Filled 2019-06-21 (×4): qty 3

## 2019-06-21 MED ORDER — LORAZEPAM 1 MG PO TABS: 1.0000 mg | ORAL_TABLET | ORAL | Status: DC | PRN

## 2019-06-21 MED ORDER — SENNOSIDES-DOCUSATE SODIUM 8.6-50 MG PO TABS
1.0000 | ORAL_TABLET | Freq: Two times a day (BID) | ORAL | Status: DC
  Administered 2019-06-23 – 2019-06-29 (×13): 1 via ORAL
  Filled 2019-06-21 (×15): qty 1

## 2019-06-21 MED ORDER — FREE WATER: 400.0000 mL | Status: DC

## 2019-06-21 NOTE — Progress Notes (Signed)
PULMONARY / CRITICAL CARE MEDICINE   NAME:  Scott Mcgee, MRN:  [id number], DOB:  Mar 10, 1961, LOS: 9 ADMISSION DATE:  06/12/2019, CONSULTATION DATE:  06/12/2019 REFERRING MD:  [facility] CHIEF COMPLAINT:  sob  BRIEF HISTORY:    45 yobm with  DM/HBP/prostate ca admit 2/8 with dysuria/low grade fever x 2 weeks PTA and high blood sugars/ trace Betahydroxybutyrate and low Na, rx with Insulin/ fluids admit to ICU on insulin drip/meropenem with increasing sob and abd distention > CT Abd s acute findings and pccm asked to see am 2/9.  Urine culture + for E. Coli UTI, and patient completed 7 day course of Ceftriaxone (2/10 - 2/16).  HISTORY OF PRESENT ILLNESS   59 y.o. male with medical history significant of diabetes, hypertension, prostate cancer status post prostate resection, presents to the emergency room with a 2-week history of elevated blood sugars.  He reported that his sugars have been over 400.  He has been having intermittent fevers for the past few days.  He describes dysuria for the past 2 weeks.  No abdominal pain or flank pain.  He has had shortness of breath am of admit , but no cough.  He has been having hiccups intermittently for the past few weeks.  He has not had any vomiting or diarrhea.  No blurred vision.  He has had a distended abdomen, but wife reports that this is been present for several years now.  ED Course: He was evaluated emergency room where lab work showed that he was hyperglycemic, hyponatremic, elevated creatinine.  Urinalysis indicated possible infection.  CT of the abdomen pelvis performed showed questionable stranding around kidneys but otherwise no acute abnormalities.  He was started on insulin infusion as well as IV fluids and referred for admission.   SIGNIFICANT PAST MEDICAL HISTORY   AODM Prostate ca s/p prostatectomy  SIGNIFICANT EVENTS:  2/9: transferred from [facility] 2/9: intubated  STUDIES:   CT abd  2/8: Cholelithiasis without complicating factors. Mild  perinephric stranding is noted on the left without obstructive change. Underlying UTI could not be totally excluded. Mild bibasilar atelectasis. 2/9 echo: LVEF 60-65%, LVH 2/13 EEG:   CULTURES:  Covid 19 PCR   2/8 neg  Resp viral panel 2/8 neg  MRSA PCR  2/8  Neg  BC x 2  2/8 >>>ngtd UC 2/8 >>> ecoli resp cx 2/11: ngtd  ANTIBIOTICS:  Meropenem 2/8 - 2/10 Ctx 2/10->2/16  LINES/TUBES:  2/9 ETT  CONSULTANTS:  PCCM    Scheduled Meds: . chlorhexidine gluconate (MEDLINE KIT)  15 mL Mouth Rinse BID  . Chlorhexidine Gluconate Cloth  6 each Topical Daily  . free water  200 mL Per Tube Q4H  . Gerhardt's butt cream   Topical BID  . heparin injection (subcutaneous)  5,000 Units Subcutaneous Q8H  . insulin aspart  0-15 Units Subcutaneous Q4H  . insulin aspart  6 Units Subcutaneous Q4H  . insulin glargine  15 Units Subcutaneous Daily  . ipratropium-albuterol  3 mL Nebulization Q6H  . mouth rinse  15 mL Mouth Rinse 10 times per day  . pantoprazole sodium  40 mg Per Tube Daily  . polyethylene glycol  17 g Per Tube Daily  . senna-docusate  1 tablet Per Tube BID   Continuous Infusions: . sodium chloride 10 mL/hr at 06/21/19 0700  . dexmedetomidine (PRECEDEX) IV infusion 1 mcg/kg/hr (06/21/19 0700)  . feeding supplement (VITAL AF 1.2 CAL) 1,000 mL (06/20/19 2054)  . fentaNYL infusion INTRAVENOUS 100 mcg/hr (06/21/19 0700)  PRN Meds:.acetaminophen **OR** acetaminophen, albuterol, dextrose, fentaNYL, midazolam   SUBJECTIVE:  NAEON.  Per RT some thicker secretions o/n.  Patient still having trouble weaning off the vent.  CONSTITUTIONAL: BP 115/68   Pulse 65   Temp 98 F (36.7 C) (Oral)   Resp 15   Ht _0  (1.676 m)   Wt 77 kg   SpO2 100%   BMI 27.40 kg/m   I/O last 3 completed shifts: In: 5324.7 [I.V.:1574.7; NG/GT:3650; IV Piggyback:100] Out: 975 [Urine:935; Stool:40]    Intake/Output Summary (Last 24 hours) at 06/21/2019 0753 Last data filed at 06/21/2019  0700 Gross per 24 hour  Intake 3709.54 ml  Output 235 ml  Net 3474.54 ml       Vent Mode: PRVC FiO2 (%):  [40 %] 40 % Set Rate:  [12 bmp] 12 bmp Vt Set:  [510 mL] 510 mL PEEP:  [5 cmH20] 5 cmH20 Plateau Pressure:  [16 cmH20-36 cmH20] 36 cmH20  PHYSICAL EXAM: General: Chronically ill-appearing man, ventilated, but awake and communicating Neuro: Awake, less alert than yesterday HEENT: ET tube in place Cardiovascular: RRR Lungs: lungs decreased lung sounds on the left side Abdomen: Large protuberant abdomen, non-tender to palpation Musculoskeletal: No deformities or edema Skin: No rash; scars on bilateral lower legs  RESOLVED PROBLEM LIST  Anion gap metabolic acidosis Hyponatremia Hypotension resolved  ASSESSMENT AND PLAN   Acute hypoxemic resp failure: PRVC.  Resp culture from 2/11 showed no growth at 2 days.  Mild increase in WBCs over the last few days, but  CXR relatively stable.  Lungs with decreased breath sounds on the left side today.  Patient currently afebrile.  Will CTM closely for any sign of infection and repeat resp culture today given difficulty weaning from vent.   -repeat resp cx -daily CXR -daily CBC -Monitor vitals for fever -Attempt to wean/extubate this afternoon  Acute on chronic renal failure: longstanding DM (? No longer on metformin? ) and UTI with possible sepsis vol depletion.  Continues to improve.  Received Lasix x2 on 2/13, x1 on 2/14.  Diuretic holiday on 2/15, 2/16.  Now with a cumulative net of about +3.3L since admission after increasing FW to 244m q4 hours yesterday (2/17).   -Follow urine output, BMP -FW  2099mq6 hours -HCTZ today if creatinine still improving -Daily BMP  Complicated E .Coli UTI w/ severe sepsis, no shock: No evidence ureteral obstruction on CT abdomen.  Finished 7 day course of ceftriaxone (2/10 - 2/16).  Hypernatremia: Hydrochlorothiazide on hold.  Suspect my be a little fluid down following diuresis. -Treatment as  above  Diabetes: Appreciate recs from Diabetes Coordinator -Novolog 6 units q6 hours -Lantus 15 units daily -Glucose checks q4 hours  Acute encephalopathy 2/13: ?seizure.  No h/o seizures and none seen on subsequent EEG.  No acute intracranial anomalies on head CT. -Follow mental status closely  HFpEF:  -Tight blood pressure control -Monitor fluid status closely  Normocytic anemia:  -Follow CBC  Abdominal distention:  -Bowel regimen as ordered  Best Practice / Goals of Care / Disposition.   DVT PROPHYLAXIS:  Hep sq  SUP: Protonix  NUTRITION: tf MOBILITY: bedrest GOALS OF CARE: full code FAMILY DISCUSSIONS: Attempted phone call to wife x2 this am, will try again later today DISPOSITION: ICU  LABS  Glucose Recent Labs  Lab 06/20/19 1136 06/20/19 1543 06/20/19 1952 06/20/19 2358 06/21/19 0401 06/21/19 0732  GLUCAP 206* 90 155* 202* 101* 127*    BMET Recent Labs  Lab 06/18/19  6270 06/19/19 0445 06/20/19 0343  NA 146* 153* 154*  K 3.3* 3.5 3.5  CL 103 105 109  CO2 _0 BUN 70* 62* 59*  CREATININE 2.37* 1.85* 1.71*  GLUCOSE 270* 195* 167*    Liver Enzymes Recent Labs  Lab 06/16/19 0349 06/17/19 0403  AST 60* 145*  ALT 34 66*  ALKPHOS 539* 998*  BILITOT 2.2* 3.1*  ALBUMIN 1.9* 2.0*    Electrolytes Recent Labs  Lab 06/16/19 0349 06/16/19 1139 06/17/19 0403 06/17/19 0403 06/18/19 0536 06/19/19 0445 06/20/19 0343  CALCIUM 8.8*   < > 9.1   < > 9.2 9.8 9.4  MG 1.8   < > 2.0  --  2.0 2.1  --   PHOS 4.3  --  3.5  --   --   --   --    < > = values in this interval not displayed.    CBC Recent Labs  Lab 06/18/19 0536 06/19/19 0445 06/20/19 0343  WBC 11.1* 12.3* 13.6*  HGB 9.9* 10.3* 10.1*  HCT 29.8* 32.0* 32.2*  PLT 331 367 357    ABG Recent Labs  Lab 06/15/19 0301 06/15/19 0317 06/17/19 1315  PHART 7.248* 7.328* 7.394  PCO2ART 43.0 31.2* 46.1  PO2ART 25.0* 58.0* 80.2*    Coag's No results for input(s): APTT, INR in the  last 168 hours.  Sepsis Markers Recent Labs  Lab 06/15/19 1010  LATICACIDVEN 1.2    Cardiac Enzymes No results for input(s): TROPONINI, PROBNP in the last 168 hours.    Critical care time: The patient is critically ill with multiple organ systems failure and requires high complexity decision making for assessment and support, frequent evaluation and titration of therapies, application of advanced monitoring technologies and extensive interpretation of multiple databases.  Critical care time 45 mins. This represents my time independent of the NPs time taking care of the pt. This is excluding procedures.    Mallie Darting, MS4

## 2019-06-21 NOTE — Progress Notes (Addendum)
Looks great off vent, asking for lots of water, start clear liquid diet. LFTs showing mild transaminitis and alk phos elevation, RUQ [place] ordered per student Dr. Len Childs.  Also shaking a lot, admits he drinks a fifth of liquor a day, PRN PO ativan and CIWA ordered.  Okay for transfer to stepdown, appreciate TRH taking over care.

## 2019-06-21 NOTE — Progress Notes (Signed)
Seen and examined with medical student, agree with assessment and plan as written.   S: Seen in f/u for sepsis complicated by respiratory failure thought related to fluid overload and acute kidney injury.  Doing well this AM on PS. Wants to be extubated.  Awaiting AM labs.   O: Blood pressure 112/68, pulse 77, temperature 98 F (36.7 C), temperature source Oral, resp. rate (!) 22, height 5\' 6"  (1.676 m), weight 77 kg, SpO2 100 %.  GEN: 59 year old man in NAD on vent HEENT: 7.5 ett in place, some small purulent secretions CV: RRR, ext warm PULM: Diminished bases, otherwise clear GI: markedly distended but + bs and stool in rectal tube.  Looking at prior CT and per discussion with family this is baseline. EXT: No edema NEURO: Moves all 4 ext to command PSYCH: RASS 0 SKIN: No rashes   A:  # Acute hypoxemic respiratory failure- mostly related to volume overload plus ATN leading to acidemia, inability to keep up with metabolic demands.  Improved # ATN- improving # E coli complicated UTI resolved, 7 days ceftriaxone given # Metabolic encephalopathy resolved # Mild leukocytosis, secretions, questionable new infiltrate on CXR  P:  - Extubate - Bedside swallow screen - f/u AM labs - Check sputum culture, low threshold for HCAP coverage - Wife updated over phone  The patient is critically ill with multiple organ systems failure and requires high complexity decision making for assessment and support, frequent evaluation and titration of therapies, application of advanced monitoring technologies and extensive interpretation of multiple databases. Critical Care Time devoted to patient care services described in this note independent of APP/resident  time is 32 minutes.   06/21/2019 Erskine Emery MD

## 2019-06-21 NOTE — Procedures (Signed)
Extubation Procedure Note  Patient Details:   Name: Scott Mcgee DOB: [birth year] MRN: [id number]   Airway Documentation:    Vent end date: 06/21/19 Vent end time: 0948   Evaluation  O2 sats: stable throughout Complications: No apparent complications Patient did tolerate procedure well. Bilateral Breath Sounds: Clear, Diminished   Yes   Pt was extubated per MD order at 0948 to 4L [place] with saturations of 94%. Pt was suctioned and a cuff leak was heard prior to extubation. Pt able to speak afterwards clearly and no stridor heard at this time. RT will continue to monitor.   Ritu Gagliardo A Blakeley Margraf 06/21/2019, 9:52 AM

## 2019-06-22 ENCOUNTER — Inpatient Hospital Stay (HOSPITAL_COMMUNITY): Payer: Medicare Other

## 2019-06-22 DIAGNOSIS — E870 Hyperosmolality and hypernatremia: Secondary | ICD-10-CM

## 2019-06-22 DIAGNOSIS — R7989 Other specified abnormal findings of blood chemistry: Secondary | ICD-10-CM

## 2019-06-22 LAB — COMPREHENSIVE METABOLIC PANEL
ALT: 99 U/L — ABNORMAL HIGH (ref 0–44)
AST: 89 U/L — ABNORMAL HIGH (ref 15–41)
Albumin: 2.5 g/dL — ABNORMAL LOW (ref 3.5–5.0)
Alkaline Phosphatase: 729 U/L — ABNORMAL HIGH (ref 38–126)
Anion gap: 14 (ref 5–15)
BUN: 53 mg/dL — ABNORMAL HIGH (ref 8–23)
CO2: 24 mmol/L (ref 22–32)
Calcium: 9.7 mg/dL (ref 8.9–10.3)
Chloride: 112 mmol/L — ABNORMAL HIGH (ref 98–111)
Creatinine, Ser: 2.02 mg/dL — ABNORMAL HIGH (ref 0.61–1.24)
GFR calc Af Amer: 40 mL/min — ABNORMAL LOW (ref >60)
GFR calc non Af Amer: 35 mL/min — ABNORMAL LOW (ref >60)
Glucose, Bld: 104 mg/dL — ABNORMAL HIGH (ref 70–99)
Potassium: 4.6 mmol/L (ref 3.5–5.1)
Sodium: 150 mmol/L — ABNORMAL HIGH (ref 135–145)
Total Bilirubin: 4.3 mg/dL — ABNORMAL HIGH (ref 0.3–1.2)
Total Protein: 7.1 g/dL (ref 6.5–8.1)

## 2019-06-22 LAB — BASIC METABOLIC PANEL
Anion gap: 13 (ref 5–15)
BUN: 52 mg/dL — ABNORMAL HIGH (ref 8–23)
CO2: 26 mmol/L (ref 22–32)
Calcium: 9.9 mg/dL (ref 8.9–10.3)
Chloride: 109 mmol/L (ref 98–111)
Creatinine, Ser: 1.86 mg/dL — ABNORMAL HIGH (ref 0.61–1.24)
GFR calc Af Amer: 44 mL/min — ABNORMAL LOW (ref >60)
GFR calc non Af Amer: 38 mL/min — ABNORMAL LOW (ref >60)
Glucose, Bld: 102 mg/dL — ABNORMAL HIGH (ref 70–99)
Potassium: 4.1 mmol/L (ref 3.5–5.1)
Sodium: 148 mmol/L — ABNORMAL HIGH (ref 135–145)

## 2019-06-22 LAB — CBC WITH DIFFERENTIAL/PLATELET
Abs Immature Granulocytes: 0.14 10*3/uL — ABNORMAL HIGH (ref 0.00–0.07)
Basophils Absolute: 0.1 10*3/uL (ref 0.0–0.1)
Basophils Relative: 1 %
Eosinophils Absolute: 0.5 10*3/uL (ref 0.0–0.5)
Eosinophils Relative: 3 %
HCT: 30.6 % — ABNORMAL LOW (ref 39.0–52.0)
Hemoglobin: 9.4 g/dL — ABNORMAL LOW (ref 13.0–17.0)
Immature Granulocytes: 1 %
Lymphocytes Relative: 14 %
Lymphs Abs: 2.3 10*3/uL (ref 0.7–4.0)
MCH: 28.7 pg (ref 26.0–34.0)
MCHC: 30.7 g/dL (ref 30.0–36.0)
MCV: 93.3 fL (ref 80.0–100.0)
Monocytes Absolute: 0.6 10*3/uL (ref 0.1–1.0)
Monocytes Relative: 4 %
Neutro Abs: 12.4 10*3/uL — ABNORMAL HIGH (ref 1.7–7.7)
Neutrophils Relative %: 77 %
Platelets: 392 10*3/uL (ref 150–400)
RBC: 3.28 MIL/uL — ABNORMAL LOW (ref 4.22–5.81)
RDW: 15.8 % — ABNORMAL HIGH (ref 11.5–15.5)
WBC: 16 10*3/uL — ABNORMAL HIGH (ref 4.0–10.5)
nRBC: 0 % (ref 0.0–0.2)

## 2019-06-22 LAB — GLUCOSE, CAPILLARY
Glucose-Capillary: 105 mg/dL — ABNORMAL HIGH (ref 70–99)
Glucose-Capillary: 123 mg/dL — ABNORMAL HIGH (ref 70–99)
Glucose-Capillary: 61 mg/dL — ABNORMAL LOW (ref 70–99)
Glucose-Capillary: 71 mg/dL (ref 70–99)
Glucose-Capillary: 92 mg/dL (ref 70–99)
Glucose-Capillary: 93 mg/dL (ref 70–99)

## 2019-06-22 LAB — IRON AND TIBC
Iron: 88 ug/dL (ref 45–182)
Saturation Ratios: 35 % (ref 17.9–39.5)
TIBC: 252 ug/dL (ref 250–450)
UIBC: 164 ug/dL

## 2019-06-22 LAB — FERRITIN: Ferritin: 918 ng/mL — ABNORMAL HIGH (ref 24–336)

## 2019-06-22 LAB — PROCALCITONIN: Procalcitonin: 1.9 ng/mL

## 2019-06-22 LAB — AMMONIA: Ammonia: 53 umol/L — ABNORMAL HIGH (ref 9–35)

## 2019-06-22 MED ORDER — LORAZEPAM 1 MG PO TABS: 1.0000 mg | ORAL_TABLET | ORAL | Status: DC | PRN

## 2019-06-22 MED ORDER — LACTULOSE 10 GM/15ML PO SOLN
10.0000 g | Freq: Every day | ORAL | Status: DC
  Administered 2019-06-22 – 2019-06-25 (×4): 10 g via ORAL
  Filled 2019-06-22 (×4): qty 15

## 2019-06-22 MED ORDER — DEXTROSE 5 % IV SOLN: INTRAVENOUS | Status: AC

## 2019-06-22 MED ORDER — LORAZEPAM 2 MG/ML IJ SOLN
1.0000 mg | INTRAMUSCULAR | Status: DC | PRN
  Administered 2019-06-22 (×2): 2 mg via INTRAVENOUS
  Administered 2019-06-23: 3 mg via INTRAVENOUS
  Filled 2019-06-22 (×2): qty 1
  Filled 2019-06-22 (×2): qty 2
  Filled 2019-06-22: qty 1

## 2019-06-22 MED ORDER — DEXTROSE 5 % IV SOLN: INTRAVENOUS | Status: DC

## 2019-06-22 MED ORDER — QUETIAPINE FUMARATE 50 MG PO TABS
50.0000 mg | ORAL_TABLET | Freq: Every day | ORAL | Status: DC
  Administered 2019-06-23 – 2019-06-24 (×2): 50 mg via ORAL
  Filled 2019-06-22 (×2): qty 1

## 2019-06-22 MED ORDER — LORAZEPAM 2 MG/ML IJ SOLN
4.0000 mg | Freq: Once | INTRAMUSCULAR | Status: AC
  Administered 2019-06-23: 4 mg via INTRAVENOUS
  Filled 2019-06-22: qty 2

## 2019-06-22 NOTE — Progress Notes (Signed)
PT Cancellation Note  Patient Details Name: WESLEE FOGG MRN: [id number] DOB: 10/23/1960   Cancelled Treatment:    Reason Eval/Treat Not Completed: Medical issues which prohibited therapy; patient with elevated HR, tremors and confusion.  Will attempt another day.    Reginia Naas 06/22/2019, 11:03 AM  Magda Kiel, [place] [phone] 06/22/2019

## 2019-06-22 NOTE — Progress Notes (Signed)
Called to bedside as second set of eyes for pt who is having withdrawals. Pt is pleasant but confused and hallucinating, alert to self and year only, pt has visible all over tremors, CIWA is 17. T 99.1, BP 118/89, HR 104, RR 30, SpO2 91% on 2L [place]. Encouraged RN to continue to give Ativan per CIWA protocol and continue to monitor. Please call RRT if further assistance is needed.

## 2019-06-22 NOTE — Significant Event (Addendum)
Rapid Response Event Note  Overview: Called as a second set of eyes for pt with AMS.  Time Called: 2221 Arrival Time: 2230 Event Type: Neurologic  Initial Focused Assessment: Pt laying in bed with eyes opened, mumbling. Pt answers questions with mumbles. Will nod appropriately to some questions, follow some simple commands, and move all extremities. Skin warm and diaphoretic, tremors in arms present and pt making chewing motion with mouth.  Pt will close eyes at times but will open them right back up-seems to be sleepy but fighting it. HR-100s(ST), RR-24, SpO2-93% on 2L [place], CIWA-12. Pt was given 2mg  ativan IV at 2043 for CIWA-12>this did not change CIWA score.  Interventions: No RRT interventions at this time. NPO status while altered.  Plan of Care (if not transferred): Per RN, pt mental status unchanged from last night. Per MD note, they are aware of mental status and think withdrawing is unlikely at this point in his hospitalization. Ativan frequency decreased on day shift d/t this. Pt was extubated 2/17 and was on 1.4mcg precedex and 111mcg  fent at that time. Could he be withdrawing from these?  Pt ammonia level 53-pt is on lactulose po (received at 1656 today). Na is also high-pt receiving D5W continuously. Pt is very confused and altered, but not aggressive. ???delirium??? Lights off at night to encourage sleep(bed alarm on). Decreased stimulation to encourage rest. NPO right now d/t AMS. Continue to monitor pt. Call RRT if further assistance needed.   Update: 2350-Spoke with TRH NP 4mg  ativan IV ordered x 1. Pt T-100.2 Ax-rectal tylenol already ordered. Spoke with patient's RN about this.   Event Summary:   at      at          [facility], Carren Rang

## 2019-06-22 NOTE — Progress Notes (Signed)
OT Cancellation Note  Patient Details Name: Scott Mcgee MRN: [id number] DOB: October 18, 1960   Cancelled Treatment:    Reason Eval/Treat Not Completed: Patient not medically ready(Having tremors; elevated HR; on CIWA protocal)  [place], OT/L   Acute OT Clinical Specialist Acute Rehabilitation Services Pager [phone] Office [phone]  06/22/2019, 11:33 AM

## 2019-06-22 NOTE — Progress Notes (Signed)
Patient more alert this evening. Alert to self, and place, disoriented to time and situation. Reinforced time and situation to patient. Responding verbally to RN more appropriately and clearly than during AM assessment.

## 2019-06-22 NOTE — Progress Notes (Signed)
Spoke with Dr. Neysa Bonito regarding patient status. MD to come see patient.

## 2019-06-22 NOTE — Progress Notes (Addendum)
Patient brought to [place] for possible paracentesis.   Patient has increased abdominal girth, however Limited US Abdomen today shows no fluid.  This is consistent with recent CT imaging from 06/12/19 and RUQ [place] from this AM which also show no fluid.  CT Abdomen Pelvis reviewed with Dr. Kathlene Cote who notes increased centripetal, intraperitoneal fat which may be contributing to his appearence of abdominal distention.    Brynda Greathouse, MS RD PA-C 4:24 PM

## 2019-06-22 NOTE — Progress Notes (Signed)
PROGRESS NOTE    Scott Mcgee    Code Status: Full Code  WVP:[id number] DOB: 1961-03-08 DOA: 06/12/2019 LOS: 10 days  PCP: [facility]:  Chief Complaint  Patient presents with  . [facility] Summary  This is a 59 year old male with past medical history of diabetes, hypertension, prostate cancer who was admitted on 2/8 with dysuria, low-grade fevers for several days as well as dysuria x2 weeks.  To have hyperglycemia, hyponatremia and elevated creatinine ED UA positive for UTI.  CT abdomen/pelvis with stranding around kidneys without other abnormalities.  Started on insulin drip and IV fluids.  PCCM consulted on 2/9 for acute hypoxemic respiratory failure and in respiratory distress and patient was subsequently intubated.  Meropenem changed to ceftriaxone on 2/10.  Change in mental status on 2/13 noted by PCCM with bradycardia and EEG was ordered and patient was further sedated.  EEG showed profound diffuse encephalopathy without specific etiology and no seizures or epileptiform discharges.  Patient was successfully extubated on 2/17.  Patient transferred to progressive floor and TRH resumed care 2/18.  2/18: Noted to have CIWA 17 early a.m. with frequent Ativan administration per CIWA protocol. Diaphoresis, Rigors noted with abdominal distention.  Ammonia level minimally elevated.  Started on lactulose.  Also started on D5W for hypernatremia and free water deficit.  Foley catheter placed due to urinary retention.  Repeat blood cultures ordered and US guided paracentesis ordered.   A & P   Active Problems:   Type 2 diabetes mellitus with hyperosmolar hyperglycemic state (HHS) ([place])   Hyponatremia   HTN (hypertension)   Acute renal failure (HCC)   Abdominal distention   Alcohol use   Sepsis ([place])   Acute pyelonephritis   Acute respiratory failure ([place])   1. Severe sepsis secondary to multidrug resistant E. coli UTI, possible aspiration  pneumonia development a. Completed 7-day course of ceftriaxone as well as 2 days of meropenem b. Currently afebrile but worsening leukocytosis over the past several days, now WBC 16. Tachycardia, rigors, diaphoresis, renal function worsened today, still requiring O2.  BP stable c. MRSA nares negative d. CXR yesterday with slight worsening patchy opacity in right lung base e. Repeat blood cultures f. Discussed with PCCM patient's declining clinical status.  PCCM is to reevaluate today.  Appreciate recommendations on possible empiric antibiotics 2. Hypernatremia a. 3.3 L free water deficit b. Off HCTZ c. Start D5W infusion at slow rate given patient's volume overload status 3. 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 Initially improved, however now worsening overnight b. will start D5W as above, if no improvement will consult nephrology 4. Alcohol abuse a. Has been requiring high amounts of Ativan throughout the night given his rigors and sweats b. Last drink was over 10 days ago prior to admission, he is unlikely withdrawing at this point c. Decrease frequency of Ativan and taper off to prevent withdrawal seizure 5. Abdominal distention, normal body habitus vs. ascites a. Alk phos 865->729 b. AST/ALT improved c. RUQ ultrasound with hepatic steatosis, suspected cirrhosis and without cholelithiasis d. T bili elevated 3.2-> 4.3 with mild scleral icterus e. Ultrasound-guided paracentesis ordered today f. Continue bowel regimen 6. Concern for mild hepatic encephalopathy a. Elevated LFTs, elevated ammonia (53), Alcohol use and suspected cirrhosis on ultrasound b. Will start low dose lactulose and follow up in AM c.  EEG unremarkable for seizure 7. Acute hypoxemic respiratory failure, concern for volume overload as well as possible aspiration pneumonia a. Appreciate PCCM  recommendations b. Daily weights and strict I's and O's with Foley catheter 8. Mild urinary retention, unknown etiology a. Foley catheter today b. Evaluate for void trial in a.m 9. HFpEF, possible exacerbation a. I/os and Daily weights 10. Normocytic anemia a. Check iron studies b. Suspect AoCD/AoCKD 11. Diabetes a. Continue current regimen   DVT prophylaxis: Subcu heparin Family Communication: Patient's wife has been updated  Disposition Plan:   Patient came from:   Home                                                                                          Anticipated d/c place: TBD  Barriers to d/c: Medical stability  Pressure injury documentation    None  Consultants  PCCM  Procedures  ETT 2/9-2/17 CT abd  2/8: Cholelithiasis without complicating factors. Mild perinephric stranding is noted on the left without obstructive change. Underlying UTI could not be totally excluded. Mild bibasilar atelectasis. 2/9 echo: LVEF 60-65%, LVH 2/13 EEG:   Antibiotics   Anti-infectives (From admission, onward)   Start     Dose/Rate Route Frequency Ordered Stop   06/14/19 1830  cefTRIAXone (ROCEPHIN) 2 g in sodium chloride 0.9 % 100 mL IVPB     2 g 200 mL/hr over 30 Minutes Intravenous Every 24 hours 06/14/19 1020 06/20/19 1803   06/12/19 1830  meropenem (MERREM) 1 g in sodium chloride 0.9 % 100 mL IVPB  Status:  Discontinued     1 g 200 mL/hr over 30 Minutes Intravenous Every 12 hours 06/12/19 1818 06/14/19 1020   06/12/19 1745  cefTRIAXone (ROCEPHIN) 2 g in sodium chloride 0.9 % 100 mL IVPB  Status:  Discontinued     2 g 200 mL/hr over 30 Minutes Intravenous Every 24 hours 06/12/19 1743 06/12/19 1814        Subjective   Paged by nursing regarding patient's high Ativan requirements throughout the night as well as tremors.  Patient was evaluated at bedside and he was being rigors and difficulty speaking due to such shaking.  Also diaphoretic.  He denied any complaints  but was a poor historian given his medical issues unable to get a good history  Objective   Vitals:   06/22/19 0751 06/22/19 0830 06/22/19 0900 06/22/19 0908  BP: (!) 108/55 111/63 111/63   Pulse:  87    Resp:  (!) 37    Temp: 97.9 F (36.6 C)   98.3 F (36.8 C)  TempSrc: Axillary   Axillary  SpO2:  92%  93%  Weight:      Height:        Intake/Output Summary (Last 24 hours) at 06/22/2019 1627 Last data filed at 06/21/2019 1700 Gross per 24 hour  Intake 240 ml  Output --  Net 240 ml   Filed Weights   06/19/19 0500 06/20/19 0404 06/21/19 0456  Weight: 74.7 kg 75.6 kg 77 kg    Examination:  Physical Exam Vitals and nursing note reviewed.  Constitutional:      Appearance:  He is ill-appearing and diaphoretic.     Comments: Tremulous  Eyes:     General: Scleral icterus present.  Cardiovascular:     Rate and Rhythm: Regular rhythm. Tachycardia present.  Pulmonary:     Effort: Pulmonary effort is normal.     Breath sounds: Normal breath sounds.     Comments: Nasal cannula Poor inspiratory effort Musculoskeletal:     Right lower leg: No edema.     Left lower leg: No edema.  Neurological:     Comments: Unable to evaluate asterixis  Psychiatric:        Mood and Affect: Mood normal.     Data Reviewed: I have personally reviewed following labs and imaging studies  CBC: Recent Labs  Lab 06/18/19 0536 06/19/19 0445 06/20/19 0343 06/21/19 0853 06/22/19 0321  WBC 11.1* 12.3* 13.6* 15.3* 16.0*  NEUTROABS  --   --  10.8* 12.3* 12.4*  HGB 9.9* 10.3* 10.1* 9.5* 9.4*  HCT 29.8* 32.0* 32.2* 31.5* 30.6*  MCV 88.4 90.4 93.1 96.3 93.3  PLT 331 367 357 354 350   Basic Metabolic Panel: Recent Labs  Lab 06/16/19 0349 06/16/19 0349 06/16/19 1139 06/16/19 1139 06/17/19 0403 06/17/19 0403 06/18/19 0536 06/18/19 0536 06/19/19 0445 06/20/19 0343 06/21/19 0853 06/22/19 0321 06/22/19 1046  NA 139   < > 137   < > 144   < > 146*   < > 153* 154* 154* 148* 150*  K 3.6    < > 4.8   < > 3.7   < > 3.3*   < > 3.5 3.5 4.2 4.1 4.6  CL 102   < > 101   < > 104   < > 103   < > 105 109 112* 109 112*  CO2 24   < > 21*   < > 26   < > 28   < > 31 30 29 26 24   GLUCOSE 143*   < > 268*   < > 161*   < > 270*   < > 195* 167* 215* 102* 104*  BUN 51*   < > 54*   < > 59*   < > 70*   < > 62* 59* 53* 52* 53*  CREATININE 2.61*   < > 2.64*   < > 2.49*   < > 2.37*   < > 1.85* 1.71* 1.51* 1.86* 2.02*  CALCIUM 8.8*   < > 8.9   < > 9.1   < > 9.2   < > 9.8 9.4 9.3 9.9 9.7  MG 1.8  --  1.8  --  2.0  --  2.0  --  2.1  --   --   --   --   PHOS 4.3  --   --   --  3.5  --   --   --   --   --   --   --   --    < > = values in this interval not displayed.   GFR: Estimated Creatinine Clearance: 37.5 mL/min (A) (by C-G formula based on SCr of 2.02 mg/dL (H)). Liver Function Tests: Recent Labs  Lab 06/16/19 0349 06/17/19 0403 06/21/19 0853 06/22/19 1046  AST 60* 145* 94* 89*  ALT 34 66* 113* 99*  ALKPHOS 539* 998* 865* 729*  BILITOT 2.2* 3.1* 3.2* 4.3*  PROT 6.4* 6.6 6.5 7.1  ALBUMIN 1.9* 2.0* 2.0* 2.5*   No results for input(s): LIPASE, AMYLASE in the last 168 hours. Recent  Labs  Lab 06/22/19 1046  AMMONIA 53*   Coagulation Profile: No results for input(s): INR, PROTIME in the last 168 hours. Cardiac Enzymes: No results for input(s): CKTOTAL, CKMB, CKMBINDEX, TROPONINI in the last 168 hours. BNP (last 3 results) No results for input(s): PROBNP in the last 8760 hours. HbA1C: No results for input(s): HGBA1C in the last 72 hours. CBG: Recent Labs  Lab 06/21/19 1939 06/22/19 0021 06/22/19 0338 06/22/19 0905 06/22/19 1225  GLUCAP 104* 71 93 105* 92   Lipid Profile: No results for input(s): CHOL, HDL, LDLCALC, TRIG, CHOLHDL, LDLDIRECT in the last 72 hours. Thyroid Function Tests: No results for input(s): TSH, T4TOTAL, FREET4, T3FREE, THYROIDAB in the last 72 hours. Anemia Panel: No results for input(s): VITAMINB12, FOLATE, FERRITIN, TIBC, IRON, RETICCTPCT in the last 72  hours. Sepsis Labs: No results for input(s): PROCALCITON, LATICACIDVEN in the last 168 hours.  Recent Results (from the past 240 hour(s))  MRSA PCR Screening     Status: None   Collection Time: 06/12/19  4:28 PM   Specimen: Nasal Mucosa; Nasopharyngeal  Result Value Ref Range Status   MRSA by PCR NEGATIVE NEGATIVE Final    Comment:        The GeneXpert MRSA Assay (FDA approved for NASAL specimens only), is one component of a comprehensive MRSA colonization surveillance program. It is not intended to diagnose MRSA infection nor to guide or monitor treatment for MRSA infections. Performed at [facility], [address] [place], [place] [address]   Culture, Urine     Status: Abnormal   Collection Time: 06/12/19  5:54 PM   Specimen: Urine, Clean Catch  Result Value Ref Range Status   Specimen Description   Final    URINE, CLEAN CATCH Performed at [facility], [address] [place], [place] [address]    Special Requests   Final    NONE Performed at [facility], [address], [place], [place] [address]    Culture >=100,000 COLONIES/mL ESCHERICHIA COLI (A)  Final   Report Status 06/15/2019 FINAL  Final   Organism ID, Bacteria ESCHERICHIA COLI (A)  Final      Susceptibility   Escherichia coli - MIC*    AMPICILLIN >=32 RESISTANT Resistant     CEFAZOLIN <=4 SENSITIVE Sensitive     CEFTRIAXONE <=0.25 SENSITIVE Sensitive     CIPROFLOXACIN >=4 RESISTANT Resistant     GENTAMICIN <=1 SENSITIVE Sensitive     IMIPENEM <=0.25 SENSITIVE Sensitive     NITROFURANTOIN <=16 SENSITIVE Sensitive     TRIMETH/SULFA >=320 RESISTANT Resistant     AMPICILLIN/SULBACTAM >=32 RESISTANT Resistant     PIP/TAZO <=4 SENSITIVE Sensitive     * >=100,000 COLONIES/mL ESCHERICHIA COLI  Culture, blood (x 2)     Status: None   Collection Time: 06/12/19  7:06 PM   Specimen: BLOOD RIGHT HAND  Result Value Ref Range Status   Specimen Description BLOOD RIGHT HAND  Final   Special Requests   Final     BOTTLES DRAWN AEROBIC AND ANAEROBIC Blood Culture adequate volume   Culture   Final    NO GROWTH 5 DAYS Performed at [facility], [address], [place], [place] [address]    Report Status 06/17/2019 FINAL  Final  Culture, blood (x 2)     Status: None   Collection Time: 06/12/19  7:07 PM   Specimen: BLOOD RIGHT WRIST  Result Value Ref Range Status   Specimen Description BLOOD RIGHT WRIST  Final   Special Requests  Final    BOTTLES DRAWN AEROBIC AND ANAEROBIC Blood Culture adequate volume   Culture   Final    NO GROWTH 5 DAYS Performed at [facility], [address] [place], [place] [address]    Report Status 06/17/2019 FINAL  Final  Culture, respiratory     Status: None   Collection Time: 06/15/19  8:39 AM   Specimen: Tracheal Aspirate; Respiratory  Result Value Ref Range Status   Specimen Description TRACHEAL ASPIRATE  Final   Special Requests NONE  Final   Gram Stain   Final    RARE WBC PRESENT,BOTH PMN AND MONONUCLEAR NO ORGANISMS SEEN    Culture   Final    NO GROWTH 2 DAYS Performed at [facility], [address] [place], [place] [address]    Report Status 06/17/2019 FINAL  Final  Culture, respiratory (non-expectorated)     Status: None (Preliminary result)   Collection Time: 06/21/19  9:37 AM   Specimen: Tracheal Aspirate; Respiratory  Result Value Ref Range Status   Specimen Description TRACHEAL ASPIRATE  Final   Special Requests Normal  Final   Gram Stain   Final    ABUNDANT WBC PRESENT, PREDOMINANTLY PMN NO ORGANISMS SEEN    Culture   Final    RARE YEAST CULTURE REINCUBATED FOR BETTER GROWTH Performed at [facility], [address], [place],  [address]    Report Status PENDING  Incomplete         Radiology Studies: DG CHEST PORT 1 VIEW  Result Date: 06/21/2019 CLINICAL DATA:  Respiratory failure. EXAM: PORTABLE CHEST 1 VIEW COMPARISON:  Radiograph yesterday. FINDINGS: Endotracheal tube tip 3.5 cm from the carina. Enteric tube  in place with tip and side-port below the diaphragm. Right central line tip at the atrial caval junction. Unchanged heart size and mediastinal contours. Unchanged retrocardiac opacity with air bronchograms. Slight worsening patchy right basilar opacity. Probable left pleural effusion. No pneumothorax. IMPRESSION: 1. Slight worsening patchy opacity at the right lung base. 2. Unchanged retrocardiac opacity with air bronchograms. Probable left pleural effusion. 3. Stable support apparatus. Electronically Signed   By: Keith Rake M.D.   On: 06/21/2019 06:17   DG Abd Portable 1V  Result Date: 06/21/2019 CLINICAL DATA:  Respiratory failure.  Abdominal distension. EXAM: PORTABLE ABDOMEN - 1 VIEW COMPARISON:  06/13/2019 FINDINGS: The enteric tube has been removed. Gas noted within nondistended stomach. Diffusely diminished bowel gas identified compared with previous exam. No dilated loops of bowel identified. Bony exostosis arising from the right iliac bone is again noted. IMPRESSION: Diminished bowel gas.  No findings of bowel obstruction however Electronically Signed   By: Kerby Moors M.D.   On: 06/21/2019 10:40   US Abdomen Limited RUQ  Result Date: 06/22/2019 CLINICAL DATA:  Elevated alkaline phosphatase. EXAM: ULTRASOUND ABDOMEN LIMITED RIGHT UPPER QUADRANT COMPARISON:  Noncontrast CT 06/12/2019 FINDINGS: Gallbladder: Gallstones on prior CT are not delineated currently. No gallbladder wall thickening or abnormal distention. No pericholecystic fluid. No sonographic Murphy sign noted by sonographer. Common bile duct: Diameter: 5 mm, normal. Liver: No focal lesion identified. Heterogeneously increased in parenchymal echogenicity. Suggestion of slight capsular nodularity. Portal vein is patent on color Doppler imaging with normal direction of blood flow towards the liver. Other: No right upper quadrant ascites. IMPRESSION: 1. Increased hepatic echogenicity most consistent with hepatic steatosis. Suggestion  of slight capsular nodularity raises possibility of cirrhosis. 2. Calcified gallstones on prior CT are not well delineated on the current exam. Electronically  Signed   By: Keith Rake M.D.   On: 06/22/2019 04:32        Scheduled Meds: . chlorhexidine gluconate (MEDLINE KIT)  15 mL Mouth Rinse BID  . Chlorhexidine Gluconate Cloth  6 each Topical Daily  . folic acid  1 mg Oral Daily  . Gerhardt's butt cream   Topical BID  . heparin injection (subcutaneous)  5,000 Units Subcutaneous Q8H  . insulin aspart  0-15 Units Subcutaneous Q4H  . insulin aspart  6 Units Subcutaneous Q4H  . insulin glargine  15 Units Subcutaneous Daily  . ipratropium-albuterol  3 mL Nebulization BID  . lactulose  10 g Oral Daily  . mouth rinse  15 mL Mouth Rinse BID  . multivitamin with minerals  1 tablet Oral Daily  . polyethylene glycol  17 g Oral Daily  . QUEtiapine  50 mg Oral QHS  . senna-docusate  1 tablet Oral BID  . thiamine  100 mg Oral Daily   Or  . thiamine  100 mg Intravenous Daily   Continuous Infusions: . sodium chloride 10 mL/hr at 06/21/19 1100  . dextrose 50 mL/hr at 06/22/19 1256     Time spent: 40 minutes with over 50% of the time coordinating the patient's care    Harold Hedge, DO [facility] Pager [phone]  Call night coverage person covering after 7pm

## 2019-06-23 ENCOUNTER — Inpatient Hospital Stay (HOSPITAL_COMMUNITY): Payer: Medicare Other

## 2019-06-23 LAB — COMPREHENSIVE METABOLIC PANEL
ALT: 78 U/L — ABNORMAL HIGH (ref 0–44)
AST: 79 U/L — ABNORMAL HIGH (ref 15–41)
Albumin: 2.2 g/dL — ABNORMAL LOW (ref 3.5–5.0)
Alkaline Phosphatase: 759 U/L — ABNORMAL HIGH (ref 38–126)
Anion gap: 13 (ref 5–15)
BUN: 56 mg/dL — ABNORMAL HIGH (ref 8–23)
CO2: 27 mmol/L (ref 22–32)
Calcium: 9.5 mg/dL (ref 8.9–10.3)
Chloride: 113 mmol/L — ABNORMAL HIGH (ref 98–111)
Creatinine, Ser: 2.35 mg/dL — ABNORMAL HIGH (ref 0.61–1.24)
GFR calc Af Amer: 33 mL/min — ABNORMAL LOW (ref >60)
GFR calc non Af Amer: 29 mL/min — ABNORMAL LOW (ref >60)
Glucose, Bld: 136 mg/dL — ABNORMAL HIGH (ref 70–99)
Potassium: 4.8 mmol/L (ref 3.5–5.1)
Sodium: 153 mmol/L — ABNORMAL HIGH (ref 135–145)
Total Bilirubin: 5 mg/dL — ABNORMAL HIGH (ref 0.3–1.2)
Total Protein: 6.6 g/dL (ref 6.5–8.1)

## 2019-06-23 LAB — CBC WITH DIFFERENTIAL/PLATELET
Abs Immature Granulocytes: 0.19 10*3/uL — ABNORMAL HIGH (ref 0.00–0.07)
Abs Immature Granulocytes: 0.13 10*3/uL — ABNORMAL HIGH (ref 0.00–0.07)
Basophils Absolute: 0.1 10*3/uL (ref 0.0–0.1)
Basophils Absolute: 0.1 10*3/uL (ref 0.0–0.1)
Basophils Relative: 0 %
Basophils Relative: 1 %
Eosinophils Absolute: 0.3 10*3/uL (ref 0.0–0.5)
Eosinophils Absolute: 0.4 10*3/uL (ref 0.0–0.5)
Eosinophils Relative: 2 %
Eosinophils Relative: 3 %
HCT: 26.3 % — ABNORMAL LOW (ref 39.0–52.0)
HCT: 26.3 % — ABNORMAL LOW (ref 39.0–52.0)
Hemoglobin: 8.2 g/dL — ABNORMAL LOW (ref 13.0–17.0)
Hemoglobin: 8.1 g/dL — ABNORMAL LOW (ref 13.0–17.0)
Immature Granulocytes: 1 %
Immature Granulocytes: 1 %
Lymphocytes Relative: 15 %
Lymphocytes Relative: 17 %
Lymphs Abs: 2.3 10*3/uL (ref 0.7–4.0)
Lymphs Abs: 2.4 10*3/uL (ref 0.7–4.0)
MCH: 28.8 pg (ref 26.0–34.0)
MCH: 29 pg (ref 26.0–34.0)
MCHC: 31.2 g/dL (ref 30.0–36.0)
MCHC: 30.8 g/dL (ref 30.0–36.0)
MCV: 92.3 fL (ref 80.0–100.0)
MCV: 94.3 fL (ref 80.0–100.0)
Monocytes Absolute: 0.7 10*3/uL (ref 0.1–1.0)
Monocytes Absolute: 0.7 10*3/uL (ref 0.1–1.0)
Monocytes Relative: 4 %
Monocytes Relative: 5 %
Neutro Abs: 11.8 10*3/uL — ABNORMAL HIGH (ref 1.7–7.7)
Neutro Abs: 10.4 10*3/uL — ABNORMAL HIGH (ref 1.7–7.7)
Neutrophils Relative %: 78 %
Neutrophils Relative %: 73 %
Platelets: 379 10*3/uL (ref 150–400)
Platelets: 403 10*3/uL — ABNORMAL HIGH (ref 150–400)
RBC: 2.85 MIL/uL — ABNORMAL LOW (ref 4.22–5.81)
RBC: 2.79 MIL/uL — ABNORMAL LOW (ref 4.22–5.81)
RDW: 15.6 % — ABNORMAL HIGH (ref 11.5–15.5)
RDW: 15.6 % — ABNORMAL HIGH (ref 11.5–15.5)
WBC: 15.3 10*3/uL — ABNORMAL HIGH (ref 4.0–10.5)
WBC: 14 10*3/uL — ABNORMAL HIGH (ref 4.0–10.5)
nRBC: 0 % (ref 0.0–0.2)
nRBC: 0 % (ref 0.0–0.2)

## 2019-06-23 LAB — URINALYSIS, COMPLETE (UACMP) WITH MICROSCOPIC
Bilirubin Urine: NEGATIVE
Glucose, UA: NEGATIVE mg/dL
Ketones, ur: NEGATIVE mg/dL
Nitrite: NEGATIVE
Protein, ur: 30 mg/dL — AB
Specific Gravity, Urine: 1.015 (ref 1.005–1.030)
pH: 7 (ref 5.0–8.0)

## 2019-06-23 LAB — LACTATE DEHYDROGENASE: LDH: 180 U/L (ref 98–192)

## 2019-06-23 LAB — SODIUM, URINE, RANDOM: Sodium, Ur: 85 mmol/L

## 2019-06-23 LAB — GLUCOSE, CAPILLARY
Glucose-Capillary: 100 mg/dL — ABNORMAL HIGH (ref 70–99)
Glucose-Capillary: 108 mg/dL — ABNORMAL HIGH (ref 70–99)
Glucose-Capillary: 116 mg/dL — ABNORMAL HIGH (ref 70–99)
Glucose-Capillary: 125 mg/dL — ABNORMAL HIGH (ref 70–99)
Glucose-Capillary: 131 mg/dL — ABNORMAL HIGH (ref 70–99)
Glucose-Capillary: 66 mg/dL — ABNORMAL LOW (ref 70–99)
Glucose-Capillary: 85 mg/dL (ref 70–99)

## 2019-06-23 LAB — MAGNESIUM: Magnesium: 2.4 mg/dL (ref 1.7–2.4)

## 2019-06-23 LAB — CREATININE, URINE, RANDOM: Creatinine, Urine: 86.86 mg/dL

## 2019-06-23 LAB — AMMONIA: Ammonia: 40 umol/L — ABNORMAL HIGH (ref 9–35)

## 2019-06-23 MED ORDER — SODIUM CHLORIDE 0.9% FLUSH: 10.0000 mL | INTRAVENOUS | Status: DC | PRN

## 2019-06-23 MED ORDER — SODIUM CHLORIDE 0.9 % IV SOLN
2.0000 g | Freq: Two times a day (BID) | INTRAVENOUS | Status: DC
  Administered 2019-06-23 – 2019-06-25 (×3): 2 g via INTRAVENOUS
  Filled 2019-06-23 (×5): qty 2

## 2019-06-23 MED ORDER — BOOST / RESOURCE BREEZE PO LIQD CUSTOM
1.0000 | Freq: Three times a day (TID) | ORAL | Status: DC
  Administered 2019-06-23 – 2019-06-27 (×13): 1 via ORAL

## 2019-06-23 NOTE — Evaluation (Signed)
Physical Therapy Evaluation Patient Details Name: Scott Mcgee MRN: [id number] DOB: 04-Feb-1961 Today's Date: 06/23/2019   History of Present Illness  Pt is 59 yo male with PMH of DM, HTN, and prostate CA.  Admitted on 2/8 with dysuria and fever.  Found to be hyperglycemic, hyponatremic, and positive UTI with sepsis. On 2/9 pt with resp failure and required intubation.  EEG on 2/13 showed profound diffuse encephalopathy.  Pt was extubated on 2/17.  Clinical Impression  Pt admitted with above diagnosis. Pt was able to follow some simple commands and participate with PT/OT co eval. He was lethargic, confused, and unable to provide PLOF.  He required mod/max A of 2 for all transfers.  Performed sit to stand x 4 and stand pivot with Stedy. Pt was able to support some weight on his legs but with poor balance and posterior lean.  Pt currently with functional limitations due to the deficits listed below (see PT Problem List). Pt will benefit from skilled PT to increase their independence and safety with mobility to allow discharge to the venue listed below.       Follow Up Recommendations SNF    Equipment Recommendations  Other (comment)(defer)    Recommendations for Other Services       Precautions / Restrictions Precautions Precautions: Fall Restrictions Weight Bearing Restrictions: No      Mobility  Bed Mobility Overal bed mobility: Needs Assistance Bed Mobility: Supine to Sit     Supine to sit: Max assist;+2 for physical assistance     General bed mobility comments: Pt did reach for therapist hand to assist with trunk elevation  Transfers Overall transfer level: Needs assistance Equipment used: Rolling walker (2 wheeled) Transfers: Sit to/from [employer] Transfers Sit to Stand: +2 physical assistance;Max assist;From elevated surface Stand pivot transfers: Total assist;From elevated surface       General transfer comment: Perfomed sit to stand x 3 from bed and x 1  from [place].  Required assist for correct foot placement and max A to boost up - pt fatigued and required increased assist for last 2 reps.  Used Stedy for pivot to chair.  Ambulation/Gait             General Gait Details: defered  Financial trader Rankin (Stroke Patients Only)       Balance Overall balance assessment: Needs assistance Sitting-balance support: Bilateral upper extremity supported;Feet supported Sitting balance-Leahy Scale: Poor Sitting balance - Comments: Pt initially requiring max A for sitting balance, but after attempts to stand he was able to maintain for a short period of time (30 sec to 1 min) with close SBA.     Standing balance-Leahy Scale: Zero                               Pertinent Vitals/Pain Pain Assessment: Faces Faces Pain Scale: Hurts even more Pain Location: With initial lower extremity movement; denied pain later Pain Descriptors / Indicators: Grimacing;Guarding Pain Intervention(s): Limited activity within patient's tolerance;Monitored during session;Repositioned    Home Living Family/patient expects to be discharged to:: Skilled nursing facility Living Arrangements: Spouse/significant other               Additional Comments: Pt unable to provide any PLOF or home environment information other than that he lives with his wife.    Prior Function  Hand Dominance        Extremity/Trunk Assessment   Upper Extremity Assessment Upper Extremity Assessment: Defer to OT evaluation    Lower Extremity Assessment Lower Extremity Assessment: Difficult to assess due to impaired cognition(Pt was able to demonstrate some functional strength with transfers.  ROM WFL)    Cervical / Trunk Assessment Cervical / Trunk Assessment: Normal  Communication      Cognition Arousal/Alertness: Lethargic Behavior During Therapy: Restless;Flat affect;Impulsive Overall  Cognitive Status: Impaired/Different from baseline Area of Impairment: Orientation;Attention;Memory;Following commands;Safety/judgement;Awareness;Problem solving                 Orientation Level: Disoriented to;Place;Time;Situation     Following Commands: Follows one step commands inconsistently Safety/Judgement: Decreased awareness of safety;Decreased awareness of deficits   Problem Solving: Difficulty sequencing;Requires verbal cues;Requires tactile cues;Slow processing        General Comments      Exercises     Assessment/Plan    PT Assessment Patient needs continued PT services  PT Problem List Decreased strength;Decreased mobility;Decreased safety awareness;Decreased range of motion;Decreased coordination;Decreased activity tolerance;Decreased cognition;Decreased balance;Decreased knowledge of use of DME;Pain       PT Treatment Interventions DME instruction;Therapeutic activities;Cognitive remediation;Gait training;Therapeutic exercise;Patient/family education;Balance training;Functional mobility training;Neuromuscular re-education    PT Goals (Current goals can be found in the Care Plan section)  Acute Rehab PT Goals Patient Stated Goal: unable PT Goal Formulation: Patient unable to participate in goal setting Time For Goal Achievement: 07/07/19 Potential to Achieve Goals: Good    Frequency Min 2X/week   Barriers to discharge        Co-evaluation PT/OT/SLP Co-Evaluation/Treatment: Yes Reason for Co-Treatment: For patient/therapist safety PT goals addressed during session: Mobility/safety with mobility;Balance;Proper use of DME OT goals addressed during session: ADL's and self-care       AM-PAC PT "6 Clicks" Mobility  Outcome Measure Help needed turning from your back to your side while in a flat bed without using bedrails?: Total Help needed moving from lying on your back to sitting on the side of a flat bed without using bedrails?: Total Help needed  moving to and from a bed to a chair (including a wheelchair)?: Total Help needed standing up from a chair using your arms (e.g., wheelchair or bedside chair)?: Total Help needed to walk in hospital room?: Total Help needed climbing 3-5 steps with a railing? : Total 6 Click Score: 6    End of Session Equipment Utilized During Treatment: Gait belt Activity Tolerance: Patient tolerated treatment well Patient left: with chair alarm set;in chair;with call bell/phone within reach Nurse Communication: Mobility status;Need for lift equipment PT Visit Diagnosis: Unsteadiness on feet (R26.81);Muscle weakness (generalized) (M62.81)    Time: 0350-0938 PT Time Calculation (min) (ACUTE ONLY): 38 min   Charges:   PT Evaluation $PT Eval Moderate Complexity: 1 Mod          Maggie Font, PT Acute Rehab Services Pager [phone] [facility] [phone] [facility] [phone]   Karlton Lemon 06/23/2019, 11:38 AM

## 2019-06-23 NOTE — Evaluation (Signed)
Clinical/Bedside Swallow Evaluation Patient Details  Name: Scott Mcgee MRN: [id number] Date of Birth: [birth year]  Today's Date: 06/23/2019 Time: SLP Start Time (ACUTE ONLY): 1340 SLP Stop Time (ACUTE ONLY): 1350 SLP Time Calculation (min) (ACUTE ONLY): 10 min  Past Medical History:  Past Medical History:  Diagnosis Date  . Bipolar 1 disorder ([place])    NO CURRENT MEDS  . Diabetes mellitus without complication ([place])    type 2   . Elevated cholesterol   . Glaucoma    LEFT EYE  . Hypertension   . Prostate cancer [facility])    Past Surgical History:  Past Surgical History:  Procedure Laterality Date  . LYMPHADENECTOMY Bilateral 06/16/2017   Procedure: LYMPHADENECTOMY;  Surgeon: Alexis Frock, MD;  Location: [facility];  Service: Urology;  Laterality: Bilateral;  . PROSTATE BIOPSY    . ROBOT ASSISTED LAPAROSCOPIC RADICAL PROSTATECTOMY N/A 06/16/2017   Procedure: ROBOTIC ASSISTED LAPAROSCOPIC RADICAL PROSTATECTOMY;  Surgeon: Alexis Frock, MD;  Location: [facility];  Service: Urology;  Laterality: N/A;   HPI:  59 yo male with PMH of DM, HTN, and prostate CA.  Admitted on 2/8 with dysuria and fever.  Found to be hyperglycemic, hyponatremic, and positive UTI with sepsis. On 2/9 pt with resp failure and required intubation.  EEG on 2/13 showed profound diffuse encephalopathy.  Pt was extubated on 2/17. Noted to have some oral deficits when drinking water, therefore SLP was ordered.    Assessment / Plan / Recommendation Clinical Impression  Pt presents with a cognitive-based dysphagia related to his encephalopathy with inconsistent recognition of approaching utensils/cup/straw, cues needed to organize oral control for drinking. Once he established a motor pattern, he was able to continue to drink from a straw and manipulate applesauce without difficulty.  There were no s/s of aspiration.  Voice was high-pitched; pt was disoriented, had difficulty following commands,  presented with tremor and  inattention.  Recommend allowing a dysphagia 1 diet with thin liquids for now; pt will need 1:1 assistance with feeding secondary to AMS.  SLP will follow briefly for safety/diet advancement.  SLP Visit Diagnosis: Dysphagia, unspecified (R13.10)    Aspiration Risk       Diet Recommendation   dysphagia 1, thin liquids  Medication Administration: Whole meds with puree    Other  Recommendations Oral Care Recommendations: Oral care BID   Follow up Recommendations Other (comment)(tba)      Frequency and Duration min 2x/week  1 week       Prognosis Barriers to Reach Goals: Cognitive deficits      Swallow Study   General HPI: 59 yo male with PMH of DM, HTN, and prostate CA.  Admitted on 2/8 with dysuria and fever.  Found to be hyperglycemic, hyponatremic, and positive UTI with sepsis. On 2/9 pt with resp failure and required intubation.  EEG on 2/13 showed profound diffuse encephalopathy.  Pt was extubated on 2/17. Noted to have some oral deficits when drinking water, therefore SLP was ordered.  Type of Study: Bedside Swallow Evaluation Previous Swallow Assessment: no Diet Prior to this Study: Thin liquids Temperature Spikes Noted: No Respiratory Status: Nasal cannula History of Recent Intubation: Yes Length of Intubations (days): 8 days Date extubated: 06/21/19 Behavior/Cognition: Confused;Impulsive Oral Cavity Assessment: Within Functional Limits Self-Feeding Abilities: Needs assist Patient Positioning: Upright in chair Baseline Vocal Quality: Other (comment)(high pitched) Volitional Cough: Cognitively unable to elicit Volitional Swallow: Unable to elicit    Oral/Motor/Sensory Function Overall Oral Motor/Sensory Function: Within functional limits   Ice  Chips Ice chips: Within functional limits   Thin Liquid Thin Liquid: Within functional limits    Nectar Thick Nectar Thick Liquid: Not tested   Honey Thick Honey Thick Liquid: Not tested   Puree Puree: Within functional  limits   Solid     Solid: Not tested      [name] 06/23/2019,2:50 PM   Estill Bamberg L. Tivis Ringer, [place] Office number [phone] Pager [phone]

## 2019-06-23 NOTE — Progress Notes (Addendum)
Explained to pt that this RN was wrapping midline dressing to reinforce it. Pt stated in mumbling, garbled speech "you ain't wrapping shit up, I'm going home" and has been attempting to get out of bed. Pt grunting, attempting to take mittens off.  0400: On call hospitalist notified re: pt having episodes of fine/moderate tremors, stiff body, eyes rolling in back of head, not saying anything, sometimes grunting - but pupils are reactive, pt becomes alert and responsive again to physical stimuli.   0430: Pt reports seeing things near the ceiling. Throughout shift, pt has gone through periods of anxiety, agitation, sensitivity to light and sound, and constant tremors. He has on many occasions attempted to leave the bed stating "I want to go home"

## 2019-06-23 NOTE — Progress Notes (Signed)
PROGRESS NOTE    Scott Mcgee    Code Status: Full Code  IOE:[id number] DOB: Mar 24, 1961 DOA: 06/12/2019 LOS: 11 days  PCP: [facility]:  Chief Complaint  Patient presents with  . [facility] Summary  This is a 59 year old male with past medical history of diabetes, hypertension, prostate cancer who was admitted on 2/8 with dysuria, low-grade fevers for several days as well as dysuria x2 weeks.  To have hyperglycemia, hyponatremia and elevated creatinine ED UA positive for UTI.  CT abdomen/pelvis with stranding around kidneys without other abnormalities.  Started on insulin drip and IV fluids.  PCCM consulted on 2/9 for acute hypoxemic respiratory failure and in respiratory distress and patient was subsequently intubated.  Meropenem changed to ceftriaxone on 2/10.  Change in mental status on 2/13 noted by PCCM with bradycardia and EEG was ordered and patient was further sedated.  EEG showed profound diffuse encephalopathy without specific etiology and no seizures or epileptiform discharges.  Patient was successfully extubated on 2/17.  Patient transferred to progressive floor and TRH resumed care 2/18.  2/18: Noted to have CIWA 17 early a.m. with frequent Ativan administration per CIWA protocol. Diaphoresis, Rigors noted with abdominal distention.  Ammonia level minimally elevated.  Started on lactulose.  Also started on D5W for hypernatremia and free water deficit.  Foley catheter placed due to urinary retention.  Repeat blood cultures ordered and US guided paracentesis ordered.   A & P   Active Problems:   Type 2 diabetes mellitus with hyperosmolar hyperglycemic state (HHS) ([place])   Hyponatremia   HTN (hypertension)   Acute renal failure ([facility])   Abdominal distention   Alcohol use   Sepsis ([place])   Acute pyelonephritis   Acute respiratory failure ([place])   1. Severe sepsis secondary to multidrug resistant E. coli UTI, possible aspiration  pneumonia development a. Completed 7-day course of ceftriaxone as well as 2 days of meropenem b. Currently afebrile with improved WBC today c. Febrile overnight, still with tachycardia, rigors, diaphoresis, renal function as worsened today, still requiring O2.  BP stable d. MRSA nares negative e. CXR with slight worsening patchy opacity in right lung base f. Repeat blood cultures, no growth to date g. Procalcitonin 1.9 h. will check lactic acid i. will start empiric cefepime 2. Acute metabolic encephalopathy, multifactorial: Sepsis, hypernatremia, AKI, hepatic encephalopathy, hypoxemia respiratory failure a. CT brain negative for acute abnormality b. Continue to treat underlying conditions c. Can consider MRI brain although his tremors will likely make this a difficult study at this time to reliably obtain 3. Hypernatremia due to poor p.o. intake a. Has worsened overnight b. Continue D5W c. Recheck this evening 4. 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 intake, sepsis, acute on chronic heart failure and hemodynamic changes and concern for ischemic ATN a. Initially improved, however now worsened b. Nephrology consulted 5. Alcohol abuse a. Still getting high amounts of Ativan however Last drink was over 10 days ago, prior to admission, he is unlikely withdrawing at this point b. Decrease frequency of Ativan and taper off to prevent withdrawal seizure 6. Abdominal distention a. This is patient's normal body habitus b. Continue bowel regimen 7. Concern for hepatic encephalopathy in setting of Alcohol use and suspected cirrhosis on ultrasound a. Elevated ammonia improved with lactulose b. Alk phos 865->729->759, check GGT.  If elevated will check AMA c. AST/ALT improved d. RUQ ultrasound with hepatic steatosis,  suspected cirrhosis and without cholelithiasis e. T bili elevated 3.2-> 4.3->5.0 with scleral icterus f. Ultrasound-guided paracentesis ordered,  no ascites noted g. Continue lactulose h. Consider GI i. EEG unremarkable for seizure 8. Acute hypoxemic respiratory failure, concern for volume overload as well as possible aspiration pneumonia a. Procalcitonin 1.9 b. Start on cefepime c. Recheck CXR  d. Daily weights and strict I's and O's with Foley catheter 9. Mild urinary retention, unknown etiology a. Foley catheter placed 2/18 b. Nephro to check bladder scan and possible renal ultrasound c. Continue to evaluate for void trial in a.m 10. HFpEF a. Stable volume today b. I/os and Daily weights 11. Normocytic anemia, suspect AoCD a. Differential unremarkable b. With elevated bilirubin will check LDH and haptoglobin to check for hemolysis 12. Diabetes a. Continue current regimen   DVT prophylaxis: Subcu heparin Family Communication: Patient's wife has been updated  Disposition Plan:   Patient came from:  Home                                                                                          Anticipated d/c place: SNF  Barriers to d/c: Medical stability  Pressure injury documentation    None  Consultants  PCCM  Procedures  ETT 2/9-2/17 CT abd  2/8: Cholelithiasis without complicating factors. Mild perinephric stranding is noted on the left without obstructive change. Underlying UTI could not be totally excluded. Mild bibasilar atelectasis. 2/9 echo: LVEF 60-65%, LVH 2/13 EEG:   Antibiotics   Anti-infectives (From admission, onward)   Start     Dose/Rate Route Frequency Ordered Stop   06/14/19 1830  cefTRIAXone (ROCEPHIN) 2 g in sodium chloride 0.9 % 100 mL IVPB     2 g 200 mL/hr over 30 Minutes Intravenous Every 24 hours 06/14/19 1020 06/20/19 1803   06/12/19 1830  meropenem (MERREM) 1 g in sodium chloride 0.9 % 100 mL IVPB  Status:  Discontinued     1 g 200 mL/hr over 30 Minutes Intravenous Every 12 hours 06/12/19 1818 06/14/19 1020   06/12/19 1745  cefTRIAXone (ROCEPHIN) 2 g in sodium chloride 0.9 %  100 mL IVPB  Status:  Discontinued     2 g 200 mL/hr over 30 Minutes Intravenous Every 24 hours 06/12/19 1743 06/12/19 1814        Subjective   Rapid response overnight due to mental status change similar to prior changes.  Unknown etiology, restarted D5W and Ativan ordered, rectal Tylenol ordered.  Today, patient continued to be tremulous and having difficulty with getting words out.  Objective   Vitals:   06/23/19 0700 06/23/19 0930 06/23/19 1200 06/23/19 1600  BP:  108/78 117/79   Pulse: 88     Resp: 16     Temp:  98.4 F (36.9 C) 98.2 F (36.8 C) 98 F (36.7 C)  TempSrc:  Axillary Axillary Axillary  SpO2: 97%     Weight:      Height:        Intake/Output Summary (Last 24 hours) at 06/23/2019 1714 Last data filed at 06/23/2019 1700 Gross per 24 hour  Intake 1100 ml  Output 700 ml  Net 400 ml   Filed Weights   06/19/19 0500 06/20/19 0404 06/21/19 0456  Weight: 74.7 kg 75.6 kg 77 kg    Examination:  Physical Exam Vitals and nursing note reviewed.  Constitutional:      Appearance: He is ill-appearing, toxic-appearing and diaphoretic.  Eyes:     General: Scleral icterus present.  Cardiovascular:     Rate and Rhythm: Regular rhythm. Tachycardia present.  Pulmonary:     Effort: No respiratory distress.     Comments: Difficult to evaluate by auscultation Abdominal:     General: There is distension.  Musculoskeletal:        General: No swelling or tenderness.  Neurological:     Comments: Tremulous Unable to check for asterixis Difficulty in assessing orientation as he is having difficulty verbalizing     Data Reviewed: I have personally reviewed following labs and imaging studies  CBC: Recent Labs  Lab 06/20/19 0343 06/21/19 0853 06/22/19 0321 06/23/19 0430 06/23/19 0953  WBC 13.6* 15.3* 16.0* 15.3* 14.0*  NEUTROABS 10.8* 12.3* 12.4* 11.8* 10.4*  HGB 10.1* 9.5* 9.4* 8.2* 8.1*  HCT 32.2* 31.5* 30.6* 26.3* 26.3*  MCV 93.1 96.3 93.3 92.3 94.3    PLT 357 354 392 379 188*   Basic Metabolic Panel: Recent Labs  Lab 06/17/19 0403 06/17/19 0403 06/18/19 0536 06/18/19 0536 06/19/19 0445 06/19/19 0445 06/20/19 0343 06/21/19 0853 06/22/19 0321 06/22/19 1046 06/23/19 0430  NA 144   < > 146*   < > 153*   < > 154* 154* 148* 150* 153*  K 3.7   < > 3.3*   < > 3.5   < > 3.5 4.2 4.1 4.6 4.8  CL 104   < > 103   < > 105   < > 109 112* 109 112* 113*  CO2 26   < > 28   < > 31   < > 30 29 26 24 27   GLUCOSE 161*   < > 270*   < > 195*   < > 167* 215* 102* 104* 136*  BUN 59*   < > 70*   < > 62*   < > 59* 53* 52* 53* 56*  CREATININE 2.49*   < > 2.37*   < > 1.85*   < > 1.71* 1.51* 1.86* 2.02* 2.35*  CALCIUM 9.1   < > 9.2   < > 9.8   < > 9.4 9.3 9.9 9.7 9.5  MG 2.0  --  2.0  --  2.1  --   --   --   --   --  2.4  PHOS 3.5  --   --   --   --   --   --   --   --   --   --    < > = values in this interval not displayed.   GFR: Estimated Creatinine Clearance: 32.3 mL/min (A) (by C-G formula based on SCr of 2.35 mg/dL (H)). Liver Function Tests: Recent Labs  Lab 06/17/19 0403 06/21/19 0853 06/22/19 1046 06/23/19 0430  AST 145* 94* 89* 79*  ALT 66* 113* 99* 78*  ALKPHOS 998* 865* 729* 759*  BILITOT 3.1* 3.2* 4.3* 5.0*  PROT 6.6 6.5 7.1 6.6  ALBUMIN 2.0* 2.0* 2.5* 2.2*   No results for input(s): LIPASE, AMYLASE in the last 168 hours. Recent Labs  Lab 06/22/19 1046 06/23/19 0430  AMMONIA 53* 40*   Coagulation Profile: No results for input(s): INR, PROTIME in the last 168  hours. Cardiac Enzymes: No results for input(s): CKTOTAL, CKMB, CKMBINDEX, TROPONINI in the last 168 hours. BNP (last 3 results) No results for input(s): PROBNP in the last 8760 hours. HbA1C: No results for input(s): HGBA1C in the last 72 hours. CBG: Recent Labs  Lab 06/23/19 0044 06/23/19 0408 06/23/19 0738 06/23/19 1209 06/23/19 1652  GLUCAP 100* 131* 125* 116* 66*   Lipid Profile: No results for input(s): CHOL, HDL, LDLCALC, TRIG, CHOLHDL, LDLDIRECT in  the last 72 hours. Thyroid Function Tests: No results for input(s): TSH, T4TOTAL, FREET4, T3FREE, THYROIDAB in the last 72 hours. Anemia Panel: Recent Labs    06/22/19 1614  FERRITIN 918*  TIBC 252  IRON 88   Sepsis Labs: Recent Labs  Lab 06/22/19 1614  PROCALCITON 1.90    Recent Results (from the past 240 hour(s))  Culture, respiratory     Status: None   Collection Time: 06/15/19  8:39 AM   Specimen: Tracheal Aspirate; Respiratory  Result Value Ref Range Status   Specimen Description TRACHEAL ASPIRATE  Final   Special Requests NONE  Final   Gram Stain   Final    RARE WBC PRESENT,BOTH PMN AND MONONUCLEAR NO ORGANISMS SEEN    Culture   Final    NO GROWTH 2 DAYS Performed at [facility], [address], [place], [place] [address]    Report Status 06/17/2019 FINAL  Final  Culture, respiratory (non-expectorated)     Status: None (Preliminary result)   Collection Time: 06/21/19  9:37 AM   Specimen: Tracheal Aspirate; Respiratory  Result Value Ref Range Status   Specimen Description TRACHEAL ASPIRATE  Final   Special Requests Normal  Final   Gram Stain   Final    ABUNDANT WBC PRESENT, PREDOMINANTLY PMN NO ORGANISMS SEEN    Culture   Final    RARE YEAST CULTURE REINCUBATED FOR BETTER GROWTH Performed at [facility], [address], [place], [place] [address]    Report Status PENDING  Incomplete  Culture, blood (routine x 2)     Status: None (Preliminary result)   Collection Time: 06/22/19 10:42 AM   Specimen: BLOOD  Result Value Ref Range Status   Specimen Description BLOOD LEFT ANTECUBITAL  Final   Special Requests   Final    BOTTLES DRAWN AEROBIC AND ANAEROBIC Blood Culture results may not be optimal due to an excessive volume of blood received in culture bottles   Culture   Final    NO GROWTH < 24 HOURS Performed at [facility], [place] [address] [place], [place] [address]    Report Status PENDING  Incomplete  Culture, blood (routine x 2)      Status: None (Preliminary result)   Collection Time: 06/22/19 10:47 AM   Specimen: BLOOD RIGHT HAND  Result Value Ref Range Status   Specimen Description BLOOD RIGHT HAND  Final   Special Requests   Final    BOTTLES DRAWN AEROBIC AND ANAEROBIC Blood Culture results may not be optimal due to an excessive volume of blood received in culture bottles   Culture   Final    NO GROWTH < 24 HOURS Performed at [facility], [place] [address], [place], [place] [address]    Report Status PENDING  Incomplete         Radiology Studies: [place] ASCITES (ABDOMEN LIMITED)  Result Date: 06/23/2019 CLINICAL DATA:  59 year old with increased abdominal girth. Evaluate for abdominal ascites. EXAM: LIMITED ABDOMEN ULTRASOUND FOR ASCITES TECHNIQUE: Limited  ultrasound survey for ascites was performed in all four abdominal quadrants. COMPARISON:  06/22/2019 and CT 06/12/2019 FINDINGS: Markedly increased echogenicity in the visualized liver. No ascites identified. IMPRESSION: 1. No ascites. 2. Increased echogenicity in the liver. Findings are suggestive for hepatic steatosis. Electronically Signed   By: Markus Daft M.D.   On: 06/23/2019 07:48   US Abdomen Limited RUQ  Result Date: 06/22/2019 CLINICAL DATA:  Elevated alkaline phosphatase. EXAM: ULTRASOUND ABDOMEN LIMITED RIGHT UPPER QUADRANT COMPARISON:  Noncontrast CT 06/12/2019 FINDINGS: Gallbladder: Gallstones on prior CT are not delineated currently. No gallbladder wall thickening or abnormal distention. No pericholecystic fluid. No sonographic Murphy sign noted by sonographer. Common bile duct: Diameter: 5 mm, normal. Liver: No focal lesion identified. Heterogeneously increased in parenchymal echogenicity. Suggestion of slight capsular nodularity. Portal vein is patent on color Doppler imaging with normal direction of blood flow towards the liver. Other: No right upper quadrant ascites. IMPRESSION: 1. Increased hepatic echogenicity most consistent with hepatic  steatosis. Suggestion of slight capsular nodularity raises possibility of cirrhosis. 2. Calcified gallstones on prior CT are not well delineated on the current exam. Electronically Signed   By: Keith Rake M.D.   On: 06/22/2019 04:32        Scheduled Meds: . chlorhexidine gluconate (MEDLINE KIT)  15 mL Mouth Rinse BID  . Chlorhexidine Gluconate Cloth  6 each Topical Daily  . feeding supplement  1 Container Oral TID BM  . folic acid  1 mg Oral Daily  . Gerhardt's butt cream   Topical BID  . heparin injection (subcutaneous)  5,000 Units Subcutaneous Q8H  . insulin aspart  0-15 Units Subcutaneous Q4H  . insulin aspart  6 Units Subcutaneous Q4H  . insulin glargine  15 Units Subcutaneous Daily  . ipratropium-albuterol  3 mL Nebulization BID  . lactulose  10 g Oral Daily  . mouth rinse  15 mL Mouth Rinse BID  . multivitamin with minerals  1 tablet Oral Daily  . polyethylene glycol  17 g Oral Daily  . QUEtiapine  50 mg Oral QHS  . senna-docusate  1 tablet Oral BID  . thiamine  100 mg Oral Daily   Or  . thiamine  100 mg Intravenous Daily   Continuous Infusions: . sodium chloride 10 mL/hr at 06/21/19 1100  . dextrose 50 mL/hr at 06/23/19 0930     Time spent: 38 minutes with over 50% of the time coordinating the patient's care    Harold Hedge, DO [facility] Pager [phone]  Call night coverage person covering after 7pm

## 2019-06-23 NOTE — Evaluation (Signed)
Occupational Therapy Evaluation Patient Details Name: Scott Mcgee MRN: [id number] DOB: Mar 06, 1961 Today's Date: 06/23/2019    History of Present Illness Pt is 59 yo male with PMH of DM, HTN, and prostate CA.  Admitted on 2/8 with dysuria and fever.  Found to be hyperglycemic, hyponatremic, and positive UTI with sepsis. On 2/9 pt with resp failure and required intubation.  EEG on 2/13 showed profound diffuse encephalopathy.  Pt was extubated on 2/17.   Clinical Impression   Pt presents with impaired cognition, generalized weakness and poor sitting and standing balance. He requires +2 assist for all mobility and total assist for ADL. Pt with spillage when offered a drink of water. MD in room and in agreement that pt needs a ST consult. Recommending SNF upon discharge. Will follow acutely.    Follow Up Recommendations  SNF;Supervision/Assistance - 24 hour    Equipment Recommendations  Other (comment)(defer to next venue)    Recommendations for Other Services Speech consult     Precautions / Restrictions Precautions Precautions: Fall Restrictions Weight Bearing Restrictions: No      Mobility Bed Mobility Overal bed mobility: Needs Assistance Bed Mobility: Supine to Sit     Supine to sit: Max assist;+2 for physical assistance     General bed mobility comments: assist for LEs over EOB and to raise trunk, pt with strong posterior lean   Transfers Overall transfer level: Needs assistance Equipment used: Rolling walker (2 wheeled) Transfers: Sit to/from [facility] Sit to Stand: +2 physical assistance;Max assist;From elevated surface Stand pivot transfers: Total assist;From elevated surface       General transfer comment: Perfomed sit to stand x 3 from bed and x 1 from [place].  Required assist for correct foot placement and max A to boost up - pt fatigued and required increased assist for last 2 reps.  Used Stedy for pivot to chair.    Balance Overall  balance assessment: Needs assistance Sitting-balance support: Bilateral upper extremity supported;Feet supported Sitting balance-Leahy Scale: Poor Sitting balance - Comments: Pt initially requiring max A for sitting balance, but after attempts to stand he was able to maintain for a short period of time (30 sec to 1 min) with close SBA.     Standing balance-Leahy Scale: Zero                             ADL either performed or assessed with clinical judgement   ADL                                         General ADL Comments: pt requiring total assist      Vision   Additional Comments: vision appears intact, difficult to assess formally due to cognition     Perception     Praxis      Pertinent Vitals/Pain Pain Assessment: Faces Faces Pain Scale: Hurts even more Pain Location: With initial lower extremity movement; denied pain later Pain Descriptors / Indicators: Grimacing;Guarding Pain Intervention(s): Monitored during session     Hand Dominance     Extremity/Trunk Assessment Upper Extremity Assessment Upper Extremity Assessment: Generalized weakness   Lower Extremity Assessment Lower Extremity Assessment: Defer to PT evaluation   Cervical / Trunk Assessment Cervical / Trunk Assessment: Normal   Communication Communication Communication: Expressive difficulties   Cognition Arousal/Alertness: Lethargic(more alert after mouth cleaned  out once in chair) Behavior During Therapy: Restless;Flat affect;Impulsive Overall Cognitive Status: Impaired/Different from baseline Area of Impairment: Orientation;Attention;Memory;Following commands;Safety/judgement;Awareness;Problem solving                 Orientation Level: Disoriented to;Place;Time;Situation Current Attention Level: Focused   Following Commands: Follows one step commands inconsistently Safety/Judgement: Decreased awareness of safety;Decreased awareness of deficits Awareness:  Intellectual Problem Solving: Difficulty sequencing;Requires verbal cues;Requires tactile cues;Slow processing     General Comments       Exercises     Shoulder Instructions      Home Living Family/patient expects to be discharged to:: Skilled nursing facility Living Arrangements: Spouse/significant other                               Additional Comments: Pt unable to provide any PLOF or home environment information other than that he lives with his wife.      Prior Functioning/Environment                   OT Problem List: Decreased strength;Decreased activity tolerance;Impaired balance (sitting and/or standing);Decreased knowledge of use of DME or AE;Impaired UE functional use;Decreased cognition;Decreased coordination;Decreased safety awareness      OT Treatment/Interventions: Self-care/ADL training;Therapeutic activities;Patient/family education;Balance training;DME and/or AE instruction    OT Goals(Current goals can be found in the care plan section) Acute Rehab OT Goals Patient Stated Goal: unable OT Goal Formulation: Patient unable to participate in goal setting Time For Goal Achievement: 07/07/19 Potential to Achieve Goals: Good ADL Goals Pt Will Perform Grooming: with mod assist;sitting Pt Will Transfer to Toilet: with mod assist;stand pivot transfer Additional ADL Goal #1: Pt will perform bed mobility with moderate assistance in preparation for ADL. Additional ADL Goal #2: Pt will sit EOB with fair sitting balance in preparation for ADL. Additional ADL Goal #3: Pt will follow commands with 50% accuracy. Additional ADL Goal #4: Pt will utilize call button appropriately to request assistance.  OT Frequency: Min 2X/week   Barriers to D/C:            Co-evaluation PT/OT/SLP Co-Evaluation/Treatment: Yes Reason for Co-Treatment: For patient/therapist safety;Complexity of the patient's impairments (multi-system involvement) PT goals addressed  during session: Mobility/safety with mobility;Balance;Proper use of DME OT goals addressed during session: ADL's and self-care;Strengthening/ROM;Proper use of Adaptive equipment and DME      AM-PAC OT "6 Clicks" Daily Activity     Outcome Measure Help from another person eating meals?: Total Help from another person taking care of personal grooming?: Total Help from another person toileting, which includes using toliet, bedpan, or urinal?: Total Help from another person bathing (including washing, rinsing, drying)?: Total Help from another person to put on and taking off regular upper body clothing?: Total Help from another person to put on and taking off regular lower body clothing?: Total 6 Click Score: 6   End of Session Equipment Utilized During Treatment: Gait belt;Oxygen Nurse Communication: Mobility status;Need for lift equipment(MD aware pt was not swallowing well)  Activity Tolerance: Patient tolerated treatment well Patient left: in chair;with call bell/phone within reach;with chair alarm set  OT Visit Diagnosis: Unsteadiness on feet (R26.81);Muscle weakness (generalized) (M62.81);Other symptoms and signs involving cognitive function                Time: 1035-1110 OT Time Calculation (min): 35 min Charges:  OT General Charges $OT Visit: 1 Visit OT Evaluation $OT Eval Moderate Complexity: 1 Mod  Almyra Free  Yunique Dearcos, OTR/L Acute Rehabilitation Services Pager: [phone] Office: [phone]  Malka So 06/23/2019, 1:13 PM

## 2019-06-23 NOTE — Progress Notes (Signed)
Pharmacy Antibiotic Note  Scott Mcgee is a 59 y.o. male admitted on 06/12/2019 with sepsis concern for abdominal infection vs. UTI.   Patient has been treated with 7 day course of antibiotics for ecoli uti, possible aspiration.   Febrile overnight to 100.8, currently afebrile, he continues to be tachycardic with rigors, and diaphoresis. Pct 1.9. Orders to restart empiric antibiotics with cefepime. Patient still with impaired renal function. Will adjust dose as needed.   Plan: - Cefepime 2g IV q12 hours - monitor renal function and f/u cultures  Height: 5\' 6"  (167.6 cm) Weight: 169 lb 12.1 oz (77 kg) IBW/kg (Calculated) : 63.8  Temp (24hrs), Avg:98.9 F (37.2 C), Min:98 F (36.7 C), Max:100.8 F (38.2 C)  Recent Labs  Lab 06/20/19 0343 06/21/19 0853 06/22/19 0321 06/22/19 1046 06/23/19 0430 06/23/19 0953  WBC 13.6* 15.3* 16.0*  --  15.3* 14.0*  CREATININE 1.71* 1.51* 1.86* 2.02* 2.35*  --     Estimated Creatinine Clearance: 32.3 mL/min (A) (by C-G formula based on SCr of 2.35 mg/dL (H)).    Allergies  Allergen Reactions  . Clotrimazole 3 [Clotrimazole] Hives    cream    Antimicrobials this admission: Meropenem 2/8>>2/10 Ceftriaxone 2/10>>2/16  Microbiology results: 2/18 bld x2: ngtd 2/17 TA: reincubate 2/11 RCx: negative 2/8BCx: negative 2/8UCx: E.coli sensitive to cefazolin and ceftriaxone  2/8 MRSA PCR: negative COVID (-) 2/11: TA neg   Thank you for allowing pharmacy to be a part of this patient's care.  Erin Hearing PharmD., BCPS Clinical Pharmacist 06/23/2019 6:12 PM

## 2019-06-23 NOTE — Consult Note (Signed)
Reason for Consult: AKI/CKD stage 3 Referring Physician:  Neysa Bonito, MD  Scott Mcgee is an 59 y.o. male.  HPI:  Pt with a PMH significant for DM, HTN, alcohol abuse, prostate cancer s/p resection who presented to [facility] on 06/12/19 with hyperglycemia and found to have AKI and urosepsis.  He was admitted to the ICU on insulin drip and started IV antibiotics.  He was transferred to [facility] on 06/13/19. His AKI started to improve and his creatinine reached a nadir of 1.51 on 06/21/19 but has since started to climb.  We were consulted to further evaluate his recurring AKI/CKD stage 3.  His UOP started to decline over the past few days and has had drops in BP.  The trend in Scr is seen below.  His hospital course was further complicated by VDRF, AMS thought to be alcohol withdrawal, and hypernatremia.   Trend in Creatinine: Creatinine, Ser  Date/Time Value Ref Range Status  06/23/2019 04:30 AM 2.35 (H) 0.61 - 1.24 mg/dL Final  06/22/2019 10:46 AM 2.02 (H) 0.61 - 1.24 mg/dL Final  06/22/2019 03:21 AM 1.86 (H) 0.61 - 1.24 mg/dL Final  06/21/2019 08:53 AM 1.51 (H) 0.61 - 1.24 mg/dL Final  06/20/2019 03:43 AM 1.71 (H) 0.61 - 1.24 mg/dL Final  06/19/2019 04:45 AM 1.85 (H) 0.61 - 1.24 mg/dL Final  06/18/2019 05:36 AM 2.37 (H) 0.61 - 1.24 mg/dL Final  06/17/2019 04:03 AM 2.49 (H) 0.61 - 1.24 mg/dL Final  06/16/2019 11:39 AM 2.64 (H) 0.61 - 1.24 mg/dL Final  06/16/2019 03:49 AM 2.61 (H) 0.61 - 1.24 mg/dL Final  06/15/2019 03:38 PM 3.06 (H) 0.61 - 1.24 mg/dL Final  06/15/2019 08:50 AM 3.17 (H) 0.61 - 1.24 mg/dL Final  06/14/2019 06:11 AM 3.82 (H) 0.61 - 1.24 mg/dL Final  06/14/2019 02:30 AM 3.96 (H) 0.61 - 1.24 mg/dL Final  06/13/2019 06:51 PM 3.95 (H) 0.61 - 1.24 mg/dL Final  06/13/2019 06:48 AM 3.54 (H) 0.61 - 1.24 mg/dL Final  06/13/2019 02:49 AM 3.41 (H) 0.61 - 1.24 mg/dL Final  06/12/2019 10:10 PM 3.52 (H) 0.61 - 1.24 mg/dL Final  06/12/2019 06:54 PM 3.61 (H) 0.61 - 1.24 mg/dL Final  06/12/2019 12:53 PM  3.49 (H) 0.61 - 1.24 mg/dL Final  06/12/2019 09:27 AM 3.56 (H) 0.61 - 1.24 mg/dL Final  06/17/2017 05:03 AM 1.00 0.61 - 1.24 mg/dL Final  06/14/2017 11:11 AM 1.38 (H) 0.61 - 1.24 mg/dL Final  05/17/2017 11:13 AM 1.27 (H) 0.61 - 1.24 mg/dL Final    PMH:   Past Medical History:  Diagnosis Date  . Bipolar 1 disorder ([place])    NO CURRENT MEDS  . Diabetes mellitus without complication ([place])    type 2   . Elevated cholesterol   . Glaucoma    LEFT EYE  . Hypertension   . Prostate cancer ([place])     PSH:   Past Surgical History:  Procedure Laterality Date  . LYMPHADENECTOMY Bilateral 06/16/2017   Procedure: LYMPHADENECTOMY;  Surgeon: Alexis Frock, MD;  Location: [facility];  Service: Urology;  Laterality: Bilateral;  . PROSTATE BIOPSY    . ROBOT ASSISTED LAPAROSCOPIC RADICAL PROSTATECTOMY N/A 06/16/2017   Procedure: ROBOTIC ASSISTED LAPAROSCOPIC RADICAL PROSTATECTOMY;  Surgeon: Alexis Frock, MD;  Location: [facility];  Service: Urology;  Laterality: N/A;    Allergies:  Allergies  Allergen Reactions  . Clotrimazole 3 [Clotrimazole] Hives    cream    Medications:   Prior to Admission medications   Medication Sig Start Date End Date Taking? Authorizing  Provider  acetaminophen (TYLENOL) 500 MG tablet Take 500 mg by mouth every 6 (six) hours as needed for mild pain or moderate pain.   Yes [provider]  AMLODIPINE BESYLATE PO Take 10 mg by mouth every morning.   Yes [provider]  atorvastatin (LIPITOR) 20 MG tablet Take 20 mg by mouth daily.  02/24/17  Yes [provider]  hydrochlorothiazide (HYDRODIURIL) 25 MG tablet Take 25 mg by mouth daily.  02/24/17  Yes [provider]  metFORMIN (GLUCOPHAGE) 500 MG tablet Take 500 mg by mouth daily with breakfast.  02/24/17  Yes [provider]  multivitamin-iron-minerals-folic acid (CENTRUM) chewable tablet Chew 1 tablet by mouth daily.   Yes [provider]  polyethylene glycol (MIRALAX /  GLYCOLAX) 17 g packet Take 17 g by mouth daily.   Yes [provider]  HYDROcodone-acetaminophen (NORCO) 5-325 MG tablet Take 1-2 tablets by mouth every 6 (six) hours as needed for moderate pain or severe pain. Patient not taking: Reported on 06/12/2019 06/16/17   Debbrah Alar, PA-C  senna-docusate (SENOKOT-S) 8.6-50 MG tablet Take 1 tablet by mouth 2 (two) times daily. While taking pain meds to prevent constipation Patient not taking: Reported on 06/12/2019 06/17/17   Alexis Frock, MD    Inpatient medications: . chlorhexidine gluconate (MEDLINE KIT)  15 mL Mouth Rinse BID  . Chlorhexidine Gluconate Cloth  6 each Topical Daily  . feeding supplement  1 Container Oral TID BM  . folic acid  1 mg Oral Daily  . Gerhardt's butt cream   Topical BID  . heparin injection (subcutaneous)  5,000 Units Subcutaneous Q8H  . insulin aspart  0-15 Units Subcutaneous Q4H  . insulin aspart  6 Units Subcutaneous Q4H  . insulin glargine  15 Units Subcutaneous Daily  . ipratropium-albuterol  3 mL Nebulization BID  . lactulose  10 g Oral Daily  . mouth rinse  15 mL Mouth Rinse BID  . multivitamin with minerals  1 tablet Oral Daily  . polyethylene glycol  17 g Oral Daily  . QUEtiapine  50 mg Oral QHS  . senna-docusate  1 tablet Oral BID  . thiamine  100 mg Oral Daily   Or  . thiamine  100 mg Intravenous Daily    Discontinued Meds:   Medications Discontinued During This Encounter  Medication Reason  . insulin regular, human (MYXREDLIN) 100 units/ 100 mL infusion   . 0.9 %  sodium chloride infusion   . dextrose 5 %-0.45 % sodium chloride infusion   . dextrose 50 % solution 0-50 mL   . MEDLINE mouth rinse   . sulfamethoxazole-trimethoprim (BACTRIM DS,SEPTRA DS) 800-160 MG tablet   . cefTRIAXone (ROCEPHIN) 2 g in sodium chloride 0.9 % 100 mL IVPB   . 0.9 %  sodium chloride infusion   . dextrose 5 %-0.45 % sodium chloride infusion   . fentaNYL 2523mg in NS 2533m(1069mml) infusion-PREMIX   .  acetaminophen (TYLENOL) tablet 650 mg   . insulin regular, human (MYXREDLIN) 100 units/ 100 mL infusion   . polyethylene glycol (MIRALAX / GLYCOLAX) packet 17 g   . ipratropium-albuterol (DUONEB) 0.5-2.5 (3) MG/3ML nebulizer solution 3 mL   . insulin glargine (LANTUS) injection 15 Units   . insulin aspart (novoLOG) injection 0-5 Units   . insulin aspart (novoLOG) injection 0-15 Units   . midazolam (VERSED) injection 2 mg   . fentaNYL (SUBLIMAZE) bolus via infusion 50 mcg   . midazolam (VERSED) injection 2 mg   .  MEDLINE mouth rinse   . meropenem (MERREM) 1 g in sodium chloride 0.9 % 100 mL IVPB   . pantoprazole (PROTONIX) injection 40 mg   . pantoprazole (PROTONIX) injection 40 mg   . fentaNYL (SUBLIMAZE) 2,500 mcg in sodium chloride 0.9 % 250 mL (10 mcg/mL) infusion   . heparin injection 5,000 Units   . 0.9 %  sodium chloride infusion   . polyethylene glycol (MIRALAX / GLYCOLAX) packet 17 g   . senna-docusate (Senokot-S) tablet 1 tablet   . polyethylene glycol (MIRALAX / GLYCOLAX) packet 17 g   . senna-docusate (Senokot-S) tablet 1 tablet   . feeding supplement (PRO-STAT SUGAR FREE 64) liquid 30 mL   . insulin aspart (novoLOG) injection 3-9 Units   . insulin aspart (novoLOG) injection 0-15 Units   . calcium gluconate inj 10% (1 g) URGENT USE ONLY!   . Chlorhexidine Gluconate Cloth 2 % PADS 6 each   . pantoprazole (PROTONIX) injection 40 mg   . insulin aspart (novoLOG) injection 3-9 Units   . phenylephrine (NEOSYNEPHRINE) 10-0.9 MG/250ML-% infusion   . dextrose 5 % bolus 250 mL   . insulin aspart (novoLOG) injection 3 Units   . free water 200 mL   . free water 200 mL   . free water 400 mL   . pantoprazole sodium (PROTONIX) 40 mg/20 mL oral suspension 40 mg   . polyethylene glycol (MIRALAX / GLYCOLAX) packet 17 g   . senna-docusate (Senokot-S) tablet 1 tablet   . MEDLINE mouth rinse   . free water 200 mL   . fentaNYL 2526mg in NS 2510m(1019mml) infusion-PREMIX   . fentaNYL  (SUBLIMAZE) bolus via infusion 50 mcg   . dexmedetomidine (PRECEDEX) 400 MCG/100ML (4 mcg/mL) infusion   . feeding supplement (VITAL AF 1.2 CAL) liquid 1,000 mL   . ipratropium-albuterol (DUONEB) 0.5-2.5 (3) MG/3ML nebulizer solution 3 mL   . LORazepam (ATIVAN) tablet 0-4 mg   . LORazepam (ATIVAN) tablet 0-4 mg   . LORazepam (ATIVAN) tablet 1-4 mg   . LORazepam (ATIVAN) injection 1-4 mg   . midazolam (VERSED) injection 2 mg P&T Policy: Duplicate PRN Therapy    Social History:  reports that he has never smoked. He has never used smokeless tobacco. He reports that he does not drink alcohol or use drugs.  Family History:   Family History  Problem Relation Age of Onset  . Cancer Mother        GYN problems    Review of systems not obtained due to patient factors. Weight change:   Intake/Output Summary (Last 24 hours) at 06/23/2019 1600 Last data filed at 06/23/2019 0930 Gross per 24 hour  Intake 1010 ml  Output --  Net 1010 ml   BP 117/79 (BP Location: Right Arm)   Pulse 88   Temp 98.2 F (36.8 C) (Axillary)   Resp 16   Ht 5' 6"  (1.676 m)   Wt 77 kg   SpO2 97%   BMI 27.40 kg/m  Vitals:   06/23/19 0430 06/23/19 0700 06/23/19 0930 06/23/19 1200  BP:   108/78 117/79  Pulse:  88    Resp:  16    Temp: 98.6 F (37 C)  98.4 F (36.9 C) 98.2 F (36.8 C)  TempSrc:   Axillary Axillary  SpO2:  97%    Weight:      Height:         General appearance: delirious Head: Normocephalic, without obvious abnormality, atraumatic Eyes: positive findings: icterus Resp: clear  to auscultation bilaterally Cardio: regular rate and rhythm, S1, S2 normal, no murmur, click, rub or gallop GI: distended, +BS, soft, NT Extremities: extremities normal, atraumatic, no cyanosis or edema  Labs: Basic Metabolic Panel: Recent Labs  Lab 06/17/19 0403 06/17/19 0403 06/18/19 0536 06/19/19 0445 06/20/19 0343 06/21/19 0853 06/22/19 0321 06/22/19 1046 06/23/19 0430  NA 144   < > 146* 153* 154*  154* 148* 150* 153*  K 3.7   < > 3.3* 3.5 3.5 4.2 4.1 4.6 4.8  CL 104   < > 103 105 109 112* 109 112* 113*  CO2 26   < > 28 31 30 29 26 24 27   GLUCOSE 161*   < > 270* 195* 167* 215* 102* 104* 136*  BUN 59*   < > 70* 62* 59* 53* 52* 53* 56*  CREATININE 2.49*   < > 2.37* 1.85* 1.71* 1.51* 1.86* 2.02* 2.35*  ALBUMIN 2.0*  --   --   --   --  2.0*  --  2.5* 2.2*  CALCIUM 9.1   < > 9.2 9.8 9.4 9.3 9.9 9.7 9.5  PHOS 3.5  --   --   --   --   --   --   --   --    < > = values in this interval not displayed.   Liver Function Tests: Recent Labs  Lab 06/21/19 0853 06/22/19 1046 06/23/19 0430  AST 94* 89* 79*  ALT 113* 99* 78*  ALKPHOS 865* 729* 759*  BILITOT 3.2* 4.3* 5.0*  PROT 6.5 7.1 6.6  ALBUMIN 2.0* 2.5* 2.2*   No results for input(s): LIPASE, AMYLASE in the last 168 hours. Recent Labs  Lab 06/22/19 1046 06/23/19 0430  AMMONIA 53* 40*   CBC: Recent Labs  Lab 06/21/19 0853 06/22/19 0321 06/23/19 0430 06/23/19 0953  WBC 15.3* 16.0* 15.3* 14.0*  NEUTROABS 12.3* 12.4* 11.8* 10.4*  HGB 9.5* 9.4* 8.2* 8.1*  HCT 31.5* 30.6* 26.3* 26.3*  MCV 96.3 93.3 92.3 94.3  PLT 354 392 379 403*   PT/INR: @LABRCNTIP (inr:5) Cardiac Enzymes: )No results for input(s): CKTOTAL, CKMB, CKMBINDEX, TROPONINI in the last 168 hours. CBG: Recent Labs  Lab 06/22/19 1819 06/23/19 0044 06/23/19 0408 06/23/19 0738 06/23/19 1209  GLUCAP 123* 100* 131* 125* 116*    Iron Studies:  Recent Labs  Lab 06/22/19 1614  IRON 88  TIBC 252  FERRITIN 918*    Xrays/Other Studies: [place] ASCITES (ABDOMEN LIMITED)  Result Date: 06/23/2019 CLINICAL DATA:  59 year old with increased abdominal girth. Evaluate for abdominal ascites. EXAM: LIMITED ABDOMEN ULTRASOUND FOR ASCITES TECHNIQUE: Limited ultrasound survey for ascites was performed in all four abdominal quadrants. COMPARISON:  06/22/2019 and CT 06/12/2019 FINDINGS: Markedly increased echogenicity in the visualized liver. No ascites identified.  IMPRESSION: 1. No ascites. 2. Increased echogenicity in the liver. Findings are suggestive for hepatic steatosis. Electronically Signed   By: Markus Daft M.D.   On: 06/23/2019 07:48   US Abdomen Limited RUQ  Result Date: 06/22/2019 CLINICAL DATA:  Elevated alkaline phosphatase. EXAM: ULTRASOUND ABDOMEN LIMITED RIGHT UPPER QUADRANT COMPARISON:  Noncontrast CT 06/12/2019 FINDINGS: Gallbladder: Gallstones on prior CT are not delineated currently. No gallbladder wall thickening or abnormal distention. No pericholecystic fluid. No sonographic Murphy sign noted by sonographer. Common bile duct: Diameter: 5 mm, normal. Liver: No focal lesion identified. Heterogeneously increased in parenchymal echogenicity. Suggestion of slight capsular nodularity. Portal vein is patent on color Doppler imaging with normal direction of blood flow towards the liver. Other: No  right upper quadrant ascites. IMPRESSION: 1. Increased hepatic echogenicity most consistent with hepatic steatosis. Suggestion of slight capsular nodularity raises possibility of cirrhosis. 2. Calcified gallstones on prior CT are not well delineated on the current exam. Electronically Signed   By: Keith Rake M.D.   On: 06/22/2019 04:32     Assessment/Plan: 1.  AKI/CKD stage 3- in setting of recent urosepsis now with decreased UOP, hypotension and worsening renal function.  No nephrotoxic agents noted.  Pt not able to drink due to AMS so intravascular volume depletion may play a role, however he has also been on abx so AIN is also on DDx as well as ischemic ATN due to relative hypotension. Will also check bladder scan to r/o neurogenic bladder and may need to repeat the renal US. 2. Acute metabolic encephalopathy- possible alcohol withdrawal but has been treated with benzos. 3. Abdominal distension- no ascites, likely due to intraperitoneal fat. 4. Hypernatremia- due to inability to drink.  Need to replete with D5W or D51/2 NS if his blood pressure  remains low and follow.  Could also place NGT and give free water boluses that way.  No evidence of volume overload on exam. 5. Diastolic CHF- stable volume 6. Anemia of critical illness- cont to follow 7. DM per primary.   Broadus John A Lisha Vitale 06/23/2019, 4:00 PM

## 2019-06-23 NOTE — Progress Notes (Signed)
Nutrition Follow-up  DOCUMENTATION CODES:   Not applicable  INTERVENTION:  Boost Breeze po TID, each supplement provides 250 kcal and 9 grams of protein  If PO intake remains poor, recommend pt have Cortrak placed. Consider:  -Osmolite 1.5 cal @ 79ml/hr (1338ml) -39ml Pro-stat BID   This would provide 2180 kcals, 112 grams of protein, 1073ml free water  NUTRITION DIAGNOSIS:   Inadequate oral intake related to lethargy/confusion as evidenced by meal completion < 50%.   GOAL:   Patient will meet greater than or equal to 90% of their needs  Progressing.  MONITOR:   PO intake, Supplement acceptance, Diet advancement, Weight trends  REASON FOR ASSESSMENT:   Ventilator, Consult Enteral/tube feeding initiation and management  ASSESSMENT:   59 yo male admitted with dysuria, low grade fever, elevated blood sugars. Developed increasing SOB and abdominal distention and was intubated 2/9. PMH includes DM, HTN, prostate cancer.  2/13 - Change in mental status, EEG showed profound diffuse encephalopathy without specific etiology 2/17 - extubated, OG removed, clear liquid diet ordered 2/18 - RRT, AMS, CIWA 17 2/19 - RRT, AMS, no interventions  Per MD, pt admitted to drinking a fifth of liquor a day; however, noted that pt is unlikely withdrawing as last drink was >10 days ago PTA.   Pt noted for severe sepsis 2/2 multidrug resistant E. Coli UTI, possible aspiration PNA development.   Pt unable to answer RD questions.   OT finishing with pt at time of RD visit and informed RD that pt spit up what he tried consuming from his clear liquid tray. RD messaged MD about SLP consult and consideration of Cortrak. Discussed pt with RN.   SLP recommending DYS1/thins with 1:1 assistance after swallow eval 2/19.   No PO intake documented.   Medications reviewed and include: Folvite, SSI, Novolog, Lantus, Chronulac, MVI, Miralax, Senokot-S, Thiamine  Labs reviewed: Na 153 (H) CBGs  100-125  NUTRITION - FOCUSED PHYSICAL EXAM:    Most Recent Value  Orbital Region  No depletion  Upper Arm Region  Mild depletion  Thoracic and Lumbar Region  No depletion  Buccal Region  No depletion  Temple Region  No depletion  Clavicle Bone Region  No depletion  Clavicle and Acromion Bone Region  No depletion  Scapular Bone Region  No depletion  Dorsal Hand  No depletion  Patellar Region  No depletion  Anterior Thigh Region  No depletion  Posterior Calf Region  No depletion  Edema (RD Assessment)  Mild  Hair  Reviewed  Eyes  Reviewed  Mouth  Reviewed  Skin  Reviewed  Nails  Reviewed       Diet Order:   Diet Order            Diet clear liquid Room service appropriate? Yes; Fluid consistency: Thin  Diet effective now              EDUCATION NEEDS:   Not appropriate for education at this time  Skin:  Skin Assessment: Reviewed RN Assessment  Last BM:  2/18  Height:   Ht Readings from Last 1 Encounters:  06/13/19 5\' 6"  (1.676 m)    Weight:   Wt Readings from Last 1 Encounters:  06/21/19 77 kg    BMI:  Body mass index is 27.4 kg/m.  Estimated Nutritional Needs:   Kcal:  1950-2150  Protein:  110-130 gm  Fluid:  >/= 2 L    Larkin Ina, MS, RD, LDN RD pager number and weekend/on-call pager number  located in [place].

## 2019-06-24 ENCOUNTER — Encounter (HOSPITAL_COMMUNITY): Payer: Self-pay | Admitting: Radiology

## 2019-06-24 ENCOUNTER — Inpatient Hospital Stay (HOSPITAL_COMMUNITY): Payer: Medicare Other

## 2019-06-24 DIAGNOSIS — G9341 Metabolic encephalopathy: Secondary | ICD-10-CM

## 2019-06-24 LAB — CBC WITH DIFFERENTIAL/PLATELET
Abs Immature Granulocytes: 0.06 10*3/uL (ref 0.00–0.07)
Basophils Absolute: 0.1 10*3/uL (ref 0.0–0.1)
Basophils Relative: 1 %
Eosinophils Absolute: 0.4 10*3/uL (ref 0.0–0.5)
Eosinophils Relative: 4 %
HCT: 27.6 % — ABNORMAL LOW (ref 39.0–52.0)
Hemoglobin: 8.6 g/dL — ABNORMAL LOW (ref 13.0–17.0)
Immature Granulocytes: 1 %
Lymphocytes Relative: 18 %
Lymphs Abs: 1.9 10*3/uL (ref 0.7–4.0)
MCH: 29.3 pg (ref 26.0–34.0)
MCHC: 31.2 g/dL (ref 30.0–36.0)
MCV: 93.9 fL (ref 80.0–100.0)
Monocytes Absolute: 0.5 10*3/uL (ref 0.1–1.0)
Monocytes Relative: 5 %
Neutro Abs: 7.6 10*3/uL (ref 1.7–7.7)
Neutrophils Relative %: 71 %
Platelets: 424 10*3/uL — ABNORMAL HIGH (ref 150–400)
RBC: 2.94 MIL/uL — ABNORMAL LOW (ref 4.22–5.81)
RDW: 15.6 % — ABNORMAL HIGH (ref 11.5–15.5)
WBC: 10.5 10*3/uL (ref 4.0–10.5)
nRBC: 0.2 % (ref 0.0–0.2)

## 2019-06-24 LAB — COMPREHENSIVE METABOLIC PANEL
ALT: 70 U/L — ABNORMAL HIGH (ref 0–44)
AST: 99 U/L — ABNORMAL HIGH (ref 15–41)
Albumin: 2.2 g/dL — ABNORMAL LOW (ref 3.5–5.0)
Alkaline Phosphatase: 729 U/L — ABNORMAL HIGH (ref 38–126)
Anion gap: 14 (ref 5–15)
BUN: 41 mg/dL — ABNORMAL HIGH (ref 8–23)
CO2: 26 mmol/L (ref 22–32)
Calcium: 9.3 mg/dL (ref 8.9–10.3)
Chloride: 112 mmol/L — ABNORMAL HIGH (ref 98–111)
Creatinine, Ser: 2.05 mg/dL — ABNORMAL HIGH (ref 0.61–1.24)
GFR calc Af Amer: 39 mL/min — ABNORMAL LOW (ref >60)
GFR calc non Af Amer: 34 mL/min — ABNORMAL LOW (ref >60)
Glucose, Bld: 100 mg/dL — ABNORMAL HIGH (ref 70–99)
Potassium: 5.8 mmol/L — ABNORMAL HIGH (ref 3.5–5.1)
Sodium: 152 mmol/L — ABNORMAL HIGH (ref 135–145)
Total Bilirubin: 4.9 mg/dL — ABNORMAL HIGH (ref 0.3–1.2)
Total Protein: 6.7 g/dL (ref 6.5–8.1)

## 2019-06-24 LAB — CULTURE, RESPIRATORY W GRAM STAIN: Special Requests: NORMAL

## 2019-06-24 LAB — PHOSPHORUS: Phosphorus: 4.7 mg/dL — ABNORMAL HIGH (ref 2.5–4.6)

## 2019-06-24 LAB — BASIC METABOLIC PANEL
Anion gap: 14 (ref 5–15)
BUN: 41 mg/dL — ABNORMAL HIGH (ref 8–23)
CO2: 24 mmol/L (ref 22–32)
Calcium: 9.9 mg/dL (ref 8.9–10.3)
Chloride: 114 mmol/L — ABNORMAL HIGH (ref 98–111)
Creatinine, Ser: 2 mg/dL — ABNORMAL HIGH (ref 0.61–1.24)
GFR calc Af Amer: 41 mL/min — ABNORMAL LOW (ref >60)
GFR calc non Af Amer: 35 mL/min — ABNORMAL LOW (ref >60)
Glucose, Bld: 114 mg/dL — ABNORMAL HIGH (ref 70–99)
Potassium: 4.1 mmol/L (ref 3.5–5.1)
Sodium: 152 mmol/L — ABNORMAL HIGH (ref 135–145)

## 2019-06-24 LAB — GLUCOSE, CAPILLARY
Glucose-Capillary: 100 mg/dL — ABNORMAL HIGH (ref 70–99)
Glucose-Capillary: 103 mg/dL — ABNORMAL HIGH (ref 70–99)
Glucose-Capillary: 94 mg/dL (ref 70–99)
Glucose-Capillary: 77 mg/dL (ref 70–99)
Glucose-Capillary: 68 mg/dL — ABNORMAL LOW (ref 70–99)
Glucose-Capillary: 127 mg/dL — ABNORMAL HIGH (ref 70–99)

## 2019-06-24 LAB — HAPTOGLOBIN: Haptoglobin: 283 mg/dL (ref 32–363)

## 2019-06-24 LAB — GAMMA GT: GGT: 653 U/L — ABNORMAL HIGH (ref 7–50)

## 2019-06-24 LAB — LACTIC ACID, PLASMA
Lactic Acid, Venous: 1 mmol/L (ref 0.5–1.9)
Lactic Acid, Venous: 1.2 mmol/L (ref 0.5–1.9)

## 2019-06-24 LAB — MAGNESIUM: Magnesium: 2.4 mg/dL (ref 1.7–2.4)

## 2019-06-24 MED ORDER — LORAZEPAM 2 MG/ML IJ SOLN
1.0000 mg | INTRAMUSCULAR | Status: DC | PRN
  Administered 2019-06-24 – 2019-06-25 (×4): 2 mg via INTRAVENOUS
  Filled 2019-06-24 (×4): qty 1

## 2019-06-24 MED ORDER — LORAZEPAM 1 MG PO TABS: 1.0000 mg | ORAL_TABLET | ORAL | Status: DC | PRN

## 2019-06-24 MED ORDER — SODIUM ZIRCONIUM CYCLOSILICATE 10 G PO PACK
10.0000 g | PACK | Freq: Two times a day (BID) | ORAL | Status: DC
  Administered 2019-06-24 (×2): 10 g via ORAL
  Filled 2019-06-24 (×3): qty 1

## 2019-06-24 NOTE — Progress Notes (Addendum)
PROGRESS NOTE    Scott Mcgee    Code Status: Full Code  PPI:[id number] DOB: 1960/09/17 DOA: 06/12/2019 LOS: 12 days  PCP: [facility]:  Chief Complaint  Patient presents with  . [facility] Summary  This is a 59 year old male with past medical history of diabetes, hypertension, prostate cancer who was admitted on 2/8 with dysuria, low-grade fevers for several days as well as dysuria x2 weeks.  To have hyperglycemia, hyponatremia and elevated creatinine ED UA positive for UTI.  CT abdomen/pelvis with stranding around kidneys without other abnormalities.  Started on insulin drip and IV fluids.  PCCM consulted on 2/9 for acute hypoxemic respiratory failure and in respiratory distress and patient was subsequently intubated.  Meropenem changed to ceftriaxone on 2/10.  Change in mental status on 2/13 noted by PCCM with bradycardia and EEG was ordered and patient was further sedated.  EEG showed profound diffuse encephalopathy without specific etiology and no seizures or epileptiform discharges.  Patient was successfully extubated on 2/17.  Patient transferred to progressive floor and TRH resumed care 2/18.  2/18: Noted to have CIWA 17 early a.m. with frequent Ativan administration per CIWA protocol. Diaphoresis, Rigors noted with abdominal distention.  Ammonia level minimally elevated.  Started on lactulose.  Also started on D5W for hypernatremia and free water deficit.  Foley catheter placed due to urinary retention.  Repeat blood cultures ordered and US guided paracentesis ordered.  2/19: Nephrology consulted for worsening renal failure 2/20: Gi consulted for persistent elevated Alk Phos and GGT. MRI brain. Void trial  A & P   Active Problems:   Type 2 diabetes mellitus with hyperosmolar hyperglycemic state (HHS) ([place])   Hyponatremia   HTN (hypertension)   Acute renal failure (HCC)   Abdominal distention   Alcohol use   Sepsis ([place])   Acute  pyelonephritis   Acute respiratory failure ([place])   1. Severe sepsis unknown source with recent multidrug resistant E. coli UTI, possible aspiration pneumonia development vs. GI source? a. 2 days meropenem followed by 7 days ceftriaxone in ICU (completed 2/16). Started on Cefepime 2/19 due to sepsis unknown source b. Now afebrile with resolved leukocytosis c. MRSA nares negative d. CXR 2/17 with slight worsening patchy opacity in right lung base e. Repeat blood cultures, no growth to date f. Procalcitonin 1.9 2. Acute metabolic encephalopathy, likely multifactorial: Sepsis, hypernatremia, AKI, hepatic encephalopathy, hypoxemia a. Oriented x 1 to year b. Hallucinations overnight c. CT brain negative for acute abnormality d. Continue to treat underlying conditions e. EEG unremarkable for seizure f. MRI brain today since he is less tremulous 3. Hypernatremia due to poor p.o. intake a. Has worsened overnight b. Continue D5W c. Recheck this evening 4. 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 intake, sepsis, acute on chronic heart failure and hemodynamic changes and concern for ischemic ATN a. Initially improved, then Cr worsened again. Nephrology was consulted 2/19 b. Vasculitic workup per nephro c. Had mild hypercalcemia (corrected), anemia, AKI, high normal TP and low albumin. Checking Myeloma workup 5. Hyperkalemia a. Lokelma per nephro and recheck 6. Alcohol abuse a. Last drink was 10+ days ago (prior to admission), unlikely withdrawing at this point b. Decrease frequency of Ativan and taper off to prevent withdrawal seizure 7. Abdominal distention a. This is patient's normal body habitus (confirmed by radiology from CT scan and [place]) b. Continue bowel regimen 8. Elevated LFTs a. Elevated ammonia  improved with lactulose b. Alk phos 700+, GGT 653 c. AST/ALT improved d. RUQ ultrasound with hepatic steatosis, suspected cirrhosis and without  cholelithiasis e. T bili elevated 3.2-> 4.3->5.0 f. Ultrasound-guided paracentesis ordered, no ascites noted g. GI consulted, appreciate recommendations. AMA level? ERCP? MRCP? 9. Acute hypoxemic respiratory failure, concern for volume overload as well as possible aspiration pneumonia a. Procalcitonin 1.9 b. Day 2/TBD cefepime c. Daily weights and strict I's and O's with Foley catheter 10. Mild urinary retention, unknown etiology a. Foley catheter placed 2/18, void trial today b. Bladder scan and straight cath protocol 11. HFpEF a. Stable volume today b. I/os and Daily weights 12. Normocytic anemia, suspect AoCD a. Differential unremarkable b. LDH and Haptoglobin normal 13. Diabetes a. Continue current regimen   DVT prophylaxis: Subcu heparin Family Communication: wife updated today Disposition Plan:   Patient came from:  Home                                                                                          Anticipated d/c place: SNF  Barriers to d/c: Medical stability  Pressure injury documentation    None  Consultants  PCCM  Procedures  ETT 2/9-2/17 CT abd  2/8: Cholelithiasis without complicating factors. Mild perinephric stranding is noted on the left without obstructive change. Underlying UTI could not be totally excluded. Mild bibasilar atelectasis. 2/9 echo: LVEF 60-65%, LVH 2/13 EEG:   Antibiotics   Anti-infectives (From admission, onward)   Start     Dose/Rate Route Frequency Ordered Stop   06/23/19 1900  ceFEPIme (MAXIPIME) 2 g in sodium chloride 0.9 % 100 mL IVPB     2 g 200 mL/hr over 30 Minutes Intravenous Every 12 hours 06/23/19 1815     06/14/19 1830  cefTRIAXone (ROCEPHIN) 2 g in sodium chloride 0.9 % 100 mL IVPB     2 g 200 mL/hr over 30 Minutes Intravenous Every 24 hours 06/14/19 1020 06/20/19 1803   06/12/19 1830  meropenem (MERREM) 1 g in sodium chloride 0.9 % 100 mL IVPB  Status:  Discontinued     1 g 200 mL/hr over 30 Minutes  Intravenous Every 12 hours 06/12/19 1818 06/14/19 1020   06/12/19 1745  cefTRIAXone (ROCEPHIN) 2 g in sodium chloride 0.9 % 100 mL IVPB  Status:  Discontinued     2 g 200 mL/hr over 30 Minutes Intravenous Every 24 hours 06/12/19 1743 06/12/19 1814        Subjective   Overnight hospitalist notified about fine tremors, stiff body, eyes rolling back and just grunting. Later reported visual hallucinations, trying to get out of bed.   Patient less tremulous now, he is unable to provide history.  Objective   Vitals:   06/24/19 0410 06/24/19 0736 06/24/19 0755 06/24/19 0800  BP: 106/74  98/71   Pulse: 89  93 89  Resp: (!) 26  (!) 22 18  Temp: 98.4 F (36.9 C)  97.8 F (36.6 C)   TempSrc:   Axillary   SpO2: 93% 98% 96% 98%  Weight: 75.2 kg     Height:        Intake/Output Summary (Last  24 hours) at 06/24/2019 1208 Last data filed at 06/24/2019 1000 Gross per 24 hour  Intake 824.66 ml  Output 1350 ml  Net -525.34 ml   Filed Weights   06/20/19 0404 06/21/19 0456 06/24/19 0410  Weight: 75.6 kg 77 kg 75.2 kg    Examination:  Physical Exam Vitals and nursing note reviewed. Exam conducted with a chaperone present.  Constitutional:      General: He is not in acute distress.    Appearance: He is toxic-appearing.     Comments: awake  Cardiovascular:     Rate and Rhythm: Tachycardia present.  Pulmonary:     Effort: No respiratory distress.     Breath sounds: No wheezing.  Abdominal:     General: There is distension.  Musculoskeletal:        General: No swelling or tenderness.  Neurological:     Mental Status: He is disoriented.     Comments: Oriented x 1     Data Reviewed: I have personally reviewed following labs and imaging studies  CBC: Recent Labs  Lab 06/21/19 0853 06/22/19 0321 06/23/19 0430 06/23/19 0953 06/24/19 0438  WBC 15.3* 16.0* 15.3* 14.0* 10.5  NEUTROABS 12.3* 12.4* 11.8* 10.4* 7.6  HGB 9.5* 9.4* 8.2* 8.1* 8.6*  HCT 31.5* 30.6* 26.3* 26.3*  27.6*  MCV 96.3 93.3 92.3 94.3 93.9  PLT 354 392 379 403* 280*   Basic Metabolic Panel: Recent Labs  Lab 06/18/19 0536 06/18/19 0536 06/19/19 0445 06/20/19 0343 06/21/19 0853 06/22/19 0321 06/22/19 1046 06/23/19 0430 06/24/19 0416  NA 146*   < > 153*   < > 154* 148* 150* 153* 152*  K 3.3*   < > 3.5   < > 4.2 4.1 4.6 4.8 5.8*  CL 103   < > 105   < > 112* 109 112* 113* 112*  CO2 28   < > 31   < > 29 26 24 27 26   GLUCOSE 270*   < > 195*   < > 215* 102* 104* 136* 100*  BUN 70*   < > 62*   < > 53* 52* 53* 56* 41*  CREATININE 2.37*   < > 1.85*   < > 1.51* 1.86* 2.02* 2.35* 2.05*  CALCIUM 9.2   < > 9.8   < > 9.3 9.9 9.7 9.5 9.3  MG 2.0  --  2.1  --   --   --   --  2.4  --    < > = values in this interval not displayed.   GFR: Estimated Creatinine Clearance: 34.1 mL/min (A) (by C-G formula based on SCr of 2.05 mg/dL (H)). Liver Function Tests: Recent Labs  Lab 06/21/19 0853 06/22/19 1046 06/23/19 0430 06/24/19 0416  AST 94* 89* 79* 99*  ALT 113* 99* 78* 70*  ALKPHOS 865* 729* 759* 729*  BILITOT 3.2* 4.3* 5.0* 4.9*  PROT 6.5 7.1 6.6 6.7  ALBUMIN 2.0* 2.5* 2.2* 2.2*   No results for input(s): LIPASE, AMYLASE in the last 168 hours. Recent Labs  Lab 06/22/19 1046 06/23/19 0430  AMMONIA 53* 40*   Coagulation Profile: No results for input(s): INR, PROTIME in the last 168 hours. Cardiac Enzymes: No results for input(s): CKTOTAL, CKMB, CKMBINDEX, TROPONINI in the last 168 hours. BNP (last 3 results) No results for input(s): PROBNP in the last 8760 hours. HbA1C: No results for input(s): HGBA1C in the last 72 hours. CBG: Recent Labs  Lab 06/23/19 1652 06/23/19 2049 06/23/19 2335 06/24/19 0349 06/24/19 1791  GLUCAP 66* 108* 85 100* 103*   Lipid Profile: No results for input(s): CHOL, HDL, LDLCALC, TRIG, CHOLHDL, LDLDIRECT in the last 72 hours. Thyroid Function Tests: No results for input(s): TSH, T4TOTAL, FREET4, T3FREE, THYROIDAB in the last 72 hours. Anemia  Panel: Recent Labs    06/22/19 1614  FERRITIN 918*  TIBC 252  IRON 88   Sepsis Labs: Recent Labs  Lab 06/22/19 1614 06/24/19 0437  PROCALCITON 1.90  --   LATICACIDVEN  --  1.0    Recent Results (from the past 240 hour(s))  Culture, respiratory     Status: None   Collection Time: 06/15/19  8:39 AM   Specimen: Tracheal Aspirate; Respiratory  Result Value Ref Range Status   Specimen Description TRACHEAL ASPIRATE  Final   Special Requests NONE  Final   Gram Stain   Final    RARE WBC PRESENT,BOTH PMN AND MONONUCLEAR NO ORGANISMS SEEN    Culture   Final    NO GROWTH 2 DAYS Performed at [facility], [address] [place], [place] [address]    Report Status 06/17/2019 FINAL  Final  Culture, respiratory (non-expectorated)     Status: None   Collection Time: 06/21/19  9:37 AM   Specimen: Tracheal Aspirate; Respiratory  Result Value Ref Range Status   Specimen Description TRACHEAL ASPIRATE  Final   Special Requests Normal  Final   Gram Stain   Final    ABUNDANT WBC PRESENT, PREDOMINANTLY PMN NO ORGANISMS SEEN Performed at [facility], [address] [place], [place] [address]    Culture RARE CANDIDA ALBICANS  Final   Report Status 06/24/2019 FINAL  Final  Culture, blood (routine x 2)     Status: None (Preliminary result)   Collection Time: 06/22/19 10:42 AM   Specimen: BLOOD  Result Value Ref Range Status   Specimen Description BLOOD LEFT ANTECUBITAL  Final   Special Requests   Final    BOTTLES DRAWN AEROBIC AND ANAEROBIC Blood Culture results may not be optimal due to an excessive volume of blood received in culture bottles Performed at [facility] [address], [place], [place] [address]    Culture NO GROWTH 2 DAYS  Final   Report Status PENDING  Incomplete  Culture, blood (routine x 2)     Status: None (Preliminary result)   Collection Time: 06/22/19 10:47 AM   Specimen: BLOOD RIGHT HAND  Result Value Ref Range Status   Specimen  Description BLOOD RIGHT HAND  Final   Special Requests   Final    BOTTLES DRAWN AEROBIC AND ANAEROBIC Blood Culture results may not be optimal due to an excessive volume of blood received in culture bottles Performed at [facility], [place] [address] [place], [place] [address]    Culture NO GROWTH 2 DAYS  Final   Report Status PENDING  Incomplete         Radiology Studies: DG CHEST PORT 1 VIEW  Result Date: 06/23/2019 CLINICAL DATA:  59 year old male with cough and dyspnea. EXAM: PORTABLE CHEST 1 VIEW COMPARISON:  Chest radiograph dated 06/21/2019. FINDINGS: Interval removal of the endotracheal, and enteric tubes as well as removal of the right IJ central venous line. There is shallow inspiration with bibasilar atelectasis. Pneumonia is not excluded. Faint densities in the right mid lung field appears similar to prior radiograph. Patchy area of density at the left lung base may represent atelectasis or infiltrate. A small left pleural effusion may be  present. No pneumothorax. Stable cardiac silhouette. No acute osseous pathology. IMPRESSION: 1. Interval removal of the support apparatus. 2. Shallow inspiration with bibasilar densities similar to prior radiograph which may represent atelectasis or infiltrate. Electronically Signed   By: Anner Crete M.D.   On: 06/23/2019 19:39   [place] ASCITES (ABDOMEN LIMITED)  Result Date: 06/23/2019 CLINICAL DATA:  59 year old with increased abdominal girth. Evaluate for abdominal ascites. EXAM: LIMITED ABDOMEN ULTRASOUND FOR ASCITES TECHNIQUE: Limited ultrasound survey for ascites was performed in all four abdominal quadrants. COMPARISON:  06/22/2019 and CT 06/12/2019 FINDINGS: Markedly increased echogenicity in the visualized liver. No ascites identified. IMPRESSION: 1. No ascites. 2. Increased echogenicity in the liver. Findings are suggestive for hepatic steatosis. Electronically Signed   By: Markus Daft M.D.   On: 06/23/2019 07:48         Scheduled Meds: . chlorhexidine gluconate (MEDLINE KIT)  15 mL Mouth Rinse BID  . Chlorhexidine Gluconate Cloth  6 each Topical Daily  . feeding supplement  1 Container Oral TID BM  . folic acid  1 mg Oral Daily  . Gerhardt's butt cream   Topical BID  . heparin injection (subcutaneous)  5,000 Units Subcutaneous Q8H  . insulin aspart  0-15 Units Subcutaneous Q4H  . insulin aspart  6 Units Subcutaneous Q4H  . insulin glargine  15 Units Subcutaneous Daily  . lactulose  10 g Oral Daily  . mouth rinse  15 mL Mouth Rinse BID  . multivitamin with minerals  1 tablet Oral Daily  . polyethylene glycol  17 g Oral Daily  . QUEtiapine  50 mg Oral QHS  . senna-docusate  1 tablet Oral BID  . sodium zirconium cyclosilicate  10 g Oral BID  . thiamine  100 mg Oral Daily   Or  . thiamine  100 mg Intravenous Daily   Continuous Infusions: . sodium chloride 10 mL/hr at 06/21/19 1100  . ceFEPime (MAXIPIME) IV 2 g (06/24/19 0926)  . dextrose 50 mL/hr at 06/23/19 0930     Time spent: 36 minutes with over 50% of the time coordinating the patient's care    Harold Hedge, DO [facility] Pager [phone]  Call night coverage person covering after 7pm

## 2019-06-24 NOTE — Progress Notes (Signed)
Patient ID: Scott Mcgee, male   DOB: 1960/11/24, 59 y.o.   MRN: [id number] S: pt continues to be delirious and is getting ready to go to MRI.  Foley catheter removed this morning and now with condom cath O:BP 98/71 (BP Location: Right Arm)   Pulse 89   Temp 97.8 F (36.6 C) (Axillary)   Resp 18   Ht 5' 6"  (1.676 m)   Wt 75.2 kg   SpO2 98%   BMI 26.76 kg/m   Intake/Output Summary (Last 24 hours) at 06/24/2019 1044 Last data filed at 06/24/2019 0500 Gross per 24 hour  Intake 724.66 ml  Output 1350 ml  Net -625.34 ml   Intake/Output: I/O last 3 completed shifts: In: 1734.7 [P.O.:150; I.V.:434.7; Other:950; IV Piggyback:200] Out: 1350 [[id number]]  Intake/Output this shift:  No intake/output data recorded. Weight change:  Gen: delirious and not following commands CVS: no rub Resp: cta Abd: distended, +BS, soft, NT Ext: no edema  Recent Labs  Lab 06/19/19 0445 06/20/19 0343 06/21/19 0853 06/22/19 0321 06/22/19 1046 06/23/19 0430 06/24/19 0416  NA 153* 154* 154* 148* 150* 153* 152*  K 3.5 3.5 4.2 4.1 4.6 4.8 5.8*  CL 105 109 112* 109 112* 113* 112*  CO2 31 30 29 26 24 27 26   GLUCOSE 195* 167* 215* 102* 104* 136* 100*  BUN 62* 59* 53* 52* 53* 56* 41*  CREATININE 1.85* 1.71* 1.51* 1.86* 2.02* 2.35* 2.05*  ALBUMIN  --   --  2.0*  --  2.5* 2.2* 2.2*  CALCIUM 9.8 9.4 9.3 9.9 9.7 9.5 9.3  AST  --   --  94*  --  89* 79* 99*  ALT  --   --  113*  --  99* 78* 70*   Liver Function Tests: Recent Labs  Lab 06/22/19 1046 06/23/19 0430 06/24/19 0416  AST 89* 79* 99*  ALT 99* 78* 70*  ALKPHOS 729* 759* 729*  BILITOT 4.3* 5.0* 4.9*  PROT 7.1 6.6 6.7  ALBUMIN 2.5* 2.2* 2.2*   No results for input(s): LIPASE, AMYLASE in the last 168 hours. Recent Labs  Lab 06/22/19 1046 06/23/19 0430  AMMONIA 53* 40*   CBC: Recent Labs  Lab 06/21/19 0853 06/21/19 0853 06/22/19 0321 06/22/19 0321 06/23/19 0430 06/23/19 0953 06/24/19 0438  WBC 15.3*   < > 16.0*   < > 15.3*  14.0* 10.5  NEUTROABS 12.3*   < > 12.4*   < > 11.8* 10.4* 7.6  HGB 9.5*   < > 9.4*   < > 8.2* 8.1* 8.6*  HCT 31.5*   < > 30.6*   < > 26.3* 26.3* 27.6*  MCV 96.3  --  93.3  --  92.3 94.3 93.9  PLT 354   < > 392   < > 379 403* 424*   < > = values in this interval not displayed.   Cardiac Enzymes: No results for input(s): CKTOTAL, CKMB, CKMBINDEX, TROPONINI in the last 168 hours. CBG: Recent Labs  Lab 06/23/19 1652 06/23/19 2049 06/23/19 2335 06/24/19 0356 06/24/19 0758  GLUCAP 66* 108* 85 100* 103*    Iron Studies:  Recent Labs    06/22/19 1614  IRON 88  TIBC 252  FERRITIN 918*   Studies/Results: DG CHEST PORT 1 VIEW  Result Date: 06/23/2019 CLINICAL DATA:  59 year old male with cough and dyspnea. EXAM: PORTABLE CHEST 1 VIEW COMPARISON:  Chest radiograph dated 06/21/2019. FINDINGS: Interval removal of the endotracheal, and enteric tubes as well as removal of the  right IJ central venous line. There is shallow inspiration with bibasilar atelectasis. Pneumonia is not excluded. Faint densities in the right mid lung field appears similar to prior radiograph. Patchy area of density at the left lung base may represent atelectasis or infiltrate. A small left pleural effusion may be present. No pneumothorax. Stable cardiac silhouette. No acute osseous pathology. IMPRESSION: 1. Interval removal of the support apparatus. 2. Shallow inspiration with bibasilar densities similar to prior radiograph which may represent atelectasis or infiltrate. Electronically Signed   By: Anner Crete M.D.   On: 06/23/2019 19:39   [place] ASCITES (ABDOMEN LIMITED)  Result Date: 06/23/2019 CLINICAL DATA:  59 year old with increased abdominal girth. Evaluate for abdominal ascites. EXAM: LIMITED ABDOMEN ULTRASOUND FOR ASCITES TECHNIQUE: Limited ultrasound survey for ascites was performed in all four abdominal quadrants. COMPARISON:  06/22/2019 and CT 06/12/2019 FINDINGS: Markedly increased echogenicity in the  visualized liver. No ascites identified. IMPRESSION: 1. No ascites. 2. Increased echogenicity in the liver. Findings are suggestive for hepatic steatosis. Electronically Signed   By: Markus Daft M.D.   On: 06/23/2019 07:48   . chlorhexidine gluconate (MEDLINE KIT)  15 mL Mouth Rinse BID  . Chlorhexidine Gluconate Cloth  6 each Topical Daily  . feeding supplement  1 Container Oral TID BM  . folic acid  1 mg Oral Daily  . Gerhardt's butt cream   Topical BID  . heparin injection (subcutaneous)  5,000 Units Subcutaneous Q8H  . insulin aspart  0-15 Units Subcutaneous Q4H  . insulin aspart  6 Units Subcutaneous Q4H  . insulin glargine  15 Units Subcutaneous Daily  . lactulose  10 g Oral Daily  . mouth rinse  15 mL Mouth Rinse BID  . multivitamin with minerals  1 tablet Oral Daily  . polyethylene glycol  17 g Oral Daily  . QUEtiapine  50 mg Oral QHS  . senna-docusate  1 tablet Oral BID  . sodium zirconium cyclosilicate  10 g Oral BID  . thiamine  100 mg Oral Daily   Or  . thiamine  100 mg Intravenous Daily    BMET    Component Value Date/Time   NA 152 (H) 06/24/2019 0416   K 5.8 (H) 06/24/2019 0416   CL 112 (H) 06/24/2019 0416   CO2 26 06/24/2019 0416   GLUCOSE 100 (H) 06/24/2019 0416   BUN 41 (H) 06/24/2019 0416   CREATININE 2.05 (H) 06/24/2019 0416   CALCIUM 9.3 06/24/2019 0416   GFRNONAA 34 (L) 06/24/2019 0416   GFRAA 39 (L) 06/24/2019 0416   CBC    Component Value Date/Time   WBC 10.5 06/24/2019 0438   RBC 2.94 (L) 06/24/2019 0438   HGB 8.6 (L) 06/24/2019 0438   HCT 27.6 (L) 06/24/2019 0438   PLT 424 (H) 06/24/2019 0438   MCV 93.9 06/24/2019 0438   MCH 29.3 06/24/2019 0438   MCHC 31.2 06/24/2019 0438   RDW 15.6 (H) 06/24/2019 0438   LYMPHSABS 1.9 06/24/2019 0438   MONOABS 0.5 06/24/2019 0438   EOSABS 0.4 06/24/2019 0438   BASOSABS 0.1 06/24/2019 0438    Assessment/Plan: 1.  AKI/CKD stage 3- in setting of recent urosepsis now with decreased UOP, hypotension and  worsening renal function.  No nephrotoxic agents noted.  Pt not able to drink due to AMS so intravascular volume depletion may play a role, however he has also been on abx so AIN is also on DDx as well as ischemic ATN due to relative hypotension.  1. UA with blood,  protein, and leukocytes.  Given AMS and recurrent AKI will start acute GN/vasculitis workup. 2. Improved UOP overnight with drop in Cr but now with hyperkalemia 2. Hyperkalemia- started lokelma 10 gm bid and follow.  Will recheck later today. 3. Acute metabolic encephalopathy- possible alcohol withdrawal but has been treated with benzos. 4. Abnormal LFT's- elevated GGT and alk phos.  Agree with GI evaluation  5. Abdominal distension- no ascites, likely due to intraperitoneal fat. 6. Hypernatremia- due to inability to drink.  Need to replete with D5W or D51/2 NS if his blood pressure remains low and follow.  Could also place NGT and give free water boluses that way.  No evidence of volume overload on exam. 7. Diastolic CHF- stable volume 8. Anemia of critical illness- cont to follow 9. DM per primary.   Donetta Potts, MD [employer] [phone]

## 2019-06-24 NOTE — Consult Note (Signed)
UNASSIGNED CONSULT  Reason for Consult: Elevated AP and AMS Referring Physician: [facility]  PETERSON MATHEY HPI: This is a 59 year old male with a PMH of DM and prostate cancer admitted for complaints of hyperglycemia, intermittent fevers, and dysuria.  He was identified to have an acute pyelonephritis and treatment was initiated, but he started to have a rapid decline.  He developed acute respiratory failure and an acute encephalopathy.  Over the course of his hospitalization he was noted to have an increasing AP.  The highest value peaked at 998 and his GGT was confirmed to be elevated.  A RUQ U/S was negative for any evidence of biliary ductal dilation, but he does have cholelithiasis and the suggestion of cirrhosis.  He has a history of ETOH abuse and he is consuming a significant amount of Ativan to control withdrawal symptoms.  His renal function was noted to be declining and his BNP was elevated at 295.  An echocardiogram shows an EF of 60-65%.    Past Medical History:  Diagnosis Date  . Bipolar 1 disorder ([place])    NO CURRENT MEDS  . Diabetes mellitus without complication ([place])    type 2   . Elevated cholesterol   . Glaucoma    LEFT EYE  . Hypertension   . Prostate cancer [facility])     Past Surgical History:  Procedure Laterality Date  . LYMPHADENECTOMY Bilateral 06/16/2017   Procedure: LYMPHADENECTOMY;  Surgeon: Alexis Frock, MD;  Location: [facility];  Service: Urology;  Laterality: Bilateral;  . PROSTATE BIOPSY    . ROBOT ASSISTED LAPAROSCOPIC RADICAL PROSTATECTOMY N/A 06/16/2017   Procedure: ROBOTIC ASSISTED LAPAROSCOPIC RADICAL PROSTATECTOMY;  Surgeon: Alexis Frock, MD;  Location: [facility];  Service: Urology;  Laterality: N/A;    Family History  Problem Relation Age of Onset  . Cancer Mother        GYN problems    Social History:  reports that he has never smoked. He has never used smokeless tobacco. He reports that he does not drink alcohol or use  drugs.  Allergies:  Allergies  Allergen Reactions  . Clotrimazole 3 [Clotrimazole] Hives    cream    Medications:  Scheduled: . chlorhexidine gluconate (MEDLINE KIT)  15 mL Mouth Rinse BID  . Chlorhexidine Gluconate Cloth  6 each Topical Daily  . feeding supplement  1 Container Oral TID BM  . folic acid  1 mg Oral Daily  . Gerhardt's butt cream   Topical BID  . heparin injection (subcutaneous)  5,000 Units Subcutaneous Q8H  . insulin aspart  0-15 Units Subcutaneous Q4H  . insulin aspart  6 Units Subcutaneous Q4H  . insulin glargine  15 Units Subcutaneous Daily  . lactulose  10 g Oral Daily  . mouth rinse  15 mL Mouth Rinse BID  . multivitamin with minerals  1 tablet Oral Daily  . polyethylene glycol  17 g Oral Daily  . QUEtiapine  50 mg Oral QHS  . senna-docusate  1 tablet Oral BID  . sodium zirconium cyclosilicate  10 g Oral BID  . thiamine  100 mg Oral Daily   Or  . thiamine  100 mg Intravenous Daily   Continuous: . sodium chloride 10 mL/hr at 06/21/19 1100  . ceFEPime (MAXIPIME) IV 2 g (06/24/19 0926)  . dextrose 50 mL/hr at 06/23/19 0930    Results for orders placed or performed during the hospital encounter of 06/12/19 (from the past 24 hour(s))  Glucose, capillary  Status: Abnormal   Collection Time: 06/23/19  4:52 PM  Result Value Ref Range   Glucose-Capillary 66 (L) 70 - 99 mg/dL  Sodium, urine, random     Status: None   Collection Time: 06/23/19  6:03 PM  Result Value Ref Range   Sodium, Ur 85 mmol/L  Creatinine, urine, random     Status: None   Collection Time: 06/23/19  6:03 PM  Result Value Ref Range   Creatinine, Urine 86.86 mg/dL  Urinalysis, Complete w Microscopic     Status: Abnormal   Collection Time: 06/23/19  6:03 PM  Result Value Ref Range   Color, Urine AMBER (A) YELLOW   APPearance CLEAR CLEAR   Specific Gravity, Urine 1.015 1.005 - 1.030   pH 7.0 5.0 - 8.0   Glucose, UA NEGATIVE NEGATIVE mg/dL   Hgb urine dipstick SMALL (A) NEGATIVE    Bilirubin Urine NEGATIVE NEGATIVE   Ketones, ur NEGATIVE NEGATIVE mg/dL   Protein, ur 30 (A) NEGATIVE mg/dL   Nitrite NEGATIVE NEGATIVE   Leukocytes,Ua MODERATE (A) NEGATIVE   RBC / HPF 0-5 0 - 5 RBC/hpf   WBC, UA 21-50 0 - 5 WBC/hpf   Bacteria, UA RARE (A) NONE SEEN   Squamous Epithelial / LPF 0-5 0 - 5  Glucose, capillary     Status: Abnormal   Collection Time: 06/23/19  8:49 PM  Result Value Ref Range   Glucose-Capillary 108 (H) 70 - 99 mg/dL  Glucose, capillary     Status: None   Collection Time: 06/23/19 11:35 PM  Result Value Ref Range   Glucose-Capillary 85 70 - 99 mg/dL  Glucose, capillary     Status: Abnormal   Collection Time: 06/24/19  3:56 AM  Result Value Ref Range   Glucose-Capillary 100 (H) 70 - 99 mg/dL  Gamma GT     Status: Abnormal   Collection Time: 06/24/19  4:16 AM  Result Value Ref Range   GGT 653 (H) 7 - 50 U/L  Comprehensive metabolic panel     Status: Abnormal   Collection Time: 06/24/19  4:16 AM  Result Value Ref Range   Sodium 152 (H) 135 - 145 mmol/L   Potassium 5.8 (H) 3.5 - 5.1 mmol/L   Chloride 112 (H) 98 - 111 mmol/L   CO2 26 22 - 32 mmol/L   Glucose, Bld 100 (H) 70 - 99 mg/dL   BUN 41 (H) 8 - 23 mg/dL   Creatinine, Ser 2.05 (H) 0.61 - 1.24 mg/dL   Calcium 9.3 8.9 - 10.3 mg/dL   Total Protein 6.7 6.5 - 8.1 g/dL   Albumin 2.2 (L) 3.5 - 5.0 g/dL   AST 99 (H) 15 - 41 U/L   ALT 70 (H) 0 - 44 U/L   Alkaline Phosphatase 729 (H) 38 - 126 U/L   Total Bilirubin 4.9 (H) 0.3 - 1.2 mg/dL   GFR calc non Af Amer 34 (L) >60 mL/min   GFR calc Af Amer 39 (L) >60 mL/min   Anion gap 14 5 - 15  Lactic acid, plasma     Status: None   Collection Time: 06/24/19  4:37 AM  Result Value Ref Range   Lactic Acid, Venous 1.0 0.5 - 1.9 mmol/L  CBC with Differential     Status: Abnormal   Collection Time: 06/24/19  4:38 AM  Result Value Ref Range   WBC 10.5 4.0 - 10.5 K/uL   RBC 2.94 (L) 4.22 - 5.81 MIL/uL   Hemoglobin 8.6 (L) 13.0 -  17.0 g/dL   HCT 27.6 (L)  39.0 - 52.0 %   MCV 93.9 80.0 - 100.0 fL   MCH 29.3 26.0 - 34.0 pg   MCHC 31.2 30.0 - 36.0 g/dL   RDW 15.6 (H) 11.5 - 15.5 %   Platelets 424 (H) 150 - 400 K/uL   nRBC 0.2 0.0 - 0.2 %   Neutrophils Relative % 71 %   Neutro Abs 7.6 1.7 - 7.7 K/uL   Lymphocytes Relative 18 %   Lymphs Abs 1.9 0.7 - 4.0 K/uL   Monocytes Relative 5 %   Monocytes Absolute 0.5 0.1 - 1.0 K/uL   Eosinophils Relative 4 %   Eosinophils Absolute 0.4 0.0 - 0.5 K/uL   Basophils Relative 1 %   Basophils Absolute 0.1 0.0 - 0.1 K/uL   Immature Granulocytes 1 %   Abs Immature Granulocytes 0.06 0.00 - 0.07 K/uL  Glucose, capillary     Status: Abnormal   Collection Time: 06/24/19  7:58 AM  Result Value Ref Range   Glucose-Capillary 103 (H) 70 - 99 mg/dL  Lactic acid, plasma     Status: None   Collection Time: 06/24/19 11:42 AM  Result Value Ref Range   Lactic Acid, Venous 1.2 0.5 - 1.9 mmol/L  Basic metabolic panel     Status: Abnormal   Collection Time: 06/24/19 11:42 AM  Result Value Ref Range   Sodium 152 (H) 135 - 145 mmol/L   Potassium 4.1 3.5 - 5.1 mmol/L   Chloride 114 (H) 98 - 111 mmol/L   CO2 24 22 - 32 mmol/L   Glucose, Bld 114 (H) 70 - 99 mg/dL   BUN 41 (H) 8 - 23 mg/dL   Creatinine, Ser 2.00 (H) 0.61 - 1.24 mg/dL   Calcium 9.9 8.9 - 10.3 mg/dL   GFR calc non Af Amer 35 (L) >60 mL/min   GFR calc Af Amer 41 (L) >60 mL/min   Anion gap 14 5 - 15     DG CHEST PORT 1 VIEW  Result Date: 06/23/2019 CLINICAL DATA:  59 year old male with cough and dyspnea. EXAM: PORTABLE CHEST 1 VIEW COMPARISON:  Chest radiograph dated 06/21/2019. FINDINGS: Interval removal of the endotracheal, and enteric tubes as well as removal of the right IJ central venous line. There is shallow inspiration with bibasilar atelectasis. Pneumonia is not excluded. Faint densities in the right mid lung field appears similar to prior radiograph. Patchy area of density at the left lung base may represent atelectasis or infiltrate. A small  left pleural effusion may be present. No pneumothorax. Stable cardiac silhouette. No acute osseous pathology. IMPRESSION: 1. Interval removal of the support apparatus. 2. Shallow inspiration with bibasilar densities similar to prior radiograph which may represent atelectasis or infiltrate. Electronically Signed   By: Anner Crete M.D.   On: 06/23/2019 19:39   [place] ASCITES (ABDOMEN LIMITED)  Result Date: 06/23/2019 CLINICAL DATA:  59 year old with increased abdominal girth. Evaluate for abdominal ascites. EXAM: LIMITED ABDOMEN ULTRASOUND FOR ASCITES TECHNIQUE: Limited ultrasound survey for ascites was performed in all four abdominal quadrants. COMPARISON:  06/22/2019 and CT 06/12/2019 FINDINGS: Markedly increased echogenicity in the visualized liver. No ascites identified. IMPRESSION: 1. No ascites. 2. Increased echogenicity in the liver. Findings are suggestive for hepatic steatosis. Electronically Signed   By: Markus Daft M.D.   On: 06/23/2019 07:48    ROS:  As stated above in the HPI otherwise negative.  Blood pressure 98/71, pulse 89, temperature 97.8 F (36.6 C), temperature source  Axillary, resp. rate 18, height 5' 6"  (1.676 m), weight 75.2 kg, SpO2 98 %.    PE: Gen: Agitated, nonverbal Lungs: CTA Bilaterally CV: RRR ABM: Soft, NTND, +BS Ext: No C/C/E  Assessment/Plan: 1) Elevated AP. 2) AMS. 3) Renal failure. 4) DM. 5) Probable cirrhosis.   The patient has markedly elevated AP.  The etiology is not clear.  This may be a multifactorial etiology with his cirrhosis, recent sepsis, and mild CHF, as evidenced by an elevation in his BNP.  Other considerations, typically in values >1000, would be malignancy and HIV.  There is an ultrasound report from 2013 and it showed steatosis.  The procedure was performed for abnormal liver enzymes.  The values for his liver panel were not available for review.  Review of his medications does not show any overt sources for DILI, but any antibiotic such  as cefepime can cause a cholestatic picture.  Plan: 1) Continue supportive care. 2) Check HIV. 3) If possible, if mental status improves, MRCP.  Kacia Halley D 06/24/2019, 12:34 PM

## 2019-06-25 DIAGNOSIS — B370 Candidal stomatitis: Secondary | ICD-10-CM

## 2019-06-25 DIAGNOSIS — E162 Hypoglycemia, unspecified: Secondary | ICD-10-CM

## 2019-06-25 DIAGNOSIS — G9340 Encephalopathy, unspecified: Secondary | ICD-10-CM

## 2019-06-25 LAB — CBC WITH DIFFERENTIAL/PLATELET
Abs Immature Granulocytes: 0.04 10*3/uL (ref 0.00–0.07)
Basophils Absolute: 0.1 10*3/uL (ref 0.0–0.1)
Basophils Relative: 1 %
Eosinophils Absolute: 0.4 10*3/uL (ref 0.0–0.5)
Eosinophils Relative: 5 %
HCT: 25.5 % — ABNORMAL LOW (ref 39.0–52.0)
Hemoglobin: 8.1 g/dL — ABNORMAL LOW (ref 13.0–17.0)
Immature Granulocytes: 1 %
Lymphocytes Relative: 23 %
Lymphs Abs: 1.8 10*3/uL (ref 0.7–4.0)
MCH: 29.5 pg (ref 26.0–34.0)
MCHC: 31.8 g/dL (ref 30.0–36.0)
MCV: 92.7 fL (ref 80.0–100.0)
Monocytes Absolute: 0.4 10*3/uL (ref 0.1–1.0)
Monocytes Relative: 5 %
Neutro Abs: 5.1 10*3/uL (ref 1.7–7.7)
Neutrophils Relative %: 65 %
Platelets: 417 10*3/uL — ABNORMAL HIGH (ref 150–400)
RBC: 2.75 MIL/uL — ABNORMAL LOW (ref 4.22–5.81)
RDW: 15.2 % (ref 11.5–15.5)
WBC: 7.8 10*3/uL (ref 4.0–10.5)
nRBC: 0.3 % — ABNORMAL HIGH (ref 0.0–0.2)

## 2019-06-25 LAB — GLUCOSE, CAPILLARY
Glucose-Capillary: 105 mg/dL — ABNORMAL HIGH (ref 70–99)
Glucose-Capillary: 129 mg/dL — ABNORMAL HIGH (ref 70–99)
Glucose-Capillary: 150 mg/dL — ABNORMAL HIGH (ref 70–99)
Glucose-Capillary: 153 mg/dL — ABNORMAL HIGH (ref 70–99)
Glucose-Capillary: 154 mg/dL — ABNORMAL HIGH (ref 70–99)
Glucose-Capillary: 160 mg/dL — ABNORMAL HIGH (ref 70–99)
Glucose-Capillary: 43 mg/dL — CL (ref 70–99)
Glucose-Capillary: 50 mg/dL — ABNORMAL LOW (ref 70–99)
Glucose-Capillary: 72 mg/dL (ref 70–99)

## 2019-06-25 LAB — COMPREHENSIVE METABOLIC PANEL
ALT: 68 U/L — ABNORMAL HIGH (ref 0–44)
AST: 87 U/L — ABNORMAL HIGH (ref 15–41)
Albumin: 2.1 g/dL — ABNORMAL LOW (ref 3.5–5.0)
Alkaline Phosphatase: 800 U/L — ABNORMAL HIGH (ref 38–126)
Anion gap: 8 (ref 5–15)
BUN: 35 mg/dL — ABNORMAL HIGH (ref 8–23)
CO2: 25 mmol/L (ref 22–32)
Calcium: 9.4 mg/dL (ref 8.9–10.3)
Chloride: 117 mmol/L — ABNORMAL HIGH (ref 98–111)
Creatinine, Ser: 1.96 mg/dL — ABNORMAL HIGH (ref 0.61–1.24)
GFR calc Af Amer: 42 mL/min — ABNORMAL LOW (ref >60)
GFR calc non Af Amer: 36 mL/min — ABNORMAL LOW (ref >60)
Glucose, Bld: 118 mg/dL — ABNORMAL HIGH (ref 70–99)
Potassium: 3.7 mmol/L (ref 3.5–5.1)
Sodium: 150 mmol/L — ABNORMAL HIGH (ref 135–145)
Total Bilirubin: 3.9 mg/dL — ABNORMAL HIGH (ref 0.3–1.2)
Total Protein: 6.6 g/dL (ref 6.5–8.1)

## 2019-06-25 LAB — PHOSPHORUS: Phosphorus: 4.3 mg/dL (ref 2.5–4.6)

## 2019-06-25 LAB — HIV ANTIBODY (ROUTINE TESTING W REFLEX): HIV Screen 4th Generation wRfx: NONREACTIVE — AB

## 2019-06-25 MED ORDER — LORAZEPAM 2 MG/ML IJ SOLN
1.0000 mg | Freq: Once | INTRAMUSCULAR | Status: AC
  Administered 2019-06-25: 1 mg via INTRAVENOUS
  Filled 2019-06-25: qty 1

## 2019-06-25 MED ORDER — LORAZEPAM 1 MG PO TABS: 1.0000 mg | ORAL_TABLET | Freq: Four times a day (QID) | ORAL | Status: DC | PRN

## 2019-06-25 MED ORDER — LORAZEPAM 2 MG/ML IJ SOLN: 1.0000 mg | Freq: Four times a day (QID) | INTRAMUSCULAR | Status: DC | PRN

## 2019-06-25 MED ORDER — CLONAZEPAM 0.5 MG PO TABS
0.5000 mg | ORAL_TABLET | Freq: Two times a day (BID) | ORAL | Status: DC
  Administered 2019-06-25 – 2019-06-26 (×4): 0.5 mg via ORAL
  Filled 2019-06-25 (×4): qty 1

## 2019-06-25 MED ORDER — NYSTATIN 100000 UNIT/ML MT SUSP
5.0000 mL | Freq: Four times a day (QID) | OROMUCOSAL | Status: DC
  Administered 2019-06-25 – 2019-06-29 (×17): 500000 [IU] via ORAL
  Filled 2019-06-25 (×17): qty 5

## 2019-06-25 NOTE — Progress Notes (Signed)
Pt kicked NT in the eye. Pt then jumped up on the bed to leap and leave. Pt currently states "I don't feel right, I need to get my shoes." On call hospitalist paged.

## 2019-06-25 NOTE — Progress Notes (Signed)
Patient ID: Scott Mcgee, male   DOB: 16-Jan-1961, 59 y.o.   MRN: [id number] S: Remains delirious and nonverbal O:BP 110/68 (BP Location: Right Arm)   Pulse 80   Temp 98.1 F (36.7 C) (Oral)   Resp (!) 32   Ht 5' 6"  (1.676 m)   Wt 75.2 kg   SpO2 97%   BMI 26.76 kg/m   Intake/Output Summary (Last 24 hours) at 06/25/2019 1241 Last data filed at 06/25/2019 1200 Gross per 24 hour  Intake 500 ml  Output 925 ml  Net -425 ml   Intake/Output: I/O last 3 completed shifts: In: 784.7 [I.V.:484.7; IV Piggyback:300] Out: 1200 [Urine:1200]  Intake/Output this shift:  Total I/O In: 450 [I.V.:250; IV Piggyback:200] Out: 375 [Urine:375] Weight change:  Gen: resting in bed comfortably but becomes agitated with voice or tactile stimulus CVS: tachy Resp: occ rhonchi Abd: distended, +BS, soft, NT Ext: no edema  Recent Labs  Lab 06/21/19 0853 06/22/19 0321 06/22/19 1046 06/23/19 0430 06/24/19 0416 06/24/19 1142 06/25/19 0305  NA 154* 148* 150* 153* 152* 152* 150*  K 4.2 4.1 4.6 4.8 5.8* 4.1 3.7  CL 112* 109 112* 113* 112* 114* 117*  CO2 29 26 24 27 26 24 25   GLUCOSE 215* 102* 104* 136* 100* 114* 118*  BUN 53* 52* 53* 56* 41* 41* 35*  CREATININE 1.51* 1.86* 2.02* 2.35* 2.05* 2.00* 1.96*  ALBUMIN 2.0*  --  2.5* 2.2* 2.2*  --  2.1*  CALCIUM 9.3 9.9 9.7 9.5 9.3 9.9 9.4  PHOS  --   --   --   --   --  4.7* 4.3  AST 94*  --  89* 79* 99*  --  87*  ALT 113*  --  99* 78* 70*  --  68*   Liver Function Tests: Recent Labs  Lab 06/23/19 0430 06/24/19 0416 06/25/19 0305  AST 79* 99* 87*  ALT 78* 70* 68*  ALKPHOS 759* 729* 800*  BILITOT 5.0* 4.9* 3.9*  PROT 6.6 6.7 6.6  ALBUMIN 2.2* 2.2* 2.1*   No results for input(s): LIPASE, AMYLASE in the last 168 hours. Recent Labs  Lab 06/22/19 1046 06/23/19 0430  AMMONIA 53* 40*   CBC: Recent Labs  Lab 06/22/19 0321 06/22/19 0321 06/23/19 0430 06/23/19 0430 06/23/19 0953 06/24/19 0438 06/25/19 0305  WBC 16.0*   < > 15.3*   < >  14.0* 10.5 7.8  NEUTROABS 12.4*   < > 11.8*   < > 10.4* 7.6 5.1  HGB 9.4*   < > 8.2*   < > 8.1* 8.6* 8.1*  HCT 30.6*   < > 26.3*   < > 26.3* 27.6* 25.5*  MCV 93.3  --  92.3  --  94.3 93.9 92.7  PLT 392   < > 379   < > 403* 424* 417*   < > = values in this interval not displayed.   Cardiac Enzymes: No results for input(s): CKTOTAL, CKMB, CKMBINDEX, TROPONINI in the last 168 hours. CBG: Recent Labs  Lab 06/25/19 0551 06/25/19 0607 06/25/19 0704 06/25/19 0810 06/25/19 1143  GLUCAP 43* 50* 160* 154* 153*    Iron Studies:  Recent Labs    06/22/19 1614  IRON 88  TIBC 252  FERRITIN 918*   Studies/Results: MR BRAIN WO CONTRAST  Result Date: 06/24/2019 CLINICAL DATA:  Encephalopathy. EXAM: MRI HEAD WITHOUT CONTRAST TECHNIQUE: Multiplanar, multiecho pulse sequences of the brain and surrounding structures were obtained without intravenous contrast. COMPARISON:  Head CT from 7  days ago FINDINGS: Brain: No acute infarction, hemorrhage, hydrocephalus, extra-axial collection or mass lesion. Greater than expected for age central atrophy with ventriculomegaly. Minimal small vessel ischemic type change. No chronic blood products. Vascular: Normal flow voids Skull and upper cervical spine: Normal marrow signal Sinuses/Orbits: Retention cysts in the right maxillary sinus. IMPRESSION: 1. No acute finding.  Motion degraded study without acute finding. 2. Generalized brain atrophy. Electronically Signed   By: Monte Fantasia M.D.   On: 06/24/2019 12:42   DG CHEST PORT 1 VIEW  Result Date: 06/23/2019 CLINICAL DATA:  59 year old male with cough and dyspnea. EXAM: PORTABLE CHEST 1 VIEW COMPARISON:  Chest radiograph dated 06/21/2019. FINDINGS: Interval removal of the endotracheal, and enteric tubes as well as removal of the right IJ central venous line. There is shallow inspiration with bibasilar atelectasis. Pneumonia is not excluded. Faint densities in the right mid lung field appears similar to prior  radiograph. Patchy area of density at the left lung base may represent atelectasis or infiltrate. A small left pleural effusion may be present. No pneumothorax. Stable cardiac silhouette. No acute osseous pathology. IMPRESSION: 1. Interval removal of the support apparatus. 2. Shallow inspiration with bibasilar densities similar to prior radiograph which may represent atelectasis or infiltrate. Electronically Signed   By: Anner Crete M.D.   On: 06/23/2019 19:39   . chlorhexidine gluconate (MEDLINE KIT)  15 mL Mouth Rinse BID  . Chlorhexidine Gluconate Cloth  6 each Topical Daily  . feeding supplement  1 Container Oral TID BM  . folic acid  1 mg Oral Daily  . Gerhardt's butt cream   Topical BID  . heparin injection (subcutaneous)  5,000 Units Subcutaneous Q8H  . insulin aspart  0-15 Units Subcutaneous Q4H  . insulin aspart  6 Units Subcutaneous Q4H  . insulin glargine  15 Units Subcutaneous Daily  . lactulose  10 g Oral Daily  . mouth rinse  15 mL Mouth Rinse BID  . multivitamin with minerals  1 tablet Oral Daily  . nystatin  5 mL Oral QID  . polyethylene glycol  17 g Oral Daily  . QUEtiapine  50 mg Oral QHS  . senna-docusate  1 tablet Oral BID  . thiamine  100 mg Oral Daily   Or  . thiamine  100 mg Intravenous Daily    BMET    Component Value Date/Time   NA 150 (H) 06/25/2019 0305   K 3.7 06/25/2019 0305   CL 117 (H) 06/25/2019 0305   CO2 25 06/25/2019 0305   GLUCOSE 118 (H) 06/25/2019 0305   BUN 35 (H) 06/25/2019 0305   CREATININE 1.96 (H) 06/25/2019 0305   CALCIUM 9.4 06/25/2019 0305   GFRNONAA 36 (L) 06/25/2019 0305   GFRAA 42 (L) 06/25/2019 0305   CBC    Component Value Date/Time   WBC 7.8 06/25/2019 0305   RBC 2.75 (L) 06/25/2019 0305   HGB 8.1 (L) 06/25/2019 0305   HCT 25.5 (L) 06/25/2019 0305   PLT 417 (H) 06/25/2019 0305   MCV 92.7 06/25/2019 0305   MCH 29.5 06/25/2019 0305   MCHC 31.8 06/25/2019 0305   RDW 15.2 06/25/2019 0305   LYMPHSABS 1.8 06/25/2019  0305   MONOABS 0.4 06/25/2019 0305   EOSABS 0.4 06/25/2019 0305   BASOSABS 0.1 06/25/2019 0305    Assessment/Plan: 1. AKI/CKD stage 3- in setting of recent urosepsis now with decreased UOP, hypotension and worsening renal function. No nephrotoxic agents noted. Pt not able to drink due to AMS so  intravascular volume depletion may play a role, however he has also been on abx so AIN is also on DDx as well as ischemic ATN due to relative hypotension.  1. UA with blood, protein, and leukocytes.  Given AMS and recurrent AKI will start acute GN/vasculitis workup.  Serologies are still pending. 2. Also ordered SPEP/UPEP. 3. Improved UOP overnight with drop in Cr and K. 2. Hyperkalemia- started lokelma 10 gm bid and follow.  Will recheck later today. 3. Acute metabolic encephalopathy- possible alcohol withdrawal but has been treated with benzos.  Negative workup so far.  Consider Neuro consult to help evaluate and manage his ongoing delirium. 4. Abnormal LFT's- elevated GGT and alk phos.  Appreciate GI evaluation.  Plan for MRCP when more stable.  5. Abdominal distension- no ascites, likely due to intraperitoneal fat. 6. Hypernatremia- due to inability to drink. Need to replete with D5W or D51/2 NS if his blood pressure remains low and follow. Could also place NGT and give free water boluses that way. No evidence of volume overload on exam. 7. Diastolic CHF- stable volume 8. Anemia of critical illness- cont to follow 9. DM per primary.  Donetta Potts, MD [employer] [phone]

## 2019-06-25 NOTE — Progress Notes (Signed)
Patient found down in the floor on one knee in mittens appearing confused and disoriented. I approached and asked if he knew where he was at, he stated, "No where am I?" I told him he is in the hospital. He asked where and stated he was in [place]. I asked him to help me get him into the bed which he was able to do with assistance. I assisted him with getting to the bedside in the seated position and the RN and CNA took over from there.

## 2019-06-25 NOTE — Progress Notes (Signed)
Subjective: See below.  Objective: Vital signs in last 24 hours: Temp:  [97.5 F (36.4 C)-99.1 F (37.3 C)] 99.1 F (37.3 C) (02/21 0807) Pulse Rate:  [72-94] 79 (02/21 0900) Resp:  [16-39] 20 (02/21 0807) BP: (93-133)/(54-104) 96/60 (02/21 0807) SpO2:  [90 %-96 %] 96 % (02/21 0417) Last BM Date: 06/24/19  Intake/Output from previous day: 02/20 0701 - 02/21 0700 In: 100 [IV Piggyback:100] Out: 550 [Urine:550] Intake/Output this shift: Total I/O In: -  Out: 375 [Urine:375]  General appearance: arousable to voice, but still confused and agitated Resp: clear to auscultation bilaterally Cardio: regular rate and rhythm GI: soft, non-tender; bowel sounds normal; no masses,  no organomegaly Extremities: extremities normal, atraumatic, no cyanosis or edema  Lab Results: Recent Labs    06/23/19 0953 06/24/19 0438 06/25/19 0305  WBC 14.0* 10.5 7.8  HGB 8.1* 8.6* 8.1*  HCT 26.3* 27.6* 25.5*  PLT 403* 424* 417*   BMET Recent Labs    06/24/19 0416 06/24/19 1142 06/25/19 0305  NA 152* 152* 150*  K 5.8* 4.1 3.7  CL 112* 114* 117*  CO2 26 24 25   GLUCOSE 100* 114* 118*  BUN 41* 41* 35*  CREATININE 2.05* 2.00* 1.96*  CALCIUM 9.3 9.9 9.4   LFT Recent Labs    06/25/19 0305  PROT 6.6  ALBUMIN 2.1*  AST 87*  ALT 68*  ALKPHOS 800*  BILITOT 3.9*   PT/INR No results for input(s): LABPROT, INR in the last 72 hours. Hepatitis Panel No results for input(s): HEPBSAG, HCVAB, HEPAIGM, HEPBIGM in the last 72 hours. C-Diff No results for input(s): CDIFFTOX in the last 72 hours. Fecal Lactopherrin No results for input(s): FECLLACTOFRN in the last 72 hours.  Studies/Results: MR BRAIN WO CONTRAST  Result Date: 06/24/2019 CLINICAL DATA:  Encephalopathy. EXAM: MRI HEAD WITHOUT CONTRAST TECHNIQUE: Multiplanar, multiecho pulse sequences of the brain and surrounding structures were obtained without intravenous contrast. COMPARISON:  Head CT from 7 days ago FINDINGS: Brain: No  acute infarction, hemorrhage, hydrocephalus, extra-axial collection or mass lesion. Greater than expected for age central atrophy with ventriculomegaly. Minimal small vessel ischemic type change. No chronic blood products. Vascular: Normal flow voids Skull and upper cervical spine: Normal marrow signal Sinuses/Orbits: Retention cysts in the right maxillary sinus. IMPRESSION: 1. No acute finding.  Motion degraded study without acute finding. 2. Generalized brain atrophy. Electronically Signed   By: Monte Fantasia M.D.   On: 06/24/2019 12:42   DG CHEST PORT 1 VIEW  Result Date: 06/23/2019 CLINICAL DATA:  59 year old male with cough and dyspnea. EXAM: PORTABLE CHEST 1 VIEW COMPARISON:  Chest radiograph dated 06/21/2019. FINDINGS: Interval removal of the endotracheal, and enteric tubes as well as removal of the right IJ central venous line. There is shallow inspiration with bibasilar atelectasis. Pneumonia is not excluded. Faint densities in the right mid lung field appears similar to prior radiograph. Patchy area of density at the left lung base may represent atelectasis or infiltrate. A small left pleural effusion may be present. No pneumothorax. Stable cardiac silhouette. No acute osseous pathology. IMPRESSION: 1. Interval removal of the support apparatus. 2. Shallow inspiration with bibasilar densities similar to prior radiograph which may represent atelectasis or infiltrate. Electronically Signed   By: Anner Crete M.D.   On: 06/23/2019 19:39    Medications:  Scheduled: . chlorhexidine gluconate (MEDLINE KIT)  15 mL Mouth Rinse BID  . Chlorhexidine Gluconate Cloth  6 each Topical Daily  . feeding supplement  1 Container Oral TID BM  .  folic acid  1 mg Oral Daily  . Gerhardt's butt cream   Topical BID  . heparin injection (subcutaneous)  5,000 Units Subcutaneous Q8H  . insulin aspart  0-15 Units Subcutaneous Q4H  . insulin aspart  6 Units Subcutaneous Q4H  . insulin glargine  15 Units  Subcutaneous Daily  . lactulose  10 g Oral Daily  . mouth rinse  15 mL Mouth Rinse BID  . multivitamin with minerals  1 tablet Oral Daily  . nystatin  5 mL Oral QID  . polyethylene glycol  17 g Oral Daily  . QUEtiapine  50 mg Oral QHS  . senna-docusate  1 tablet Oral BID  . thiamine  100 mg Oral Daily   Or  . thiamine  100 mg Intravenous Daily   Continuous: . sodium chloride 10 mL/hr at 06/21/19 1100  . ceFEPime (MAXIPIME) IV 2 g (06/25/19 0946)  . dextrose 50 mL/hr at 06/24/19 2241    Assessment/Plan: 1) AMS. 2) Elevated AP. 3) History of ETOH abuse.   It is not common, but patient's can have a delay in withdrawal from ETOH.  This may be the situation for his AMS.  As for his elevated AP, it remains elevated and stable.  His HIV is negative.  Once he is stable, an MRCP will help in this situation.  I am not convinced that there is an autoimmune etiology for his liver issues.  Again, this is most likely multifactorial.  Plan: 1) Continue with supportive care.  LOS: 13 days   Makhi Muzquiz D 06/25/2019, 11:35 AM

## 2019-06-25 NOTE — Progress Notes (Signed)
Blood sugar 50 repeated x 2 for verification,amp D50 given, Dr.blount paged and made aware. Patient remains responsive, no untowards effect noted

## 2019-06-25 NOTE — Progress Notes (Signed)
PROGRESS NOTE    Scott Mcgee    Code Status: Full Code  FBP:[id number] DOB: Jun 18, 1960 DOA: 06/12/2019 LOS: 13 days  PCP: [facility] CC:  Chief Complaint  Patient presents with  . [facility] Summary  This is a 59 year old male with past medical history of diabetes, hypertension, prostate cancer who was admitted on 2/8 with dysuria, low-grade fevers for several days as well as dysuria x2 weeks.  To have hyperglycemia, hyponatremia and elevated creatinine ED UA positive for UTI.  CT abdomen/pelvis with stranding around kidneys without other abnormalities.  Started on insulin drip and IV fluids.  PCCM consulted on 2/9 for acute hypoxemic respiratory failure and in respiratory distress and patient was subsequently intubated.  Meropenem changed to ceftriaxone on 2/10.  Change in mental status on 2/13 noted by PCCM with bradycardia and EEG was ordered and patient was further sedated.  EEG showed profound diffuse encephalopathy without specific etiology and no seizures or epileptiform discharges.  Patient was successfully extubated on 2/17.  Patient transferred to progressive floor and TRH resumed care 2/18.  2/18: Noted to have CIWA 17 early a.m. with frequent Ativan administration per CIWA protocol. Diaphoresis, Rigors noted with abdominal distention.  Ammonia level minimally elevated.  Started on lactulose.  Also started on D5W for hypernatremia and free water deficit.  Foley catheter placed due to urinary retention.  Repeat blood cultures ordered and US guided paracentesis ordered.  2/19: Nephrology consulted for worsening renal failure 2/20: Gi consulted for persistent elevated Alk Phos and GGT. MRI brain. Void trial  A & P   Active Problems:   Type 2 diabetes mellitus with hyperosmolar hyperglycemic state (HHS) ([place])   Hyponatremia   HTN (hypertension)   Acute renal failure (HCC)   Abdominal distention   Alcohol use   Sepsis ([place])   Acute  pyelonephritis   Acute respiratory failure ([place])   1. Severe sepsis with recent multidrug resistant E. coli UTI, likely aspiration pneumonia post extubation vs. unlikely GI source Sepsis resolved a. 2 days meropenem followed by 7 days ceftriaxone in ICU (completed 2/16).  b. Started on Cefepime 2/19 due to sepsis unknown source, possibly aspiration (CXR 2/17 with slight worsening patchy opacity in right lung base), Procalcitonin 1.9 c. Now afebrile with resolved leukocytosis d. Will stop Cefepime at this time, as below e. blood cultures, no growth to date 2. Acute encephalopathy, likely multifactorial: Sepsis, hypernatremia, AKI, hepatic encephalopathy, hypoxemia, hypoglycemia, medication induced (ativan, Cefepime) a. Discussed with PCCM today. He is behaving more like a medication induced encephalopathy at this point (negative head scans, resolved sepsis, improving electrolytes, no focal deficit), probably from Cefepime psychosis in setting of renal failure as well as likely from high doses of Ativan. He may also be withdrawing from Ativan. He is likely not withdrawing from alcohol two weeks from his last drink b. CT brain negative for acute abnormality c. EEG unremarkable for seizure d. MRI brain unremarkable e. Will discontinue CIWA protocol, start benzo taper with Clonazepam 0.5 mg bid and taper off  f. Discontinue seroquel g. Discontinue Cefepime h. Continue to treat other problems i. If not improving over the next day or so, then will consult Neuro 3. Hypernatremia due to poor p.o. intake a. Improving with D5W b. G tube insertion 4. 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 Initially improved, then Cr worsened again. Nephrology was consulted 2/19 b. Vasculitic workup per nephro c. Had mild hypercalcemia (corrected), anemia,  AKI, high normal TP and low albumin. Checking Myeloma workup 5. Hypoglycemia a. Resolved with Dextrose 6. Hyperkalemia a. Resolved with Lokelma b. Discontinue  Lokelma 7. Alcohol abuse a. Last drink was 10+ days ago (prior to admission), unlikely withdrawing at this point b. Benzos as above 8. Abdominal distention a. This is patient's normal body habitus (confirmed by radiology from CT scan and [place]) b. Continue bowel regimen 9. Elevated LFTs a. Elevated ammonia improved with lactulose b. Alk phos still elevated (800), GGT 653, T bili elevated  c. AST/ALT improved d. RUQ ultrasound with hepatic steatosis, suspected cirrhosis and without cholelithiasis e. Ultrasound-guided paracentesis ordered, no ascites noted f. GI consulted, MRCP when stable  10. Acute hypoxemic respiratory failure, Initially volume overloaded leading to intubation, then suspected aspiration pneumonia post extubation a. Intubated 2/9-2/17, transferred to stepdown/TRH 2/18 b. Volume improved c. Completed 3 days cefepime for pneumonia, afebrile and with resolved leukocytosis d. Discontinue Cefepime as above, hold off on further antibiotics for now 11. Mild urinary retention, unknown etiology a. Foley catheter placed 2/18, discontinued 2/20 b. Bladder scan and straight cath protocol 12. HFpEF, compensated a. Stable volume today b. I/os and Daily weights 13. Normocytic anemia, suspect AoCD a. Differential unremarkable b. LDH and Haptoglobin normal 14. Thrush a. Nystatin 15. Diabetes a. Continue current regimen   DVT prophylaxis: Subcu heparin Family Communication: wife updated  Disposition Plan:   Patient came from:  Home                                                                                          Anticipated d/c place: SNF  Barriers to d/c: Medical stability  Pressure injury documentation    None  Consultants  PCCM GI Nephro  Procedures  ETT 2/9-2/17 CT abd  2/8: Cholelithiasis without complicating factors. Mild perinephric stranding is noted on the left without obstructive change. Underlying UTI could not be totally excluded. Mild  bibasilar atelectasis. 2/9 echo: LVEF 60-65%, LVH 2/13 EEG:   Antibiotics   Anti-infectives (From admission, onward)   Start     Dose/Rate Route Frequency Ordered Stop   06/23/19 1900  ceFEPIme (MAXIPIME) 2 g in sodium chloride 0.9 % 100 mL IVPB  Status:  Discontinued     2 g 200 mL/hr over 30 Minutes Intravenous Every 12 hours 06/23/19 1815 06/25/19 1314   06/14/19 1830  cefTRIAXone (ROCEPHIN) 2 g in sodium chloride 0.9 % 100 mL IVPB     2 g 200 mL/hr over 30 Minutes Intravenous Every 24 hours 06/14/19 1020 06/20/19 1803   06/12/19 1830  meropenem (MERREM) 1 g in sodium chloride 0.9 % 100 mL IVPB  Status:  Discontinued     1 g 200 mL/hr over 30 Minutes Intravenous Every 12 hours 06/12/19 1818 06/14/19 1020   06/12/19 1745  cefTRIAXone (ROCEPHIN) 2 g in sodium chloride 0.9 % 100 mL IVPB  Status:  Discontinued     2 g 200 mL/hr over 30 Minutes Intravenous Every 24 hours 06/12/19 1743 06/12/19 1814        Subjective   Patient was given Ativan again this morning for tremors, delirium. He was somnolent  when I saw him this morning and difficult to obtain history from. He was also noted to by hypoglycemic which resolved with Dextrose. ROS not able to be obtained.  Objective   Vitals:   06/25/19 0807 06/25/19 0900 06/25/19 1202 06/25/19 1309  BP: 96/60  110/68   Pulse: 72 79 80 80  Resp: 20  (!) 32 (!) 22  Temp: 99.1 F (37.3 C)  98.1 F (36.7 C)   TempSrc: Axillary  Oral   SpO2:   97% 97%  Weight:      Height:        Intake/Output Summary (Last 24 hours) at 06/25/2019 1327 Last data filed at 06/25/2019 1319 Gross per 24 hour  Intake 500 ml  Output 1150 ml  Net -650 ml   Filed Weights   06/20/19 0404 06/21/19 0456 06/24/19 0410  Weight: 75.6 kg 77 kg 75.2 kg    Examination:  Physical Exam Vitals and nursing note reviewed. Exam conducted with a chaperone present.  Constitutional:      Appearance: He is ill-appearing.     Comments: somnolent  HENT:     Head:  Normocephalic.     Nose:     Comments: Nasal canula     Mouth/Throat:     Comments: thrush Cardiovascular:     Rate and Rhythm: Regular rhythm. Tachycardia present.  Pulmonary:     Effort: Pulmonary effort is normal.     Breath sounds: Normal breath sounds.  Abdominal:     General: Bowel sounds are normal.     Tenderness: There is no abdominal tenderness.  Musculoskeletal:        General: No swelling or tenderness.  Neurological:     Mental Status: He is disoriented.     Data Reviewed: I have personally reviewed following labs and imaging studies  CBC: Recent Labs  Lab 06/22/19 0321 06/23/19 0430 06/23/19 0953 06/24/19 0438 06/25/19 0305  WBC 16.0* 15.3* 14.0* 10.5 7.8  NEUTROABS 12.4* 11.8* 10.4* 7.6 5.1  HGB 9.4* 8.2* 8.1* 8.6* 8.1*  HCT 30.6* 26.3* 26.3* 27.6* 25.5*  MCV 93.3 92.3 94.3 93.9 92.7  PLT 392 379 403* 424* 947*   Basic Metabolic Panel: Recent Labs  Lab 06/19/19 0445 06/20/19 0343 06/22/19 1046 06/23/19 0430 06/24/19 0416 06/24/19 1142 06/25/19 0305  NA 153*   < > 150* 153* 152* 152* 150*  K 3.5   < > 4.6 4.8 5.8* 4.1 3.7  CL 105   < > 112* 113* 112* 114* 117*  CO2 31   < > 24 27 26 24 25   GLUCOSE 195*   < > 104* 136* 100* 114* 118*  BUN 62*   < > 53* 56* 41* 41* 35*  CREATININE 1.85*   < > 2.02* 2.35* 2.05* 2.00* 1.96*  CALCIUM 9.8   < > 9.7 9.5 9.3 9.9 9.4  MG 2.1  --   --  2.4  --  2.4  --   PHOS  --   --   --   --   --  4.7* 4.3   < > = values in this interval not displayed.   GFR: Estimated Creatinine Clearance: 35.7 mL/min (A) (by C-G formula based on SCr of 1.96 mg/dL (H)). Liver Function Tests: Recent Labs  Lab 06/21/19 0853 06/22/19 1046 06/23/19 0430 06/24/19 0416 06/25/19 0305  AST 94* 89* 79* 99* 87*  ALT 113* 99* 78* 70* 68*  ALKPHOS 865* 729* 759* 729* 800*  BILITOT 3.2* 4.3* 5.0* 4.9*  3.9*  PROT 6.5 7.1 6.6 6.7 6.6  ALBUMIN 2.0* 2.5* 2.2* 2.2* 2.1*   No results for input(s): LIPASE, AMYLASE in the last 168  hours. Recent Labs  Lab 06/22/19 1046 06/23/19 0430  AMMONIA 53* 40*   Coagulation Profile: No results for input(s): INR, PROTIME in the last 168 hours. Cardiac Enzymes: No results for input(s): CKTOTAL, CKMB, CKMBINDEX, TROPONINI in the last 168 hours. BNP (last 3 results) No results for input(s): PROBNP in the last 8760 hours. HbA1C: No results for input(s): HGBA1C in the last 72 hours. CBG: Recent Labs  Lab 06/25/19 0551 06/25/19 0607 06/25/19 0704 06/25/19 0810 06/25/19 1143  GLUCAP 43* 50* 160* 154* 153*   Lipid Profile: No results for input(s): CHOL, HDL, LDLCALC, TRIG, CHOLHDL, LDLDIRECT in the last 72 hours. Thyroid Function Tests: No results for input(s): TSH, T4TOTAL, FREET4, T3FREE, THYROIDAB in the last 72 hours. Anemia Panel: Recent Labs    06/22/19 1614  FERRITIN 918*  TIBC 252  IRON 88   Sepsis Labs: Recent Labs  Lab 06/22/19 1614 06/24/19 0437 06/24/19 1142  PROCALCITON 1.90  --   --   LATICACIDVEN  --  1.0 1.2    Recent Results (from the past 240 hour(s))  Culture, respiratory (non-expectorated)     Status: None   Collection Time: 06/21/19  9:37 AM   Specimen: Tracheal Aspirate; Respiratory  Result Value Ref Range Status   Specimen Description TRACHEAL ASPIRATE  Final   Special Requests Normal  Final   Gram Stain   Final    ABUNDANT WBC PRESENT, PREDOMINANTLY PMN NO ORGANISMS SEEN Performed at [facility], [address], [place], [place] [address]    Culture RARE CANDIDA ALBICANS  Final   Report Status 06/24/2019 FINAL  Final  Culture, blood (routine x 2)     Status: None (Preliminary result)   Collection Time: 06/22/19 10:42 AM   Specimen: BLOOD  Result Value Ref Range Status   Specimen Description BLOOD LEFT ANTECUBITAL  Final   Special Requests   Final    BOTTLES DRAWN AEROBIC AND ANAEROBIC Blood Culture results may not be optimal due to an excessive volume of blood received in culture bottles Performed at [facility] [address] [place], [place] [address]    Culture NO GROWTH 2 DAYS  Final   Report Status PENDING  Incomplete  Culture, blood (routine x 2)     Status: None (Preliminary result)   Collection Time: 06/22/19 10:47 AM   Specimen: BLOOD RIGHT HAND  Result Value Ref Range Status   Specimen Description BLOOD RIGHT HAND  Final   Special Requests   Final    BOTTLES DRAWN AEROBIC AND ANAEROBIC Blood Culture results may not be optimal due to an excessive volume of blood received in culture bottles Performed at [facility], [place] [address] [place], [place] [address]    Culture NO GROWTH 2 DAYS  Final   Report Status PENDING  Incomplete         Radiology Studies: MR BRAIN WO CONTRAST  Result Date: 06/24/2019 CLINICAL DATA:  Encephalopathy. EXAM: MRI HEAD WITHOUT CONTRAST TECHNIQUE: Multiplanar, multiecho pulse sequences of the brain and surrounding structures were obtained without intravenous contrast. COMPARISON:  Head CT from 7 days ago FINDINGS: Brain: No acute infarction, hemorrhage, hydrocephalus, extra-axial collection or mass lesion. Greater than expected for age central atrophy with ventriculomegaly. Minimal small vessel ischemic type change. No chronic blood products. Vascular: Normal flow voids  Skull and upper cervical spine: Normal marrow signal Sinuses/Orbits: Retention cysts in the right maxillary sinus. IMPRESSION: 1. No acute finding.  Motion degraded study without acute finding. 2. Generalized brain atrophy. Electronically Signed   By: Monte Fantasia M.D.   On: 06/24/2019 12:42   DG CHEST PORT 1 VIEW  Result Date: 06/23/2019 CLINICAL DATA:  59 year old male with cough and dyspnea. EXAM: PORTABLE CHEST 1 VIEW COMPARISON:  Chest radiograph dated 06/21/2019. FINDINGS: Interval removal of the endotracheal, and enteric tubes as well as removal of the right IJ central venous line. There is shallow inspiration with bibasilar atelectasis. Pneumonia is not excluded. Faint  densities in the right mid lung field appears similar to prior radiograph. Patchy area of density at the left lung base may represent atelectasis or infiltrate. A small left pleural effusion may be present. No pneumothorax. Stable cardiac silhouette. No acute osseous pathology. IMPRESSION: 1. Interval removal of the support apparatus. 2. Shallow inspiration with bibasilar densities similar to prior radiograph which may represent atelectasis or infiltrate. Electronically Signed   By: Anner Crete M.D.   On: 06/23/2019 19:39        Scheduled Meds: . chlorhexidine gluconate (MEDLINE KIT)  15 mL Mouth Rinse BID  . Chlorhexidine Gluconate Cloth  6 each Topical Daily  . feeding supplement  1 Container Oral TID BM  . folic acid  1 mg Oral Daily  . Gerhardt's butt cream   Topical BID  . heparin injection (subcutaneous)  5,000 Units Subcutaneous Q8H  . insulin aspart  0-15 Units Subcutaneous Q4H  . insulin aspart  6 Units Subcutaneous Q4H  . insulin glargine  15 Units Subcutaneous Daily  . lactulose  10 g Oral Daily  . mouth rinse  15 mL Mouth Rinse BID  . multivitamin with minerals  1 tablet Oral Daily  . nystatin  5 mL Oral QID  . polyethylene glycol  17 g Oral Daily  . QUEtiapine  50 mg Oral QHS  . senna-docusate  1 tablet Oral BID  . thiamine  100 mg Oral Daily   Or  . thiamine  100 mg Intravenous Daily   Continuous Infusions: . sodium chloride 10 mL/hr at 06/21/19 1100  . dextrose 50 mL/hr at 06/24/19 2241     Time spent: 40 minutes with over 50% of the time coordinating the patient's care    Harold Hedge, DO [facility] Pager [phone]  Call night coverage person covering after 7pm

## 2019-06-26 LAB — CBC WITH DIFFERENTIAL/PLATELET
Abs Immature Granulocytes: 0.04 10*3/uL (ref 0.00–0.07)
Basophils Absolute: 0.1 10*3/uL (ref 0.0–0.1)
Basophils Relative: 1 %
Eosinophils Absolute: 0.4 10*3/uL (ref 0.0–0.5)
Eosinophils Relative: 4 %
HCT: 27 % — ABNORMAL LOW (ref 39.0–52.0)
Hemoglobin: 8.3 g/dL — ABNORMAL LOW (ref 13.0–17.0)
Immature Granulocytes: 1 %
Lymphocytes Relative: 24 %
Lymphs Abs: 2 10*3/uL (ref 0.7–4.0)
MCH: 29.1 pg (ref 26.0–34.0)
MCHC: 30.7 g/dL (ref 30.0–36.0)
MCV: 94.7 fL (ref 80.0–100.0)
Monocytes Absolute: 0.5 10*3/uL (ref 0.1–1.0)
Monocytes Relative: 5 %
Neutro Abs: 5.7 10*3/uL (ref 1.7–7.7)
Neutrophils Relative %: 65 %
Platelets: 458 10*3/uL — ABNORMAL HIGH (ref 150–400)
RBC: 2.85 MIL/uL — ABNORMAL LOW (ref 4.22–5.81)
RDW: 15.1 % (ref 11.5–15.5)
WBC: 8.6 10*3/uL (ref 4.0–10.5)
nRBC: 0 % (ref 0.0–0.2)

## 2019-06-26 LAB — COMPREHENSIVE METABOLIC PANEL
ALT: 57 U/L — ABNORMAL HIGH (ref 0–44)
AST: 63 U/L — ABNORMAL HIGH (ref 15–41)
Albumin: 2.2 g/dL — ABNORMAL LOW (ref 3.5–5.0)
Alkaline Phosphatase: 768 U/L — ABNORMAL HIGH (ref 38–126)
Anion gap: 12 (ref 5–15)
BUN: 23 mg/dL (ref 8–23)
CO2: 24 mmol/L (ref 22–32)
Calcium: 9.1 mg/dL (ref 8.9–10.3)
Chloride: 113 mmol/L — ABNORMAL HIGH (ref 98–111)
Creatinine, Ser: 1.88 mg/dL — ABNORMAL HIGH (ref 0.61–1.24)
GFR calc Af Amer: 44 mL/min — ABNORMAL LOW (ref >60)
GFR calc non Af Amer: 38 mL/min — ABNORMAL LOW (ref >60)
Glucose, Bld: 159 mg/dL — ABNORMAL HIGH (ref 70–99)
Potassium: 3.6 mmol/L (ref 3.5–5.1)
Sodium: 149 mmol/L — ABNORMAL HIGH (ref 135–145)
Total Bilirubin: 2.7 mg/dL — ABNORMAL HIGH (ref 0.3–1.2)
Total Protein: 6.8 g/dL (ref 6.5–8.1)

## 2019-06-26 LAB — GLUCOSE, CAPILLARY
Glucose-Capillary: 108 mg/dL — ABNORMAL HIGH (ref 70–99)
Glucose-Capillary: 116 mg/dL — ABNORMAL HIGH (ref 70–99)
Glucose-Capillary: 117 mg/dL — ABNORMAL HIGH (ref 70–99)
Glucose-Capillary: 144 mg/dL — ABNORMAL HIGH (ref 70–99)
Glucose-Capillary: 147 mg/dL — ABNORMAL HIGH (ref 70–99)
Glucose-Capillary: 161 mg/dL — ABNORMAL HIGH (ref 70–99)
Glucose-Capillary: 222 mg/dL — ABNORMAL HIGH (ref 70–99)

## 2019-06-26 LAB — AMMONIA: Ammonia: 39 umol/L — ABNORMAL HIGH (ref 9–35)

## 2019-06-26 LAB — PHOSPHORUS: Phosphorus: 3.7 mg/dL (ref 2.5–4.6)

## 2019-06-26 LAB — MAGNESIUM: Magnesium: 2.3 mg/dL (ref 1.7–2.4)

## 2019-06-26 LAB — CYTOLOGY - NON PAP

## 2019-06-26 MED ORDER — INSULIN ASPART 100 UNIT/ML ~~LOC~~ SOLN: 0.0000 [IU] | Freq: Four times a day (QID) | SUBCUTANEOUS | Status: DC

## 2019-06-26 MED ORDER — INSULIN ASPART 100 UNIT/ML ~~LOC~~ SOLN
0.0000 [IU] | Freq: Three times a day (TID) | SUBCUTANEOUS | Status: DC
  Administered 2019-06-26: 5 [IU] via SUBCUTANEOUS
  Administered 2019-06-26: 2 [IU] via SUBCUTANEOUS
  Administered 2019-06-27: 3 [IU] via SUBCUTANEOUS
  Administered 2019-06-27: 2 [IU] via SUBCUTANEOUS
  Administered 2019-06-27: 5 [IU] via SUBCUTANEOUS
  Administered 2019-06-28: 1 [IU] via SUBCUTANEOUS
  Administered 2019-06-28 – 2019-06-29 (×5): 2 [IU] via SUBCUTANEOUS
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
## 2019-06-26 NOTE — Progress Notes (Addendum)
MEWS: Pt has moments of increased respirations into the 30s.
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
## 2019-06-26 NOTE — Progress Notes (Signed)
  Speech Language Pathology Treatment: Dysphagia  Patient Details Name: Scott Mcgee MRN: [id number] DOB: [birth year] Today's Date: 06/26/2019 Time: 1368-5992 SLP Time Calculation (min) (ACUTE ONLY): 12 min  Assessment / Plan / Recommendation Clinical Impression  Pt had fluctuating MS over the w/e, made NPO again and new swallow eval orders received. Currently restrained.  Released right hand so that pt could self feed applesauce and drink water.  While he remains confused, impulsive, and inappropriate, he is doing much better today with swallowing/eating.  He declined regular solids, but accepted purees and 3 oz thin water with adequate oral attention, swift swallow, and no s/s of aspiration.  Oral tremor has disappeared and pt is asking questions about his condition.  Recommend starting a regular consistency diet, thin liquids.  Pt will need assist with meals due to his MS, but his swallowing is [facility]. No SLP f/u is needed.  Right restraint reapplied prior to leaving the room.   HPI HPI: 59 yo male with PMH of DM, HTN, and prostate CA.  Admitted on 2/8 with dysuria and fever.  Found to be hyperglycemic, hyponatremic, and positive UTI with sepsis. On 2/9 pt with resp failure and required intubation.  EEG on 2/13 showed profound diffuse encephalopathy.  Pt was extubated on 2/17. Noted to have some oral deficits when drinking water, therefore SLP was ordered.       SLP Plan  All goals met       Recommendations  Diet recommendations: Regular;Thin liquid Liquids provided via: Cup;Straw Medication Administration: Whole meds with puree Supervision: Patient able to self feed;Staff to assist with self feeding Compensations: Minimize environmental distractions                Oral Care Recommendations: Oral care BID Follow up Recommendations: None SLP Visit Diagnosis: Dysphagia, unspecified (R13.10) Plan: All goals met       GO              Jai Bear L. Tivis Ringer, [place] Office number [phone] Pager [phone]   [name] 06/26/2019, 1:48 PM

## 2019-06-26 NOTE — Progress Notes (Addendum)
UNASSIGNED PATIENT Subjective: [name] is a 59 year old black male with multiple medical problems including diabetes hypertension and prostate cancer was admitted on the eighth with low-grade fever and dysuria.  He was diagnosed with a UTI and started on antibiotics CT scan of the abdomen pelvis showed stranding around the kidneys. CCM was consulted on the ninth for acute hypoxemic respiratory failure and was subsequently intubated there is a change in his mental status noted on the 13th with bradycardia and EEG was ordered.  The EEG showed PROfound diffuse encephalopathy without any specific etiology and no seizures or epileptiform discharges.  Patient was extubated on 06/21/2019.  GI consultation was requested for elevated alkaline phosphatase that was at 729 on the 2/20, 800 on 2/21 down to 729 today. His AST and ALT are also improving gradually. Patient seems to be alert and awake and interactive today and denies having any abdominal pain nausea vomiting. He has had a normal bowel movement this morning without any blood or mucus in the stool  Objective: Vital signs in last 24 hours: Temp:  [98 F (36.7 C)-98.4 F (36.9 C)] 98 F (36.7 C) (02/22 1225) Pulse Rate:  [80-90] 80 (02/22 0835) Resp:  [20-33] 29 (02/22 1225) BP: (96-123)/(59-85) 123/85 (02/22 1225) SpO2:  [94 %-100 %] 100 % (02/22 1225) Last BM Date: 06/25/19  Intake/Output from previous day: 02/21 0701 - 02/22 0700 In: 450 [I.V.:250; IV Piggyback:200] Out: 1150 [Urine:1150] Intake/Output this shift: No intake/output data recorded.  General appearance: cooperative, appears stated age, no distress and pale, in two-point restraints Resp: clear to auscultation bilaterally Cardio: regular rate and rhythm, S1, S2 normal, no murmur, click, rub or gallop GI: soft, obese, non-tender; bowel sounds normal; no masses,  no organomegaly Extremities: extremities normal, atraumatic, no cyanosis or edema  Lab Results: Recent Labs     06/24/19 0438 06/25/19 0305 06/26/19 0850  WBC 10.5 7.8 8.6  HGB 8.6* 8.1* 8.3*  HCT 27.6* 25.5* 27.0*  PLT 424* 417* 458*   BMET Recent Labs    06/24/19 1142 06/25/19 0305 06/26/19 0850  NA 152* 150* 149*  K 4.1 3.7 3.6  CL 114* 117* 113*  CO2 24 25 24   GLUCOSE 114* 118* 159*  BUN 41* 35* 23  CREATININE 2.00* 1.96* 1.88*  CALCIUM 9.9 9.4 9.1   LFT Recent Labs    06/26/19 0850  PROT 6.8  ALBUMIN 2.2*  AST 63*  ALT 57*  ALKPHOS 768*  BILITOT 2.7*   Medications: I have reviewed the patient's current medications.  Assessment/Plan: 1) Abnormal LFTs with significant increase in alkaline phosphatase-729 today down from 800 yesterday.. I feel these abnormalities are due to multiple medical issues.  LFTs seem to be improving slowly we will sign off please call as needed. 2) Severe sepsis with recent multidrug-resistant E. coli UTI post extubation aspiration pneumonia. 3) Acute encephalopathy probably multifactorial. 4) History of alcohol abuse. 5) Acute kidney injury on chronic kidney disease. 6) Acute hypoxemic respiratory failure with aspiration pneumonia post extubation.   LOS: 14 days   Juanita Craver 06/26/2019, 2:09 PM

## 2019-06-27 LAB — CBC
HCT: 26.8 % — ABNORMAL LOW (ref 39.0–52.0)
Hemoglobin: 8.3 g/dL — ABNORMAL LOW (ref 13.0–17.0)
MCH: 28.9 pg (ref 26.0–34.0)
MCHC: 31 g/dL (ref 30.0–36.0)
MCV: 93.4 fL (ref 80.0–100.0)
Platelets: 453 10*3/uL — ABNORMAL HIGH (ref 150–400)
RBC: 2.87 MIL/uL — ABNORMAL LOW (ref 4.22–5.81)
RDW: 15.2 % (ref 11.5–15.5)
WBC: 9.4 10*3/uL (ref 4.0–10.5)
nRBC: 0 % (ref 0.0–0.2)

## 2019-06-27 LAB — GLUCOSE, CAPILLARY
Glucose-Capillary: 204 mg/dL — ABNORMAL HIGH (ref 70–99)
Glucose-Capillary: 160 mg/dL — ABNORMAL HIGH (ref 70–99)
Glucose-Capillary: 125 mg/dL — ABNORMAL HIGH (ref 70–99)
Glucose-Capillary: 238 mg/dL — ABNORMAL HIGH (ref 70–99)
Glucose-Capillary: 118 mg/dL — ABNORMAL HIGH (ref 70–99)
Glucose-Capillary: 129 mg/dL — ABNORMAL HIGH (ref 70–99)

## 2019-06-27 LAB — RENAL FUNCTION PANEL
Albumin: 2.3 g/dL — ABNORMAL LOW (ref 3.5–5.0)
Anion gap: 12 (ref 5–15)
BUN: 19 mg/dL (ref 8–23)
CO2: 20 mmol/L — ABNORMAL LOW (ref 22–32)
Calcium: 9.1 mg/dL (ref 8.9–10.3)
Chloride: 109 mmol/L (ref 98–111)
Creatinine, Ser: 1.99 mg/dL — ABNORMAL HIGH (ref 0.61–1.24)
GFR calc Af Amer: 41 mL/min — ABNORMAL LOW (ref >60)
GFR calc non Af Amer: 35 mL/min — ABNORMAL LOW (ref >60)
Glucose, Bld: 140 mg/dL — ABNORMAL HIGH (ref 70–99)
Phosphorus: 3.9 mg/dL (ref 2.5–4.6)
Potassium: 4 mmol/L (ref 3.5–5.1)
Sodium: 141 mmol/L (ref 135–145)

## 2019-06-27 LAB — KAPPA/LAMBDA LIGHT CHAINS
Kappa free light chain: 110.9 mg/L — ABNORMAL HIGH (ref 3.3–19.4)
Kappa, lambda light chain ratio: 1.27 (ref 0.26–1.65)
Lambda free light chains: 87.3 mg/L — ABNORMAL HIGH (ref 5.7–26.3)

## 2019-06-27 LAB — CULTURE, BLOOD (ROUTINE X 2)
Culture: NO GROWTH
Culture: NO GROWTH

## 2019-06-27 LAB — HEPATIC FUNCTION PANEL
ALT: 55 U/L — ABNORMAL HIGH (ref 0–44)
AST: 71 U/L — ABNORMAL HIGH (ref 15–41)
Albumin: 2.3 g/dL — ABNORMAL LOW (ref 3.5–5.0)
Alkaline Phosphatase: 713 U/L — ABNORMAL HIGH (ref 38–126)
Bilirubin, Direct: 1.1 mg/dL — ABNORMAL HIGH (ref 0.0–0.2)
Indirect Bilirubin: 1.2 mg/dL — ABNORMAL HIGH (ref 0.3–0.9)
Total Bilirubin: 2.3 mg/dL — ABNORMAL HIGH (ref 0.3–1.2)
Total Protein: 6.9 g/dL (ref 6.5–8.1)

## 2019-06-27 LAB — PROTEIN ELECTROPHORESIS, SERUM
A/G Ratio: 0.6 — ABNORMAL LOW (ref 0.7–1.7)
Albumin ELP: 2.7 g/dL — ABNORMAL LOW (ref 2.9–4.4)
Alpha-1-Globulin: 0.5 g/dL — ABNORMAL HIGH (ref 0.0–0.4)
Alpha-2-Globulin: 1.3 g/dL — ABNORMAL HIGH (ref 0.4–1.0)
Beta Globulin: 1.2 g/dL (ref 0.7–1.3)
Gamma Globulin: 1.3 g/dL (ref 0.4–1.8)
Globulin, Total: 4.2 g/dL — ABNORMAL HIGH (ref 2.2–3.9)
Total Protein ELP: 6.9 g/dL (ref 6.0–8.5)

## 2019-06-27 LAB — C4 COMPLEMENT: Complement C4, Body Fluid: 66 mg/dL — ABNORMAL HIGH (ref 12–38)

## 2019-06-27 LAB — C3 COMPLEMENT: C3 Complement: 238 mg/dL — ABNORMAL HIGH (ref 82–167)

## 2019-06-27 LAB — GLOMERULAR BASEMENT MEMBRANE ANTIBODIES: GBM Ab: 5 units (ref 0–20)

## 2019-06-27 LAB — COMPLEMENT, TOTAL: Compl, Total (CH50): >60 U/mL (ref >41)

## 2019-06-27 LAB — MAGNESIUM: Magnesium: 2.1 mg/dL (ref 1.7–2.4)

## 2019-06-27 LAB — ANTINUCLEAR ANTIBODIES, IFA: ANA Ab, IFA: NEGATIVE

## 2019-06-27 LAB — OCCULT BLOOD X 1 CARD TO LAB, STOOL: Fecal Occult Bld: NEGATIVE

## 2019-06-27 LAB — MPO/PR-3 (ANCA) ANTIBODIES
ANCA Proteinase 3: <3.5 U/mL (ref 0.0–3.5)
Myeloperoxidase Abs: <9 U/mL (ref 0.0–9.0)

## 2019-06-27 LAB — ANTI-DNA ANTIBODY, DOUBLE-STRANDED: ds DNA Ab: <1 IU/mL (ref 0–9)

## 2019-06-27 MED ORDER — PRO-STAT SUGAR FREE PO LIQD
30.0000 mL | Freq: Every day | ORAL | Status: DC
  Administered 2019-06-27 – 2019-06-29 (×3): 30 mL via ORAL
  Filled 2019-06-27 (×3): qty 30

## 2019-06-27 MED ORDER — CLONAZEPAM 0.5 MG PO TABS
0.2500 mg | ORAL_TABLET | Freq: Two times a day (BID) | ORAL | Status: DC
  Administered 2019-06-27: 0.25 mg via ORAL
  Filled 2019-06-27: qty 1

## 2019-06-27 MED ORDER — CLONAZEPAM 0.25 MG PO TBDP
0.2500 mg | ORAL_TABLET | Freq: Two times a day (BID) | ORAL | Status: DC
  Administered 2019-06-27 – 2019-06-29 (×4): 0.25 mg via ORAL
  Filled 2019-06-27 (×4): qty 1

## 2019-06-27 MED ORDER — ENSURE ENLIVE PO LIQD
237.0000 mL | Freq: Two times a day (BID) | ORAL | Status: DC
  Administered 2019-06-27 – 2019-06-29 (×5): 237 mL via ORAL

## 2019-06-27 NOTE — Progress Notes (Addendum)
Nephrology Progress Note:   Patient ID: Scott Mcgee, male   DOB: 1960/09/13, 59 y.o.   MRN: [id number]   S:  Mental status has seemed better.  Had 600 mL UOP over 2/22 charted as well as an additional void.  Now has condom cath on   Review of systems:    Denies shortness of breath  Denies n/v  No cp  O:BP 119/70 (BP Location: Right Arm)   Pulse 75   Temp 98.9 F (37.2 C) (Oral)   Resp (!) 31   Ht 5' 6"  (1.676 m)   Wt 73.2 kg   SpO2 (!) 86%   BMI 26.05 kg/m   Intake/Output Summary (Last 24 hours) at 06/27/2019 1023 Last data filed at 06/27/2019 0303 Gross per 24 hour  Intake -  Output 600 ml  Net -600 ml   Intake/Output: I/O last 3 completed shifts: In: -  Out: 600 [Urine:600]  Intake/Output this shift:  No intake/output data recorded. Weight change:    Physical exam:   Gen: adult male seated NAD CVS:  S1s2 no rub Resp: clear and unlabored at rest on room air - oxygen is off Abd: distended, soft, NT Ext: no edema appreciated; no clubbing or cyanosis  Neuro awake and able to converse; oriented to person, year, and location  Psych - limited insight; no agitation on exam   Recent Labs  Lab 06/21/19 0853 06/22/19 0321 06/22/19 1046 06/23/19 0430 06/24/19 0416 06/24/19 1142 06/25/19 0305 06/26/19 0850 06/27/19 0207 06/27/19 0732  NA 154*   < > 150* 153* 152* 152* 150* 149* 141  --   K 4.2   < > 4.6 4.8 5.8* 4.1 3.7 3.6 4.0  --   CL 112*   < > 112* 113* 112* 114* 117* 113* 109  --   CO2 29   < > 24 27 26 24 25 24  20*  --   GLUCOSE 215*   < > 104* 136* 100* 114* 118* 159* 140*  --   BUN 53*   < > 53* 56* 41* 41* 35* 23 19  --   CREATININE 1.51*   < > 2.02* 2.35* 2.05* 2.00* 1.96* 1.88* 1.99*  --   ALBUMIN 2.0*  --  2.5* 2.2* 2.2*  --  2.1* 2.2* 2.3* 2.3*  CALCIUM 9.3   < > 9.7 9.5 9.3 9.9 9.4 9.1 9.1  --   PHOS  --   --   --   --   --  4.7* 4.3 3.7 3.9  --   AST 94*  --  89* 79* 99*  --  87* 63*  --  71*  ALT 113*  --  99* 78* 70*  --  68* 57*  --  55*   < > = values in this interval not displayed.   Liver Function Tests: Recent Labs  Lab 06/25/19 0305 06/25/19 0305 06/26/19 0850 06/27/19 0207 06/27/19 0732  AST 87*  --  63*  --  71*  ALT 68*  --  57*  --  55*  ALKPHOS 800*  --  768*  --  713*  BILITOT 3.9*  --  2.7*  --  2.3*  PROT 6.6  --  6.8  --  6.9  ALBUMIN 2.1*   < > 2.2* 2.3* 2.3*   < > = values in this interval not displayed.   CBC: Recent Labs  Lab 06/23/19 6314 06/23/19 9702 06/24/19 6378 06/24/19 5885 06/25/19 0305 06/26/19 0850 06/27/19 0277  WBC 14.0*   < > 10.5   < > 7.8 8.6 9.4  NEUTROABS 10.4*   < > 7.6  --  5.1 5.7  --   HGB 8.1*   < > 8.6*   < > 8.1* 8.3* 8.3*  HCT 26.3*   < > 27.6*   < > 25.5* 27.0* 26.8*  MCV 94.3  --  93.9  --  92.7 94.7 93.4  PLT 403*   < > 424*   < > 417* 458* 453*   < > = values in this interval not displayed.   Cardiac Enzymes: No results for input(s): CKTOTAL, CKMB, CKMBINDEX, TROPONINI in the last 168 hours. CBG: Recent Labs  Lab 06/26/19 1547 06/26/19 1912 06/26/19 2323 06/27/19 0300 06/27/19 0804  GLUCAP 144* 222* 116* 204* 160*   Studies/Results: No results found. . chlorhexidine gluconate (MEDLINE KIT)  15 mL Mouth Rinse BID  . Chlorhexidine Gluconate Cloth  6 each Topical Daily  . clonazePAM  0.25 mg Oral BID  . feeding supplement  1 Container Oral TID BM  . folic acid  1 mg Oral Daily  . Gerhardt's butt cream   Topical BID  . heparin injection (subcutaneous)  5,000 Units Subcutaneous Q8H  . insulin aspart  0-15 Units Subcutaneous TID AC & HS  . mouth rinse  15 mL Mouth Rinse BID  . multivitamin with minerals  1 tablet Oral Daily  . nystatin  5 mL Oral QID  . polyethylene glycol  17 g Oral Daily  . senna-docusate  1 tablet Oral BID  . thiamine  100 mg Oral Daily   Or  . thiamine  100 mg Intravenous Daily    BMET    Component Value Date/Time   NA 141 06/27/2019 0207   K 4.0 06/27/2019 0207   CL 109 06/27/2019 0207   CO2 20 (L) 06/27/2019 0207    GLUCOSE 140 (H) 06/27/2019 0207   BUN 19 06/27/2019 0207   CREATININE 1.99 (H) 06/27/2019 0207   CALCIUM 9.1 06/27/2019 0207   GFRNONAA 35 (L) 06/27/2019 0207   GFRAA 41 (L) 06/27/2019 0207   CBC    Component Value Date/Time   WBC 9.4 06/27/2019 0207   RBC 2.87 (L) 06/27/2019 0207   HGB 8.3 (L) 06/27/2019 0207   HCT 26.8 (L) 06/27/2019 0207   PLT 453 (H) 06/27/2019 0207   MCV 93.4 06/27/2019 0207   MCH 28.9 06/27/2019 0207   MCHC 31.0 06/27/2019 0207   RDW 15.2 06/27/2019 0207   LYMPHSABS 2.0 06/26/2019 0850   MONOABS 0.5 06/26/2019 0850   EOSABS 0.4 06/26/2019 0850   BASOSABS 0.1 06/26/2019 0850    Assessment/Plan: 1. AKI/CKD stage 3- in setting of recent urosepsis now with decreased UOP, hypotension and worsening renal function. No nephrotoxic agents noted. Pt not able to drink due to AMS so intravascular volume depletion may play a role, however he has also been on abx so AIN is also on DDx as well as ischemic ATN due to relative hypotension. UA 0-5 RBC and 89m/dL protein.  Given AMS and recurrent AKI acute GN/vasculitis workup was initiated. HIV nonreactive.  Complement acceptable.  No urine eos - pending work-up includes: anti-GBM pending, ASO pending, anti-dsDNA pending, ANA pending, ANCA pending - SPEP and free light chains pending and urine immunofixation reported as ordered   - Check post-void residual bladder scan - place foley if retention 2. Hyperkalemia- s/p lokelma; resolved 3. Acute metabolic encephalopathy- possible alcohol withdrawal but has been treated  with benzos.  Work-up per primary team; delirium as well  4. Abnormal LFT's- elevated GGT and alk phos.  GI has signed off.     5. Abdominal distension- no ascites, may be due to intraperitoneal fat. 6. Hypernatremia- due to inability to drink. improved with free water repletion; s/p d5w   7. Diastolic CHF- stable volume  8. Anemia of critical illness- cont to follow; no acute indication for PRBC's. May need  ESA.  Reports hx prostate cancer a few years ago - no current malignancy.   Claudia Desanctis, MD 06/27/2019 10:39 AM

## 2019-06-27 NOTE — Progress Notes (Signed)
Physical Therapy Treatment Patient Details Name: Scott Mcgee MRN: [id number] DOB: Jan 15, 1961 Today's Date: 06/27/2019    History of Present Illness Pt is 59 yo male with PMH of DM, HTN, and prostate CA.  Admitted on 2/8 with dysuria and fever.  Found to be hyperglycemic, hyponatremic, and positive UTI with sepsis. On 2/9 pt with resp failure and required intubation.  EEG on 2/13 showed profound diffuse encephalopathy.  Pt was extubated on 2/17.    PT Comments    Pt admitted with above diagnosis. Pt was able to ambulate with RW forward and back 5 feet with min guard assist.  Pt progressing well.  Still impulsive at times but can redirect pt.   Pt currently with functional limitations due to balance and endurance deficits. Pt will benefit from skilled PT to increase their independence and safety with mobility to allow discharge to the venue listed below.     Follow Up Recommendations  SNF     Equipment Recommendations  Other (comment)(defer)    Recommendations for Other Services       Precautions / Restrictions Precautions Precautions: Fall Restrictions Weight Bearing Restrictions: No    Mobility  Bed Mobility Overal bed mobility: Needs Assistance Bed Mobility: Supine to Sit     Supine to sit: Min assist;+2 for safety/equipment     General bed mobility comments: assist for LEs over EOB and to raise trunk  Transfers Overall transfer level: Needs assistance Equipment used: Rolling walker (2 wheeled) Transfers: Sit to/from [facility] Sit to Stand: From elevated surface;Min assist;+2 safety/equipment Stand pivot transfers: +2 safety/equipment;Total assist;From elevated surface(used Stedy initially)       General transfer comment: Pt stood to [place] with very little assist. Sat on Stedy and was moved to sink where he stood to brush teeth.  then pt was moved in [place] back to the chair where he then stood to RW needing mod assist and cues initailly but  progressed to min assist to stand.  Initially c/o LEs feeling weak. also neednig cues for hand placement.    Ambulation/Gait Ambulation/Gait assistance: Min guard;Min assist;+2 safety/equipment Gait Distance (Feet): 10 Feet(5 feet forward and 5 feet back) Assistive device: Rolling walker (2 wheeled) Gait Pattern/deviations: Step-through pattern;Decreased stride length;Shuffle   Gait velocity interpretation: <1.31 ft/sec, indicative of household ambulator General Gait Details: Pt was able to ambulate with RW 10 feet total with cues and min guard assist. Slightly unsteady at times.    Stairs             Wheelchair Mobility    Modified Rankin (Stroke Patients Only)       Balance Overall balance assessment: Needs assistance Sitting-balance support: Bilateral upper extremity supported;Feet supported Sitting balance-Leahy Scale: Poor Sitting balance - Comments: min guard assist sitting EOB   Standing balance support: During functional activity;Bilateral upper extremity supported Standing balance-Leahy Scale: Poor Standing balance comment: relies on ue support                            Cognition Arousal/Alertness: Awake/alert Behavior During Therapy: Restless;Impulsive Overall Cognitive Status: Impaired/Different from baseline Area of Impairment: Orientation;Attention;Memory;Following commands;Safety/judgement;Awareness;Problem solving                 Orientation Level: Disoriented to;Place;Time;Situation Current Attention Level: Focused Memory: Decreased recall of precautions Following Commands: Follows one step commands inconsistently Safety/Judgement: Decreased awareness of safety;Decreased awareness of deficits Awareness: Intellectual Problem Solving: Difficulty sequencing;Requires verbal cues;Requires tactile cues;Slow  processing General Comments: Pt needed frequent reminders that he had catheter to urinate in as well as that he should not pick at  his IV.  Kept asking to take the restraints off and the lines.  He did disconnect the heart monitor at one point as well .       Exercises      General Comments        Pertinent Vitals/Pain Pain Assessment: No/denies pain    Home Living                      Prior Function            PT Goals (current goals can now be found in the care plan section) Acute Rehab PT Goals Patient Stated Goal: unable Progress towards PT goals: Progressing toward goals    Frequency    Min 2X/week      PT Plan Current plan remains appropriate    Co-evaluation PT/OT/SLP Co-Evaluation/Treatment: Yes Reason for Co-Treatment: Complexity of the patient's impairments (multi-system involvement);For patient/therapist safety PT goals addressed during session: Mobility/safety with mobility        AM-PAC PT "6 Clicks" Mobility   Outcome Measure  Help needed turning from your back to your side while in a flat bed without using bedrails?: A Little Help needed moving from lying on your back to sitting on the side of a flat bed without using bedrails?: A Little Help needed moving to and from a bed to a chair (including a wheelchair)?: A Lot Help needed standing up from a chair using your arms (e.g., wheelchair or bedside chair)?: A Little Help needed to walk in hospital room?: A Little Help needed climbing 3-5 steps with a railing? : A Lot 6 Click Score: 16    End of Session Equipment Utilized During Treatment: Gait belt Activity Tolerance: Patient tolerated treatment well Patient left: with chair alarm set;in chair;with call bell/phone within reach Nurse Communication: Mobility status;Need for lift equipment PT Visit Diagnosis: Unsteadiness on feet (R26.81);Muscle weakness (generalized) (M62.81)     Time: 8338-2505 PT Time Calculation (min) (ACUTE ONLY): 24 min  Charges:  $Gait Training: 8-22 mins                     Scott Mcgee,PT [place] Pager:   [phone]  Office:  [place] 06/27/2019, 11:48 AM

## 2019-06-27 NOTE — Progress Notes (Signed)
PROGRESS NOTE    Scott Mcgee    Code Status: Full Code  KTG:[id number] DOB: 06-22-1960 DOA: 06/12/2019 LOS: 15 days  PCP: [facility]:  Chief Complaint  Patient presents with  . [facility] Summary  This is a 59 year old male with past medical history of diabetes, hypertension, prostate cancer who was admitted on 2/8 with dysuria, low-grade fevers for several days as well as dysuria x2 weeks.  To have hyperglycemia, hyponatremia and elevated creatinine ED UA positive for UTI.  CT abdomen/pelvis with stranding around kidneys without other abnormalities.  Started on insulin drip and IV fluids.  PCCM consulted on 2/9 for acute hypoxemic respiratory failure and in respiratory distress and patient was subsequently intubated.  Meropenem changed to ceftriaxone on 2/10.  Change in mental status on 2/13 noted by PCCM with bradycardia and EEG was ordered and patient was further sedated.  EEG showed profound diffuse encephalopathy without specific etiology and no seizures or epileptiform discharges.  Patient was successfully extubated on 2/17.  Patient transferred to progressive floor and TRH resumed care 2/18.  2/18: Noted to have CIWA 17 early a.m. with frequent Ativan administration per CIWA protocol. Diaphoresis, Rigors noted with abdominal distention.  Ammonia level minimally elevated.  Started on lactulose.  Also started on D5W for hypernatremia and free water deficit.  Foley catheter placed due to urinary retention.  Repeat blood cultures ordered and US guided paracentesis ordered.  2/19: Nephrology consulted for worsening renal failure 2/20: Gi consulted for persistent elevated Alk Phos and GGT. MRI brain. Void trial 2/21: Cefepime held. CIWA discontinued. Clonazepam started for taper  A & P   Active Problems:   Type 2 diabetes mellitus with hyperosmolar hyperglycemic state (HHS) (HCC)   Hyponatremia   HTN (hypertension)   Acute renal failure  (HCC)   Abdominal distention   Alcohol use   Sepsis ([place])   Acute pyelonephritis   Acute respiratory failure ([place])   1. Severe sepsis with recent multidrug resistant E. coli UTI and suspected post extubation aspiration pneumonia Sepsis resolved a. 2 days meropenem followed by 7 days ceftriaxone in ICU (completed 2/16).  b. Started on Cefepime 2/19 due to sepsis unknown source, possibly aspiration (CXR 2/17 with slight worsening patchy opacity in right lung base), Procalcitonin 1.9 c. Cefepime stopped as below d. blood cultures, no growth to date 2. Acute encephalopathy, likely multifactorial: Sepsis, AKI, hepatic encephalopathy, hypoxemia, hypoglycemia, most notably hypernatremia and medication induced (ativan, Cefepime) a. Significant improvement today, not at baseline.  Oriented x1 person b. CT brain negative for acute abnormality c. EEG unremarkable for seizure d. MRI brain unremarkable e. Continue benzo taper with Clonazepam 0.5->0.25 mg bid and taper off  f. Discontinued seroquel g. Discontinued Cefepime 3. Hypernatremia due to poor p.o. intake a. Normalized with D5W b. SLP eval c. Discontinued D5W 4. 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 Initially improved, then Cr worsened again. Nephrology was consulted 2/19 b. Vasculitic workup per nephro unremarkable so far c. Myeloma workup pending d. Improving with fluids 5. Hypoglycemic episode a. Resolved with Dextrose 6. Hyperkalemia a. Resolved with Lokelma 7. Alcohol abuse a. Last drink was 10+ days ago (prior to admission), initial concern for alcohol withdrawal and patient received high amounts of Ativan due to this however CIWA protocol was discontinued as he was not thought to be withdrawing 2 weeks since last drink.  May have been withdrawing from Ativan from hospital stay.  Doing well  on benzo taper as above 8. Abdominal distention a. This is patient's normal body habitus (confirmed by radiology from CT scan and [place]) b. Continue  bowel regimen c. Ultrasound-guided paracentesis ordered, no ascites noted 9. Elevated LFTs a. Elevated ammonia improved with lactulose -discontinued for now b. Alk phos still elevated but improved today, GGT 653, elevated T bili improving c. RUQ ultrasound with hepatic steatosis, suspected cirrhosis and without cholelithiasis d. GI consulted, consider MRCP when stable  e. Significantly improved with discontinuation of medications 10. Acute hypoxemic respiratory failure, Initially volume overloaded leading to intubation, then suspected aspiration pneumonia post extubation a. Intubated 2/9-2/17, transferred to stepdown/TRH 2/18 b. Volume improved c. Completed 3 days cefepime for pneumonia, afebrile and with resolved leukocytosis d. Discontinued Cefepime as above, hold off on further antibiotics for now 11. Mild urinary retention, unknown etiology a. Foley catheter placed 2/18, discontinued 2/20 b. Bladder scan and straight cath protocol 12. HFpEF, compensated a. Stable volume today b. I/os and Daily weights 13. Normocytic anemia, suspect AoCD a. Differential unremarkable b. LDH and Haptoglobin normal 14. Thrush  a. Nystatin 15. Diabetes a. Continue current regimen   DVT prophylaxis: Subcu heparin Family Communication: Discussed with wife yesterday Disposition Plan:   Patient came from:  Home                                                                                          Anticipated d/c place: SNF  Barriers to d/c: Medical stability  Pressure injury documentation    None  Consultants  PCCM GI Nephro  Procedures  ETT 2/9-2/17 CT abd  2/8: Cholelithiasis without complicating factors. Mild perinephric stranding is noted on the left without obstructive change. Underlying UTI could not be totally excluded. Mild bibasilar atelectasis. 2/9 echo: LVEF 60-65%, LVH 2/13 EEG:   Antibiotics   Anti-infectives (From admission, onward)   Start     Dose/Rate Route  Frequency Ordered Stop   06/23/19 1900  ceFEPIme (MAXIPIME) 2 g in sodium chloride 0.9 % 100 mL IVPB  Status:  Discontinued     2 g 200 mL/hr over 30 Minutes Intravenous Every 12 hours 06/23/19 1815 06/25/19 1314   06/14/19 1830  cefTRIAXone (ROCEPHIN) 2 g in sodium chloride 0.9 % 100 mL IVPB     2 g 200 mL/hr over 30 Minutes Intravenous Every 24 hours 06/14/19 1020 06/20/19 1803   06/12/19 1830  meropenem (MERREM) 1 g in sodium chloride 0.9 % 100 mL IVPB  Status:  Discontinued     1 g 200 mL/hr over 30 Minutes Intravenous Every 12 hours 06/12/19 1818 06/14/19 1020   06/12/19 1745  cefTRIAXone (ROCEPHIN) 2 g in sodium chloride 0.9 % 100 mL IVPB  Status:  Discontinued     2 g 200 mL/hr over 30 Minutes Intravenous Every 24 hours 06/12/19 1743 06/12/19 1814        Subjective   More interactive today and resting comfortably.  Agitated overnight and per nursing, patient tried to kick nurse again and placed in restraints.  Currently no complaints  Objective   Vitals:   06/26/19 2317 06/27/19 0301 06/27/19 0806 06/27/19 0900  BP: Marland Kitchen)  97/58  119/70   Pulse: 72  75   Resp: 20  (!) 31   Temp: 98.2 F (36.8 C) 98.6 F (37 C) 98.9 F (37.2 C)   TempSrc: Oral Oral Oral   SpO2: 90%  (!) 86% 95%  Weight:  73.2 kg    Height:        Intake/Output Summary (Last 24 hours) at 06/27/2019 1226 Last data filed at 06/27/2019 1100 Gross per 24 hour  Intake --  Output 850 ml  Net -850 ml   Filed Weights   06/21/19 0456 06/24/19 0410 06/27/19 0301  Weight: 77 kg 75.2 kg 73.2 kg    Examination:  Physical Exam Vitals and nursing note reviewed.  Constitutional:      General: He is not in acute distress. HENT:     Head: Normocephalic.     Mouth/Throat:     Comments: Thrush Eyes:     Conjunctiva/sclera: Conjunctivae normal.  Cardiovascular:     Rate and Rhythm: Normal rate and regular rhythm.  Pulmonary:     Effort: Pulmonary effort is normal.     Breath sounds: Normal breath sounds.   Abdominal:     Comments: Baseline distention  Musculoskeletal:        General: No swelling or tenderness.  Neurological:     Mental Status: He is alert. He is disoriented.     Comments: Oriented x1 to person  Psychiatric:        Mood and Affect: Mood normal.        Behavior: Behavior normal.      Data Reviewed: I have personally reviewed following labs and imaging studies  CBC: Recent Labs  Lab 06/23/19 0430 06/23/19 0430 06/23/19 0953 06/24/19 0438 06/25/19 0305 06/26/19 0850 06/27/19 0207  WBC 15.3*   < > 14.0* 10.5 7.8 8.6 9.4  NEUTROABS 11.8*  --  10.4* 7.6 5.1 5.7  --   HGB 8.2*   < > 8.1* 8.6* 8.1* 8.3* 8.3*  HCT 26.3*   < > 26.3* 27.6* 25.5* 27.0* 26.8*  MCV 92.3   < > 94.3 93.9 92.7 94.7 93.4  PLT 379   < > 403* 424* 417* 458* 453*   < > = values in this interval not displayed.   Basic Metabolic Panel: Recent Labs  Lab 06/23/19 0430 06/23/19 0430 06/24/19 0416 06/24/19 1142 06/25/19 0305 06/26/19 0850 06/27/19 0207  NA 153*   < > 152* 152* 150* 149* 141  K 4.8   < > 5.8* 4.1 3.7 3.6 4.0  CL 113*   < > 112* 114* 117* 113* 109  CO2 27   < > 26 24 25 24  20*  GLUCOSE 136*   < > 100* 114* 118* 159* 140*  BUN 56*   < > 41* 41* 35* 23 19  CREATININE 2.35*   < > 2.05* 2.00* 1.96* 1.88* 1.99*  CALCIUM 9.5   < > 9.3 9.9 9.4 9.1 9.1  MG 2.4  --   --  2.4  --  2.3 2.1  PHOS  --   --   --  4.7* 4.3 3.7 3.9   < > = values in this interval not displayed.   GFR: Estimated Creatinine Clearance: 35.2 mL/min (A) (by C-G formula based on SCr of 1.99 mg/dL (H)). Liver Function Tests: Recent Labs  Lab 06/23/19 0430 06/23/19 0430 06/24/19 0416 06/25/19 0305 06/26/19 0850 06/27/19 0207 06/27/19 0732  AST 79*  --  99* 87* 63*  --  71*  ALT 78*  --  70* 68* 57*  --  55*  ALKPHOS 759*  --  729* 800* 768*  --  713*  BILITOT 5.0*  --  4.9* 3.9* 2.7*  --  2.3*  PROT 6.6  --  6.7 6.6 6.8  --  6.9  ALBUMIN 2.2*   < > 2.2* 2.1* 2.2* 2.3* 2.3*   < > = values in this  interval not displayed.   No results for input(s): LIPASE, AMYLASE in the last 168 hours. Recent Labs  Lab 06/22/19 1046 06/23/19 0430 06/26/19 0850  AMMONIA 53* 40* 39*   Coagulation Profile: No results for input(s): INR, PROTIME in the last 168 hours. Cardiac Enzymes: No results for input(s): CKTOTAL, CKMB, CKMBINDEX, TROPONINI in the last 168 hours. BNP (last 3 results) No results for input(s): PROBNP in the last 8760 hours. HbA1C: No results for input(s): HGBA1C in the last 72 hours. CBG: Recent Labs  Lab 06/26/19 1547 06/26/19 1912 06/26/19 2323 06/27/19 0300 06/27/19 0804  GLUCAP 144* 222* 116* 204* 160*   Lipid Profile: No results for input(s): CHOL, HDL, LDLCALC, TRIG, CHOLHDL, LDLDIRECT in the last 72 hours. Thyroid Function Tests: No results for input(s): TSH, T4TOTAL, FREET4, T3FREE, THYROIDAB in the last 72 hours. Anemia Panel: No results for input(s): VITAMINB12, FOLATE, FERRITIN, TIBC, IRON, RETICCTPCT in the last 72 hours. Sepsis Labs: Recent Labs  Lab 06/22/19 1614 06/24/19 0437 06/24/19 1142  PROCALCITON 1.90  --   --   LATICACIDVEN  --  1.0 1.2    Recent Results (from the past 240 hour(s))  Culture, respiratory (non-expectorated)     Status: None   Collection Time: 06/21/19  9:37 AM   Specimen: Tracheal Aspirate; Respiratory  Result Value Ref Range Status   Specimen Description TRACHEAL ASPIRATE  Final   Special Requests Normal  Final   Gram Stain   Final    ABUNDANT WBC PRESENT, PREDOMINANTLY PMN NO ORGANISMS SEEN Performed at [facility], [address] [place], [place] [address]    Culture RARE CANDIDA ALBICANS  Final   Report Status 06/24/2019 FINAL  Final  Culture, blood (routine x 2)     Status: None   Collection Time: 06/22/19 10:42 AM   Specimen: BLOOD  Result Value Ref Range Status   Specimen Description BLOOD LEFT ANTECUBITAL  Final   Special Requests   Final    BOTTLES DRAWN AEROBIC AND ANAEROBIC Blood Culture  results may not be optimal due to an excessive volume of blood received in culture bottles   Culture   Final    NO GROWTH 5 DAYS Performed at [facility], [place] [address], [place], [place] [address]    Report Status 06/27/2019 FINAL  Final  Culture, blood (routine x 2)     Status: None   Collection Time: 06/22/19 10:47 AM   Specimen: BLOOD RIGHT HAND  Result Value Ref Range Status   Specimen Description BLOOD RIGHT HAND  Final   Special Requests   Final    BOTTLES DRAWN AEROBIC AND ANAEROBIC Blood Culture results may not be optimal due to an excessive volume of blood received in culture bottles   Culture   Final    NO GROWTH 5 DAYS Performed at [facility], [place] [address] [place], [place] [address]    Report Status 06/27/2019 FINAL  Final         Radiology Studies: No results found.      Scheduled Meds: .  chlorhexidine gluconate (MEDLINE KIT)  15 mL Mouth Rinse BID  . Chlorhexidine Gluconate Cloth  6 each Topical Daily  . clonazepam  0.25 mg Oral BID  . feeding supplement  1 Container Oral TID BM  . folic acid  1 mg Oral Daily  . Gerhardt's butt cream   Topical BID  . heparin injection (subcutaneous)  5,000 Units Subcutaneous Q8H  . insulin aspart  0-15 Units Subcutaneous TID AC & HS  . mouth rinse  15 mL Mouth Rinse BID  . multivitamin with minerals  1 tablet Oral Daily  . nystatin  5 mL Oral QID  . polyethylene glycol  17 g Oral Daily  . senna-docusate  1 tablet Oral BID  . thiamine  100 mg Oral Daily   Or  . thiamine  100 mg Intravenous Daily   Continuous Infusions: . sodium chloride 10 mL/hr at 06/21/19 1100     Time spent: 30 minutes with over 50% of the time coordinating the patient's care    Harold Hedge, DO [facility] Pager [phone]  Call night coverage person covering after 7pm

## 2019-06-27 NOTE — Progress Notes (Signed)
CSW attempted to contact patient's spouse but no voicemail available. No family in room. CSW will attempt again later.  Scott Locus Esmerelda Finnigan LCSW [phone]

## 2019-06-27 NOTE — Progress Notes (Signed)
Nutrition Follow-up  DOCUMENTATION CODES:   Not applicable  INTERVENTION:  Provide Ensure Enlive po BID, each supplement provides 350 kcal and 20 grams of protein  Provide 30 ml Prostat po once daily, each supplement provides 100 kcal and 15 grams of protein.   Encourage adequate PO intake.   NUTRITION DIAGNOSIS:   Inadequate oral intake related to lethargy/confusion as evidenced by meal completion < 50%; progressing  GOAL:   Patient will meet greater than or equal to 90% of their needs; progressing  MONITOR:   PO intake, Supplement acceptance, Diet advancement, Weight trends  REASON FOR ASSESSMENT:   Ventilator, Consult Enteral/tube feeding initiation and management  ASSESSMENT:   59 yo male admitted with dysuria, low grade fever, elevated blood sugars. Developed increasing SOB and abdominal distention and was intubated 2/9. PMH includes DM, HTN, prostate cancer. Extubated 2/17. Pt with severe sepsis with recent multidrug resistant E. coli UTI and suspected post extubation aspiration pneumonia. Acute encephalopathy, likely multifactorial: sepsis, AKI, hepatic encephalopathy, hypoxemia. RUQ ultrasound with hepatic steatosis, suspected cirrhosis and without cholelithiasis.  Pt is currently on a carbohydrate modified diet with thin liquids. Pt with need of assist with tray as mentation has been fluctuating. Pt currently has Boost Breeze ordered and has been consuming them. As diet has been advanced past liquids. RD to modify orders and order Ensure and Prostat to aid in caloric and protein needs.  Labs and medications reviewed. Alkaline phosphatase elevated at 713.   Diet Order:   Diet Order            Diet Carb Modified Fluid consistency: Thin; Room service appropriate? Yes  Diet effective now              EDUCATION NEEDS:   Not appropriate for education at this time  Skin:  Skin Assessment: Reviewed RN Assessment  Last BM:  2/21  Height:   Ht Readings from  Last 1 Encounters:  06/13/19 5\' 6"  (1.676 m)    Weight:   Wt Readings from Last 1 Encounters:  06/27/19 73.2 kg   BMI:  Body mass index is 26.05 kg/m.  Estimated Nutritional Needs:   Kcal:  1950-2150  Protein:  110-130 gm  Fluid:  >/= 2 L    Corrin Parker, MS, RD, LDN RD pager number/after hours weekend pager number on Amion.

## 2019-06-27 NOTE — Progress Notes (Addendum)
Occupational Therapy Treatment Patient Details Name: Scott Mcgee MRN: [id number] DOB: 1961-04-30 Today's Date: 06/27/2019    History of present illness Pt is 59 yo male with PMH of DM, HTN, and prostate CA.  Admitted on 2/8 with dysuria and fever.  Found to be hyperglycemic, hyponatremic, and positive UTI with sepsis. On 2/9 pt with resp failure and required intubation.  EEG on 2/13 showed profound diffuse encephalopathy.  Pt was extubated on 2/17.   OT comments  Pt progressing towards established OT goals. With use of stedy for balance and seated breaks, pt performing oral care at sink with Min Guard-Min A. Pt requiting Mod-Max cues throughout for sequencing, attention, and safety. Pt requiring Mind A +2 for sit<>Stand transfer first using stedy and then RW. Pt continues to present with decreased cognition, safety, balance, and activity tolerance. VSS on 2L throughout. Continue to recommend dc to SNF and will continue to follow acutely as admitted.   Follow Up Recommendations  SNF;Supervision/Assistance - 24 hour    Equipment Recommendations  Other (comment)(Defer to next venue)    Recommendations for Other Services Speech consult    Precautions / Restrictions Precautions Precautions: Fall Restrictions Weight Bearing Restrictions: No       Mobility Bed Mobility Overal bed mobility: Needs Assistance Bed Mobility: Supine to Sit     Supine to sit: Min assist;+2 for safety/equipment     General bed mobility comments: assist for LEs over EOB and to raise trunk  Transfers Overall transfer level: Needs assistance Equipment used: Rolling walker (2 wheeled) Transfers: Sit to/from [facility] Sit to Stand: From elevated surface;Min assist;+2 safety/equipment Stand pivot transfers: +2 safety/equipment;Total assist;From elevated surface(used Stedy initially)       General transfer comment: Pt using stedy for initial sit<>Stand with Min A for power up and to  gain balance. Pt then using RW with Min A +2.     Balance Overall balance assessment: Needs assistance Sitting-balance support: Bilateral upper extremity supported;Feet supported Sitting balance-Leahy Scale: Poor Sitting balance - Comments: min guard assist sitting EOB   Standing balance support: During functional activity;Bilateral upper extremity supported Standing balance-Leahy Scale: Poor Standing balance comment: relies on ue support                           ADL either performed or assessed with clinical judgement   ADL Overall ADL's : Needs assistance/impaired     Grooming: Oral care;Min guard;Minimal assistance;Sitting;Standing Grooming Details (indicate cue type and reason): Pt performing oral care while seated/stand with stedy at sink. Min Guard-Min A for balance and safety. Pt requiring cues for initation and termination of task.                Lower Body Dressing Details (indicate cue type and reason): Pt using figure fourth method to done right sock while seated at EOB with close Min guard A. Min A for balance in standing Toilet Transfer: Minimal assistance;+2 for physical assistance;Ambulation;RW;+2 for safety/equipment;Cueing for sequencing;Cueing for safety(simulated to recliner)           Functional mobility during ADLs: Minimal assistance;+2 for physical assistance;+2 for safety/equipment;Rolling walker(stedy)       Vision       Perception     Praxis      Cognition Arousal/Alertness: Awake/alert Behavior During Therapy: Restless;Impulsive Overall Cognitive Status: Impaired/Different from baseline Area of Impairment: Orientation;Attention;Memory;Following commands;Safety/judgement;Awareness;Problem solving  Orientation Level: Disoriented to;Place;Time;Situation Current Attention Level: Focused Memory: Decreased recall of precautions;Decreased short-term memory Following Commands: Follows one step commands  inconsistently Safety/Judgement: Decreased awareness of safety;Decreased awareness of deficits Awareness: Intellectual Problem Solving: Difficulty sequencing;Requires verbal cues;Requires tactile cues;Slow processing General Comments: Pt presenting with decreased attention and quickly getting distracted both externally and internally. Pt requiting increased cues and time for following simple commands. Pt with very poor awareness of deficits and safety        Exercises     Shoulder Instructions       General Comments SpO2 maintaining in 90s on 2L.    Pertinent Vitals/ Pain       Pain Assessment: No/denies pain  Home Living                                          Prior Functioning/Environment              Frequency  Min 2X/week        Progress Toward Goals  OT Goals(current goals can now be found in the care plan section)  Progress towards OT goals: Progressing toward goals  Acute Rehab OT Goals Patient Stated Goal: unable OT Goal Formulation: Patient unable to participate in goal setting Time For Goal Achievement: 07/07/19 Potential to Achieve Goals: Good ADL Goals Pt Will Perform Grooming: with mod assist;sitting Pt Will Transfer to Toilet: with mod assist;stand pivot transfer Additional ADL Goal #1: Pt will perform bed mobility with moderate assistance in preparation for ADL. Additional ADL Goal #2: Pt will sit EOB with fair sitting balance in preparation for ADL. Additional ADL Goal #3: Pt will follow commands with 50% accuracy. Additional ADL Goal #4: Pt will utilize call button appropriately to request assistance.  Plan Discharge plan remains appropriate    Co-evaluation    PT/OT/SLP Co-Evaluation/Treatment: Yes Reason for Co-Treatment: Complexity of the patient's impairments (multi-system involvement);For patient/therapist safety;To address functional/ADL transfers PT goals addressed during session: Mobility/safety with mobility OT  goals addressed during session: ADL's and self-care      AM-PAC OT "6 Clicks" Daily Activity     Outcome Measure   Help from another person eating meals?: A Little Help from another person taking care of personal grooming?: A Little Help from another person toileting, which includes using toliet, bedpan, or urinal?: A Lot Help from another person bathing (including washing, rinsing, drying)?: A Lot Help from another person to put on and taking off regular upper body clothing?: A Little Help from another person to put on and taking off regular lower body clothing?: A Little 6 Click Score: 16    End of Session Equipment Utilized During Treatment: Gait belt;Rolling walker;Other (comment);Oxygen(Stedy)  OT Visit Diagnosis: Unsteadiness on feet (R26.81);Muscle weakness (generalized) (M62.81);Other symptoms and signs involving cognitive function   Activity Tolerance Patient tolerated treatment well   Patient Left in chair;with call bell/phone within reach;with chair alarm set;with nursing/sitter in room(with NT for eating breakfast)   Nurse Communication Mobility status        Time: 1275-1700 OT Time Calculation (min): 23 min  Charges: OT General Charges $OT Visit: 1 Visit OT Treatments $Self Care/Home Management : 8-22 mins  [place], OTR/L Acute Rehab Pager: [phone] Office: [place] 06/27/2019, 12:57 PM
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
## 2019-06-28 LAB — COMPREHENSIVE METABOLIC PANEL
ALT: 45 U/L — ABNORMAL HIGH (ref 0–44)
AST: 57 U/L — ABNORMAL HIGH (ref 15–41)
Albumin: 2.2 g/dL — ABNORMAL LOW (ref 3.5–5.0)
Alkaline Phosphatase: 659 U/L — ABNORMAL HIGH (ref 38–126)
Anion gap: 10 (ref 5–15)
BUN: 13 mg/dL (ref 8–23)
CO2: 22 mmol/L (ref 22–32)
Calcium: 9.1 mg/dL (ref 8.9–10.3)
Chloride: 110 mmol/L (ref 98–111)
Creatinine, Ser: 1.88 mg/dL — ABNORMAL HIGH (ref 0.61–1.24)
GFR calc Af Amer: 44 mL/min — ABNORMAL LOW (ref >60)
GFR calc non Af Amer: 38 mL/min — ABNORMAL LOW (ref >60)
Glucose, Bld: 136 mg/dL — ABNORMAL HIGH (ref 70–99)
Potassium: 3.7 mmol/L (ref 3.5–5.1)
Sodium: 142 mmol/L (ref 135–145)
Total Bilirubin: 1.9 mg/dL — ABNORMAL HIGH (ref 0.3–1.2)
Total Protein: 6.7 g/dL (ref 6.5–8.1)

## 2019-06-28 LAB — GLUCOSE, CAPILLARY
Glucose-Capillary: 124 mg/dL — ABNORMAL HIGH (ref 70–99)
Glucose-Capillary: 121 mg/dL — ABNORMAL HIGH (ref 70–99)
Glucose-Capillary: 150 mg/dL — ABNORMAL HIGH (ref 70–99)
Glucose-Capillary: 141 mg/dL — ABNORMAL HIGH (ref 70–99)
Glucose-Capillary: 130 mg/dL — ABNORMAL HIGH (ref 70–99)
Glucose-Capillary: 118 mg/dL — ABNORMAL HIGH (ref 70–99)

## 2019-06-28 LAB — CBC
HCT: 26.8 % — ABNORMAL LOW (ref 39.0–52.0)
Hemoglobin: 8.5 g/dL — ABNORMAL LOW (ref 13.0–17.0)
MCH: 29 pg (ref 26.0–34.0)
MCHC: 31.7 g/dL (ref 30.0–36.0)
MCV: 91.5 fL (ref 80.0–100.0)
Platelets: 418 10*3/uL — ABNORMAL HIGH (ref 150–400)
RBC: 2.93 MIL/uL — ABNORMAL LOW (ref 4.22–5.81)
RDW: 15 % (ref 11.5–15.5)
WBC: 8.8 10*3/uL (ref 4.0–10.5)
nRBC: 0 % (ref 0.0–0.2)

## 2019-06-28 LAB — PHOSPHORUS: Phosphorus: 4.3 mg/dL (ref 2.5–4.6)

## 2019-06-28 LAB — MAGNESIUM: Magnesium: 2.1 mg/dL (ref 1.7–2.4)

## 2019-06-28 MED ORDER — SODIUM CHLORIDE 0.9 % IV SOLN: INTRAVENOUS | Status: AC

## 2019-06-28 NOTE — TOC Transition Note (Signed)
Transition of Care [facility]) - CM/SW Discharge Note   Patient Details  Name: RIGGINS CISEK MRN: [id number] Date of Birth: April 17, 1961  Transition of Care [facility]) CM/SW Contact:  Carles Collet, RN Phone Number: 06/28/2019, 3:06 PM   Clinical Narrative:    Damaris Schooner w wife on the phone. We discussed disposition. Her goal is to have the patient come home, but she has not ruled out the possibility of him going to SNF. I spoke to the patient earlier and he is preferring home but open to SNF. PT notes are encouraging that he is making progress, but are still recommending SNF.  We planned for her to come to the hospital tomorrow and spend some hours here. I gave her the goal to assist him with getting out of bed, going to the bathroom, changing his robe, and eating to help her determine if the level of support he needs is that of which her and the family can provide at home.       Barriers to Discharge: Continued Medical Work up   Patient Goals and CMS Choice Patient states their goals for this hospitalization and ongoing recovery are:: Get stronger to go home CMS Medicare.gov Compare Post Acute Care list provided to:: Patient Represenative (must comment)(Spouse) Choice offered to / list presented to : Patient, Spouse  Discharge Placement                       Discharge Plan and Services In-house Referral: Clinical Social Work Discharge Planning Services: CM Consult Post Acute Care Choice: Home Health                               Social Determinants of Health (SDOH) Interventions     Readmission Risk Interventions No flowsheet data found.

## 2019-06-28 NOTE — Progress Notes (Signed)
Nephrology Progress Note:   Patient ID: Scott Mcgee, male   DOB: 09/06/1960, 59 y.o.   MRN: [id number]   S:  3 voids and 250 mL noted for 2/23.  Strict ins/outs not available.  Bladder scan with 0 mL retained.  Feels well.  States abdomen has always been distended/current habitus is normal.   Review of systems:   Denies shortness of breath  Denies n/v  No cp  O:BP (!) 97/57 (BP Location: Right Arm)   Pulse 64   Temp 98.5 F (36.9 C) (Oral)   Resp (!) 30   Ht _0  (1.676 m)   Wt 74 kg   SpO2 96%   BMI 26.33 kg/m  No intake or output data in the 24 hours ending 06/28/19 1512 Intake/Output: I/O last 3 completed shifts: In: -  Out: 850 [Urine:850]  Intake/Output this shift:  No intake/output data recorded. Weight change: 0.8 kg   Physical exam Gen: adult male seated NAD CVS:  S1s2 no rub Resp: clear and unlabored at rest on room air - oxygen is off Abd: distended, soft, NT Ext: no edema appreciated; no clubbing or cyanosis  Neuro awake and able to converse; oriented to person, year, and location  Psych - limited insight; no agitation on exam   Recent Labs  Lab 06/22/19 1046 06/22/19 1046 06/23/19 0430 06/24/19 0416 06/24/19 1142 06/25/19 0305 06/26/19 0850 06/27/19 0207 06/27/19 0732 06/28/19 0304  NA 150*   < > 153* 152* 152* 150* 149* 141  --  142  K 4.6   < > 4.8 5.8* 4.1 3.7 3.6 4.0  --  3.7  CL 112*   < > 113* 112* 114* 117* 113* 109  --  110  CO2 24   < > _1 20*  --  22  GLUCOSE 104*   < > 136* 100* 114* 118* 159* 140*  --  136*  BUN 53*   < > 56* 41* 41* 35* 23 19  --  13  CREATININE 2.02*   < > 2.35* 2.05* 2.00* 1.96* 1.88* 1.99*  --  1.88*  ALBUMIN 2.5*   < > 2.2* 2.2*  --  2.1* 2.2* 2.3* 2.3* 2.2*  CALCIUM 9.7   < > 9.5 9.3 9.9 9.4 9.1 9.1  --  9.1  PHOS  --   --   --   --  4.7* 4.3 3.7 3.9  --  4.3  AST 89*  --  79* 99*  --  87* 63*  --  71* 57*  ALT 99*  --  78* 70*  --  68* 57*  --  55* 45*   < > = values in this interval not  displayed.   Liver Function Tests: Recent Labs  Lab 06/26/19 0850 06/26/19 0850 06/27/19 0207 06/27/19 0732 06/28/19 0304  AST 63*  --   --  71* 57*  ALT 57*  --   --  55* 45*  ALKPHOS 768*  --   --  713* 659*  BILITOT 2.7*  --   --  2.3* 1.9*  PROT 6.8  --   --  6.9 6.7  ALBUMIN 2.2*   < > 2.3* 2.3* 2.2*   < > = values in this interval not displayed.   CBC: Recent Labs  Lab 06/24/19 0438 06/24/19 0438 06/25/19 0305 06/25/19 0305 06/26/19 0850 06/27/19 0207 06/28/19 0304  WBC 10.5   < > 7.8   < > 8.6 9.4 8.8  NEUTROABS 7.6  --  5.1  --  5.7  --   --   HGB 8.6*   < > 8.1*   < > 8.3* 8.3* 8.5*  HCT 27.6*   < > 25.5*   < > 27.0* 26.8* 26.8*  MCV 93.9  --  92.7  --  94.7 93.4 91.5  PLT 424*   < > 417*   < > 458* 453* 418*   < > = values in this interval not displayed.   Cardiac Enzymes: No results for input(s): CKTOTAL, CKMB, CKMBINDEX, TROPONINI in the last 168 hours. CBG: Recent Labs  Lab 06/27/19 1928 06/27/19 2320 06/28/19 0314 06/28/19 0731 06/28/19 1142  GLUCAP 118* 129* 124* 121* 150*   Studies/Results: No results found. . chlorhexidine gluconate (MEDLINE KIT)  15 mL Mouth Rinse BID  . Chlorhexidine Gluconate Cloth  6 each Topical Daily  . clonazepam  0.25 mg Oral BID  . feeding supplement (ENSURE ENLIVE)  237 mL Oral BID BM  . feeding supplement (PRO-STAT SUGAR FREE 64)  30 mL Oral Daily  . folic acid  1 mg Oral Daily  . Gerhardt's butt cream   Topical BID  . heparin injection (subcutaneous)  5,000 Units Subcutaneous Q8H  . insulin aspart  0-15 Units Subcutaneous TID AC & HS  . mouth rinse  15 mL Mouth Rinse BID  . multivitamin with minerals  1 tablet Oral Daily  . nystatin  5 mL Oral QID  . polyethylene glycol  17 g Oral Daily  . senna-docusate  1 tablet Oral BID  . thiamine  100 mg Oral Daily   Or  . thiamine  100 mg Intravenous Daily    BMET    Component Value Date/Time   NA 142 06/28/2019 0304   K 3.7 06/28/2019 0304   CL 110  06/28/2019 0304   CO2 22 06/28/2019 0304   GLUCOSE 136 (H) 06/28/2019 0304   BUN 13 06/28/2019 0304   CREATININE 1.88 (H) 06/28/2019 0304   CALCIUM 9.1 06/28/2019 0304   GFRNONAA 38 (L) 06/28/2019 0304   GFRAA 44 (L) 06/28/2019 0304   CBC    Component Value Date/Time   WBC 8.8 06/28/2019 0304   RBC 2.93 (L) 06/28/2019 0304   HGB 8.5 (L) 06/28/2019 0304   HCT 26.8 (L) 06/28/2019 0304   PLT 418 (H) 06/28/2019 0304   MCV 91.5 06/28/2019 0304   MCH 29.0 06/28/2019 0304   MCHC 31.7 06/28/2019 0304   RDW 15.0 06/28/2019 0304   LYMPHSABS 2.0 06/26/2019 0850   MONOABS 0.5 06/26/2019 0850   EOSABS 0.4 06/26/2019 0850   BASOSABS 0.1 06/26/2019 0850    Assessment/Plan: 1. AKI/CKD stage 3- in setting of recent urosepsis now with decreased UOP, hypotension and worsening renal function. No nephrotoxic agents noted. Pt not able to drink due to AMS so intravascular volume depletion may play a role, however he has also been on abx so AIN is also on DDx as well as ischemic ATN due to relative hypotension. UA 0-5 RBC and 2m/dL protein.  Given AMS and recurrent AKI acute GN/vasculitis workup was initiated. HIV nonreactive.  Complement acceptable. AntidsDNA neg. ANCA negative. ANA neg. Anti-GBM negative. No urine eos. SPEP without M spike. Free light chains ratio normal.   - normal saline at 75/hr x 12 hours  -remaining pending work-up includes: ASO  2. Hyperkalemia- s/p lokelma; resolved 3. Acute metabolic encephalopathy- possible alcohol withdrawal but has been treated with benzos.  Work-up per primary team;  delirium as well  4. Abnormal LFT's- elevated GGT and alk phos.  GI has signed off.     5. Abdominal distension- no ascites, may be due to intraperitoneal fat. 6. Hypernatremia- resolved; free water deficit s/p d5w   7. Diastolic CHF- stable volume  8. Anemia of critical illness- cont to follow; no acute indication for PRBC's. Reports hx prostate cancer a few years ago - no current  malignancy.   AKI improving with supportive care and work-up for GN negative thus far - ASO still pending.  Will sign off.  Will follow ASO.  Can follow-up with [facility] in 2-3 weeks after discharge.     Claudia Desanctis, MD 06/28/2019 3:37 PM

## 2019-06-28 NOTE — Progress Notes (Signed)
PROGRESS NOTE    Scott Mcgee    Code Status: Full Code  RXV:[id number] DOB: December 19, 1960 DOA: 06/12/2019 LOS: 16 days  PCP: [facility]:  Chief Complaint  Patient presents with  . [facility] Summary  This is a 59 year old male with past medical history of diabetes, hypertension, prostate cancer who was admitted on 2/8 with dysuria, low-grade fevers for several days as well as dysuria x2 weeks.  To have hyperglycemia, hyponatremia and elevated creatinine ED UA positive for UTI.  CT abdomen/pelvis with stranding around kidneys without other abnormalities.  Started on insulin drip and IV fluids.  PCCM consulted on 2/9 for acute hypoxemic respiratory failure and in respiratory distress and patient was subsequently intubated.  Meropenem changed to ceftriaxone on 2/10.  Change in mental status on 2/13 noted by PCCM with bradycardia and EEG was ordered and patient was further sedated.  EEG showed profound diffuse encephalopathy without specific etiology and no seizures or epileptiform discharges.  Patient was successfully extubated on 2/17.  Patient transferred to progressive floor and TRH resumed care 2/18.  2/18: Noted to have CIWA 17 early a.m. with frequent Ativan administration per CIWA protocol. Diaphoresis, Rigors noted with abdominal distention.  Ammonia level minimally elevated.  Started on lactulose.  Also started on D5W for hypernatremia and free water deficit.  Foley catheter placed due to urinary retention.  Repeat blood cultures ordered and US guided paracentesis ordered.  2/19: Nephrology consulted for worsening renal failure 2/20: Gi consulted for persistent elevated Alk Phos and GGT. MRI brain. Void trial 2/21: Cefepime held. CIWA discontinued. Clonazepam started for taper  A & P   Active Problems:   Type 2 diabetes mellitus with hyperosmolar hyperglycemic state (HHS) (HCC)   Hyponatremia   HTN (hypertension)   Acute renal failure  (HCC)   Abdominal distention   Alcohol use   Sepsis ([place])   Acute pyelonephritis   Acute respiratory failure ([place])   Severe sepsis with recent multidrug resistant E. coli UTI and suspected post extubation aspiration pneumonia Sepsis resolved a. 2 days meropenem followed by 7 days ceftriaxone in ICU (completed 2/16).  b. Started on Cefepime 2/19 due to sepsis unknown source, possibly aspiration (CXR 2/17 with slight worsening patchy opacity in right lung base), Procalcitonin 1.9 c. Cefepime stopped as below d. blood cultures, no growth to date  Acute encephalopathy, likely multifactorial: Sepsis, AKI, hepatic encephalopathy, hypoxemia, hypoglycemia, most notably hypernatremia and medication induced (ativan, Cefepime) e. Continues to improve, he is awake alert x3 today f. CT brain negative for acute abnormality g. EEG unremarkable for seizure h. MRI brain unremarkable i. Continue benzo taper with Clonazepam 0.5->0.25 mg bid and taper off  j. Discontinued seroquel k. Discontinued Cefepime Hypernatremia due to poor p.o. 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 Creatinine peaked at 3.9, nephrology consult greatly appreciated  m. Vasculitic workup per nephro unremarkable so far n. Myeloma workup is unremarkable o. Improving with fluids Hypoglycemic episode p. Resolved with Dextrose Hyperkalemia q. Resolved with Lokelma Alcohol abuse r. Last drink was 10+ days ago (prior to admission), initial concern for alcohol withdrawal and patient received high amounts of Ativan due to this however CIWA protocol was discontinued as he was not thought to be withdrawing 2 weeks since last drink.  May have been withdrawing from Ativan from hospital stay.  Doing well on benzo  taper as above Abdominal distention s. This is patient's normal body habitus (confirmed by radiology from CT scan and [place]) t. Continue bowel regimen u. Ultrasound-guided paracentesis  ordered, no ascites noted Elevated LFTs v. Elevated ammonia improved with lactulose -discontinued for now w. Alk phos still elevated but improved today, GGT 653, elevated T bili improving x. RUQ ultrasound with hepatic steatosis, suspected cirrhosis and without cholelithiasis y. GI consulted, consider MRCP when stable  z. Significantly improved with discontinuation of medications Acute hypoxemic respiratory failure, Initially volume overloaded leading to intubation, then suspected aspiration pneumonia post extubation aa. Intubated 2/9-2/17, transferred to stepdown/TRH 2/18 bb. Volume improved cc. Completed 3 days cefepime for pneumonia, afebrile and with resolved leukocytosis dd. Discontinued Cefepime as above, hold off on further antibiotics for now Mild urinary retention, unknown etiology ee. Foley catheter placed 2/18, discontinued 2/20 ff. Bladder scan and straight cath protocol HFpEF, compensated gg. Stable volume today hh. I/os and Daily weights Normocytic anemia, suspect AoCD ii. Differential unremarkable jj. LDH and Haptoglobin normal Thrush  kk. Nystatin Diabetes ll. Continue current regimen   DVT prophylaxis: Subcu heparin Family Communication: None at bedside Disposition Plan:   Patient came from:  Home                                                                                          Barriers to d/c: Medical stability  To discharge home with home health  Pressure injury documentation    None  Consultants  PCCM GI Nephro  Procedures  ETT 2/9-2/17 CT abd  2/8: Cholelithiasis without complicating factors. Mild perinephric stranding is noted on the left without obstructive change. Underlying UTI could not be totally excluded. Mild bibasilar atelectasis. 2/9 echo: LVEF 60-65%, LVH 2/13 EEG:   Antibiotics   Anti-infectives (From admission, onward)   Start     Dose/Rate Route Frequency Ordered Stop   06/23/19 1900  ceFEPIme (MAXIPIME) 2 g in  sodium chloride 0.9 % 100 mL IVPB  Status:  Discontinued     2 g 200 mL/hr over 30 Minutes Intravenous Every 12 hours 06/23/19 1815 06/25/19 1314   06/14/19 1830  cefTRIAXone (ROCEPHIN) 2 g in sodium chloride 0.9 % 100 mL IVPB     2 g 200 mL/hr over 30 Minutes Intravenous Every 24 hours 06/14/19 1020 06/20/19 1803   06/12/19 1830  meropenem (MERREM) 1 g in sodium chloride 0.9 % 100 mL IVPB  Status:  Discontinued     1 g 200 mL/hr over 30 Minutes Intravenous Every 12 hours 06/12/19 1818 06/14/19 1020   06/12/19 1745  cefTRIAXone (ROCEPHIN) 2 g in sodium chloride 0.9 % 100 mL IVPB  Status:  Discontinued     2 g 200 mL/hr over 30 Minutes Intravenous Every 24 hours 06/12/19 1743 06/12/19 1814        Subjective   More interactive today and resting comfortably.  He denies any complaints this morning, reports he is feeling much better .  Objective   Vitals:   06/27/19 2325 06/28/19 0300 06/28/19 0733 06/28/19 1144  BP: (!) 109/55  (!) 98/58 (!) 97/57  Mcgee: 68  62 64  Resp: Marland Kitchen)  22  (!) 27 (!) 30  Temp:   98.2 F (36.8 C) 98.5 F (36.9 C)  TempSrc:   Oral Oral  SpO2: 95%  94% 96%  Weight:  74 kg    Height:       No intake or output data in the 24 hours ending 06/28/19 1444 Filed Weights   06/24/19 0410 06/27/19 0301 06/28/19 0300  Weight: 75.2 kg 73.2 kg 74 kg    Examination:  Awake Alert, Oriented X 3, easily distracted, no new F.N deficits, Normal affect Symmetrical Chest wall movement, Good air movement bilaterally, CTAB RRR,No Gallops,Rubs or new Murmurs, No Parasternal Heave +ve B.Sounds, Abd Soft, No tenderness, No rebound - guarding or rigidity. No Cyanosis, Clubbing or edema, No new Rash or bruise     Data Reviewed: I have personally reviewed following labs and imaging studies  CBC: Recent Labs  Lab 06/23/19 0430 06/23/19 0430 06/23/19 0953 06/23/19 0953 06/24/19 0438 06/25/19 0305 06/26/19 0850 06/27/19 0207 06/28/19 0304  WBC 15.3*   < > 14.0*   < >  10.5 7.8 8.6 9.4 8.8  NEUTROABS 11.8*  --  10.4*  --  7.6 5.1 5.7  --   --   HGB 8.2*   < > 8.1*   < > 8.6* 8.1* 8.3* 8.3* 8.5*  HCT 26.3*   < > 26.3*   < > 27.6* 25.5* 27.0* 26.8* 26.8*  MCV 92.3   < > 94.3   < > 93.9 92.7 94.7 93.4 91.5  PLT 379   < > 403*   < > 424* 417* 458* 453* 418*   < > = values in this interval not displayed.   Basic Metabolic Panel: Recent Labs  Lab 06/23/19 0430 06/24/19 0416 06/24/19 1142 06/25/19 0305 06/26/19 0850 06/27/19 0207 06/28/19 0304  NA 153*   < > 152* 150* 149* 141 142  K 4.8   < > 4.1 3.7 3.6 4.0 3.7  CL 113*   < > 114* 117* 113* 109 110  CO2 27   < > 24 25 24  20* 22  GLUCOSE 136*   < > 114* 118* 159* 140* 136*  BUN 56*   < > 41* 35* 23 19 13   CREATININE 2.35*   < > 2.00* 1.96* 1.88* 1.99* 1.88*  CALCIUM 9.5   < > 9.9 9.4 9.1 9.1 9.1  MG 2.4  --  2.4  --  2.3 2.1 2.1  PHOS  --   --  4.7* 4.3 3.7 3.9 4.3   < > = values in this interval not displayed.   GFR: Estimated Creatinine Clearance: 37.2 mL/min (A) (by C-G formula based on SCr of 1.88 mg/dL (H)). Liver Function Tests: Recent Labs  Lab 06/24/19 0416 06/24/19 0416 06/25/19 0305 06/26/19 0850 06/27/19 0207 06/27/19 0732 06/28/19 0304  AST 99*  --  87* 63*  --  71* 57*  ALT 70*  --  68* 57*  --  55* 45*  ALKPHOS 729*  --  800* 768*  --  713* 659*  BILITOT 4.9*  --  3.9* 2.7*  --  2.3* 1.9*  PROT 6.7  --  6.6 6.8  --  6.9 6.7  ALBUMIN 2.2*   < > 2.1* 2.2* 2.3* 2.3* 2.2*   < > = values in this interval not displayed.   No results for input(s): LIPASE, AMYLASE in the last 168 hours. Recent Labs  Lab 06/22/19 1046 06/23/19 0430 06/26/19 0850  AMMONIA 53* 40* 39*  Coagulation Profile: No results for input(s): INR, PROTIME in the last 168 hours. Cardiac Enzymes: No results for input(s): CKTOTAL, CKMB, CKMBINDEX, TROPONINI in the last 168 hours. BNP (last 3 results) No results for input(s): PROBNP in the last 8760 hours. HbA1C: No results for input(s): HGBA1C in the  last 72 hours. CBG: Recent Labs  Lab 06/27/19 1928 06/27/19 2320 06/28/19 0314 06/28/19 0731 06/28/19 1142  GLUCAP 118* 129* 124* 121* 150*   Lipid Profile: No results for input(s): CHOL, HDL, LDLCALC, TRIG, CHOLHDL, LDLDIRECT in the last 72 hours. Thyroid Function Tests: No results for input(s): TSH, T4TOTAL, FREET4, T3FREE, THYROIDAB in the last 72 hours. Anemia Panel: No results for input(s): VITAMINB12, FOLATE, FERRITIN, TIBC, IRON, RETICCTPCT in the last 72 hours. Sepsis Labs: Recent Labs  Lab 06/22/19 1614 06/24/19 0437 06/24/19 1142  PROCALCITON 1.90  --   --   LATICACIDVEN  --  1.0 1.2    Recent Results (from the past 240 hour(s))  Culture, respiratory (non-expectorated)     Status: None   Collection Time: 06/21/19  9:37 AM   Specimen: Tracheal Aspirate; Respiratory  Result Value Ref Range Status   Specimen Description TRACHEAL ASPIRATE  Final   Special Requests Normal  Final   Gram Stain   Final    ABUNDANT WBC PRESENT, PREDOMINANTLY PMN NO ORGANISMS SEEN Performed at [facility], [address] [place], [place] [address]    Culture RARE CANDIDA ALBICANS  Final   Report Status 06/24/2019 FINAL  Final  Culture, blood (routine x 2)     Status: None   Collection Time: 06/22/19 10:42 AM   Specimen: BLOOD  Result Value Ref Range Status   Specimen Description BLOOD LEFT ANTECUBITAL  Final   Special Requests   Final    BOTTLES DRAWN AEROBIC AND ANAEROBIC Blood Culture results may not be optimal due to an excessive volume of blood received in culture bottles   Culture   Final    NO GROWTH 5 DAYS Performed at [facility], [place] [address] [place], [place] [address]    Report Status 06/27/2019 FINAL  Final  Culture, blood (routine x 2)     Status: None   Collection Time: 06/22/19 10:47 AM   Specimen: BLOOD RIGHT HAND  Result Value Ref Range Status   Specimen Description BLOOD RIGHT HAND  Final   Special Requests   Final    BOTTLES DRAWN AEROBIC  AND ANAEROBIC Blood Culture results may not be optimal due to an excessive volume of blood received in culture bottles   Culture   Final    NO GROWTH 5 DAYS Performed at [facility], [place] [address] [place], [place] [address]    Report Status 06/27/2019 FINAL  Final         Radiology Studies: No results found.      Scheduled Meds: . chlorhexidine gluconate (MEDLINE KIT)  15 mL Mouth Rinse BID  . Chlorhexidine Gluconate Cloth  6 each Topical Daily  . clonazepam  0.25 mg Oral BID  . feeding supplement (ENSURE ENLIVE)  237 mL Oral BID BM  . feeding supplement (PRO-STAT SUGAR FREE 64)  30 mL Oral Daily  . folic acid  1 mg Oral Daily  . Gerhardt's butt cream   Topical BID  . heparin injection (subcutaneous)  5,000 Units Subcutaneous Q8H  . insulin aspart  0-15 Units Subcutaneous TID AC & HS  . mouth rinse  15 mL Mouth Rinse BID  .  multivitamin with minerals  1 tablet Oral Daily  . nystatin  5 mL Oral QID  . polyethylene glycol  17 g Oral Daily  . senna-docusate  1 tablet Oral BID  . thiamine  100 mg Oral Daily   Or  . thiamine  100 mg Intravenous Daily   Continuous Infusions: . sodium chloride 10 mL/hr at 06/21/19 [place] Breck Hollinger, MD [facility]   Call night coverage person covering after 7pm

## 2019-06-29 DIAGNOSIS — E871 Hypo-osmolality and hyponatremia: Secondary | ICD-10-CM

## 2019-06-29 LAB — CBC
HCT: 26.6 % — ABNORMAL LOW (ref 39.0–52.0)
Hemoglobin: 8.4 g/dL — ABNORMAL LOW (ref 13.0–17.0)
MCH: 28.9 pg (ref 26.0–34.0)
MCHC: 31.6 g/dL (ref 30.0–36.0)
MCV: 91.4 fL (ref 80.0–100.0)
Platelets: 404 10*3/uL — ABNORMAL HIGH (ref 150–400)
RBC: 2.91 MIL/uL — ABNORMAL LOW (ref 4.22–5.81)
RDW: 15.1 % (ref 11.5–15.5)
WBC: 8.2 10*3/uL (ref 4.0–10.5)
nRBC: 0 % (ref 0.0–0.2)

## 2019-06-29 LAB — RENAL FUNCTION PANEL
Albumin: 2.2 g/dL — ABNORMAL LOW (ref 3.5–5.0)
Anion gap: 10 (ref 5–15)
BUN: 15 mg/dL (ref 8–23)
CO2: 20 mmol/L — ABNORMAL LOW (ref 22–32)
Calcium: 9 mg/dL (ref 8.9–10.3)
Chloride: 108 mmol/L (ref 98–111)
Creatinine, Ser: 1.74 mg/dL — ABNORMAL HIGH (ref 0.61–1.24)
GFR calc Af Amer: 48 mL/min — ABNORMAL LOW (ref >60)
GFR calc non Af Amer: 41 mL/min — ABNORMAL LOW (ref >60)
Glucose, Bld: 106 mg/dL — ABNORMAL HIGH (ref 70–99)
Phosphorus: 4.4 mg/dL (ref 2.5–4.6)
Potassium: 3.9 mmol/L (ref 3.5–5.1)
Sodium: 138 mmol/L (ref 135–145)

## 2019-06-29 LAB — GLUCOSE, CAPILLARY
Glucose-Capillary: 106 mg/dL — ABNORMAL HIGH (ref 70–99)
Glucose-Capillary: 113 mg/dL — ABNORMAL HIGH (ref 70–99)
Glucose-Capillary: 122 mg/dL — ABNORMAL HIGH (ref 70–99)
Glucose-Capillary: 147 mg/dL — ABNORMAL HIGH (ref 70–99)

## 2019-06-29 LAB — ANTISTREPTOLYSIN O TITER: ASO: 48 IU/mL (ref 0.0–200.0)

## 2019-06-29 MED ORDER — ENSURE ENLIVE PO LIQD: 237.0000 mL | Freq: Two times a day (BID) | ORAL | 12 refills | Status: AC

## 2019-06-29 MED ORDER — THIAMINE HCL 100 MG PO TABS: 100.0000 mg | ORAL_TABLET | Freq: Every day | ORAL | 0 refills | Status: AC

## 2019-06-29 MED ORDER — ACETAMINOPHEN 325 MG PO TABS: 650.0000 mg | ORAL_TABLET | Freq: Four times a day (QID) | ORAL | Status: AC | PRN

## 2019-06-29 MED ORDER — FOLIC ACID 1 MG PO TABS: 1.0000 mg | ORAL_TABLET | Freq: Every day | ORAL | 0 refills | Status: AC

## 2019-06-29 NOTE — Progress Notes (Signed)
Patient was stable at discharge. I removed his IV. We reviewed the discharge education. Patient and verbalized understanding and had no further questions. Patient left with belongings and DME.

## 2019-06-29 NOTE — Progress Notes (Signed)
RN attempted to call pt's wife about boots and clothes that were left. Was sent to VM. Informed that wife had not set up VM. RN attempted to get in contact with pt and wife about belongings. Belongings in pt bags with ID stickers on them at nurses station in case they call/ return to reclaim them.

## 2019-06-29 NOTE — Progress Notes (Signed)
Physical Therapy Treatment Patient Details Name: Scott Mcgee MRN: [id number] DOB: November 24, 1960 Today's Date: 06/29/2019    History of Present Illness Pt is 59 yo male with PMH of DM, HTN, and prostate CA.  Admitted on 2/8 with dysuria and fever.  Found to be hyperglycemic, hyponatremic, and positive UTI with sepsis. On 2/9 pt with resp failure and required intubation.  EEG on 2/13 showed profound diffuse encephalopathy.  Pt was extubated on 2/17.   PT Comments    Pt progressing well with mobility. Able to transfer and ambulate with RW and min guard for balance; declined gait training with DME. Pt's cognitive status improving, continues to demonstrate decreased attention, impaired safety awareness and poor problem solving. Pt stating he is supposed to d/c home today. Reports he will have 24/7 assist available; recommend 24/7 assist as well as HHPT services.    Follow Up Recommendations  Home health PT;Supervision/Assistance - 24 hour     Equipment Recommendations  Rolling walker with 5" wheels;3in1 (PT)    Recommendations for Other Services       Precautions / Restrictions Precautions Precautions: Fall Restrictions Weight Bearing Restrictions: No    Mobility  Bed Mobility Overal bed mobility: Independent Bed Mobility: Supine to Sit;Sit to Supine              Transfers Overall transfer level: Needs assistance Equipment used: Rolling walker (2 wheeled) Transfers: Sit to/from Stand Sit to Stand: Supervision         General transfer comment: Able to perform multiple sit<>stand to RW, cues for hand placement initially; good carryover to subsequential trials  Ambulation/Gait Ambulation/Gait assistance: Min guard;Min assist Gait Distance (Feet): 600 Feet Assistive device: Rolling walker (2 wheeled) Gait Pattern/deviations: Step-through pattern;Decreased stride length Gait velocity: Decreased Gait velocity interpretation: <1.8 ft/sec, indicate of risk for recurrent  falls General Gait Details: Slow, mostly steady gait with RW and intermittent min guard for balance; 1x lateral LOB requiring minA to prevent fall. Pt required 1x seated rest break secondary to BLE fatigue. Declined gait training without DME   Stairs             Wheelchair Mobility    Modified Rankin (Stroke Patients Only)       Balance Overall balance assessment: Needs assistance   Sitting balance-Leahy Scale: Good Sitting balance - Comments: Able to don L sock, assist to don R sock sitting EOB   Standing balance support: During functional activity;Bilateral upper extremity supported;No upper extremity supported Standing balance-Leahy Scale: Fair Standing balance comment: Can static stand without UE support; improved stability with UE support                            Cognition Arousal/Alertness: Awake/alert Behavior During Therapy: WFL for tasks assessed/performed Overall Cognitive Status: Impaired/Different from baseline Area of Impairment: Attention;Memory;Following commands;Safety/judgement;Awareness;Problem solving                   Current Attention Level: Sustained;Selective Memory: Decreased short-term memory Following Commands: Follows one step commands consistently;Follows multi-step commands inconsistently Safety/Judgement: Decreased awareness of safety;Decreased awareness of deficits Awareness: Emergent Problem Solving: Requires verbal cues General Comments: Improved cognition; alert to place, self, and February 2021. Able to provide details regarding home set-up and PLOF. Following commands and interacting appropriately. Initially impulsive attempting to stand, but no other instances after asking pt to wait until therapist ready. Awareness of need to use RW since legs are weak and doesn't want to  fall      Exercises      General Comments        Pertinent Vitals/Pain Pain Assessment: No/denies pain    Home Living                       Prior Function            PT Goals (current goals can now be found in the care plan section) Acute Rehab PT Goals Patient Stated Goal: To go home today PT Goal Formulation: With patient Time For Goal Achievement: 07/07/19 Potential to Achieve Goals: Good Progress towards PT goals: Progressing toward goals    Frequency    Min 3X/week      PT Plan Discharge plan needs to be updated;Frequency needs to be updated;Equipment recommendations need to be updated    Co-evaluation              AM-PAC PT "6 Clicks" Mobility   Outcome Measure  Help needed turning from your back to your side while in a flat bed without using bedrails?: None Help needed moving from lying on your back to sitting on the side of a flat bed without using bedrails?: None Help needed moving to and from a bed to a chair (including a wheelchair)?: A Little Help needed standing up from a chair using your arms (e.g., wheelchair or bedside chair)?: A Little Help needed to walk in hospital room?: A Little Help needed climbing 3-5 steps with a railing? : A Little 6 Click Score: 20    End of Session Equipment Utilized During Treatment: Gait belt Activity Tolerance: Patient tolerated treatment well Patient left: in bed;with call bell/phone within reach;with bed alarm set Nurse Communication: Mobility status PT Visit Diagnosis: Unsteadiness on feet (R26.81);Muscle weakness (generalized) (M62.81)     Time: 3570-1779 PT Time Calculation (min) (ACUTE ONLY): 25 min  Charges:  $Gait Training: 8-22 mins $Therapeutic Activity: 8-22 mins                    Mabeline Caras, PT, DPT Acute Rehabilitation Services  Pager [phone] Office [place] 06/29/2019, 11:33 AM

## 2019-06-29 NOTE — TOC Transition Note (Addendum)
Transition of Care [facility]) - CM/SW Discharge Note   Patient Details  Name: Scott Mcgee MRN: [id number] Date of Birth: [birth year]  [facility] CM/SW Contact:  Pollie Friar, RN Phone Number: 06/29/2019, 3:01 PM   Clinical Narrative:    Patient discharging home with his spouse and [facility] services through [place]. Cory with [facility] accepted the referral.  Pt to have walker and 3 in 1 delivered to the room per [employer].  Pt has transportation home.   Addendum: [facility] unable to provide the staffing for the [facility]. CM sent referral to [facility] and they accepted.    Final next level of care: [place] Barriers to Discharge: No Barriers Identified   Patient Goals and CMS Choice Patient states their goals for this hospitalization and ongoing recovery are:: Get stronger to go home CMS Medicare.gov Compare Post Acute Care list provided to:: Patient Represenative (must comment) Choice offered to / list presented to : Spouse  Discharge Placement                       Discharge Plan and Services In-house Referral: Clinical Social Work Discharge Planning Services: CM Consult Post Acute Care Choice: Home Health          DME Arranged: 3-N-1, Walker rolling DME Agency: [employer] Date DME Agency Contacted: 06/29/19 Time DME Agency Contacted: 1501 Representative spoke with at DME Agency: [facility] HH Arranged: PT, OT, Nurse's Aide [place] Agency: [name] Date [place]: 06/29/19 Time [place]: 1501 Representative spoke with at [place]: cory  Social Determinants of Health ([place]) Interventions     Readmission Risk Interventions No flowsheet data found.

## 2019-06-29 NOTE — Plan of Care (Signed)
Care plan goals met. Pt adequate for discharge.  

## 2019-06-29 NOTE — Discharge Summary (Signed)
KATIE MOCH, is a 59 y.o. male  DOB [birth year]  MRN [id number].  Admission date:  06/12/2019  Admitting Physician  Kathie Dike, MD  Discharge Date:  06/29/2019   Primary MD  [facility]  Recommendations for primary care physician for things to follow:  - please check CBC, CMP during next visit   Admission Diagnosis  Hyponatremia [E87.1] Acute renal failure, unspecified acute renal failure type [facility]) [N17.9] Diabetic ketoacidosis without coma associated with type 2 diabetes mellitus ([place]) [E11.10] Leukocytosis, unspecified type [D72.829] Type 2 diabetes mellitus with hyperosmolar hyperglycemic state (HHS) ([place]) [E11.00, E11.65]   Discharge Diagnosis  Hyponatremia [E87.1] Acute renal failure, unspecified acute renal failure type ([place]) [N17.9] Diabetic ketoacidosis without coma associated with type 2 diabetes mellitus ([place]) [E11.10] Leukocytosis, unspecified type [D72.829] Type 2 diabetes mellitus with hyperosmolar hyperglycemic state (HHS) ([place]) [E11.00, E11.65]    Active Problems:   Type 2 diabetes mellitus with hyperosmolar hyperglycemic state (HHS) ([place])   Hyponatremia   HTN (hypertension)   Acute renal failure (HCC)   Abdominal distention   Alcohol use   Sepsis ([place])   Acute pyelonephritis   Acute respiratory failure ([place])      Past Medical History:  Diagnosis Date  . Bipolar 1 disorder ([place])    NO CURRENT MEDS  . Diabetes mellitus without complication ([place])    type 2   . Elevated cholesterol   . Glaucoma    LEFT EYE  . Hypertension   . Prostate cancer [facility])     Past Surgical History:  Procedure Laterality Date  . LYMPHADENECTOMY Bilateral 06/16/2017   Procedure: LYMPHADENECTOMY;  Surgeon: Alexis Frock, MD;  Location: [facility] ORS;  Service: Urology;  Laterality: Bilateral;  . PROSTATE BIOPSY    . ROBOT ASSISTED LAPAROSCOPIC RADICAL PROSTATECTOMY  N/A 06/16/2017   Procedure: ROBOTIC ASSISTED LAPAROSCOPIC RADICAL PROSTATECTOMY;  Surgeon: Alexis Frock, MD;  Location: [facility];  Service: Urology;  Laterality: N/A;       History of present illness and  Hospital Course:     Kindly see H&P for history of present illness and admission details, please review complete Labs, Consult reports and Test reports for all details in brief  HPI  from the history and physical done on the day of admission 06/12/2019  HPI: MERVIN RAMIRES is a 59 y.o. male with medical history significant of diabetes, hypertension, prostate cancer status post prostate resection, presents to the emergency room with a 2-week history of elevated blood sugars.  He reports that his sugars have been over 400.  He has been having intermittent fevers for the past few days.  He describes dysuria for the past 2 weeks.  No abdominal pain or flank pain.  He has had shortness of breath this morning, but no cough.  He has been having hiccups intermittently for the past few weeks.  He has not had any vomiting or diarrhea.  No blurred vision.  He has had a distended abdomen, but wife reports that this is been  present for several years now.  ED Course: He was evaluated emergency room where lab work showed that he was hyperglycemic, hyponatremic, elevated creatinine.  Urinalysis indicated possible infection.  CT of the abdomen pelvis performed showed questionable stranding around kidneys but otherwise no acute abnormalities.  He was started on insulin infusion as well as IV fluids and referred for admission.  Hospital Course   This is a 59 year old male with past medical history of diabetes, hypertension, prostate cancer who was admitted on 2/8 with dysuria, low-grade fevers for several days as well as dysuria x2 weeks.  To have hyperglycemia, hyponatremia and elevated creatinine ED UA positive for UTI.  CT abdomen/pelvis with stranding around kidneys without other abnormalities.  Started on  insulin drip and IV fluids.  PCCM consulted on 2/9 for acute hypoxemic respiratory failure and in respiratory distress and patient was subsequently intubated.  Meropenem changed to ceftriaxone on 2/10.  Change in mental status on 2/13 noted by PCCM with bradycardia and EEG was ordered and patient was further sedated.  EEG showed profound diffuse encephalopathy without specific etiology and no seizures or epileptiform discharges.  Patient was successfully extubated on 2/17.  Patient transferred to progressive floor and TRH resumed care 2/18.  2/18: Noted to have CIWA 17 early a.m. with frequent Ativan administration per CIWA protocol. Diaphoresis, Rigors noted with abdominal distention.  Ammonia level minimally elevated.  Started on lactulose.  Also started on D5W for hypernatremia and free water deficit.  Foley catheter placed due to urinary retention.  Repeat blood cultures ordered and US guided paracentesis ordered.  2/19: Nephrology consulted for worsening renal failure 2/20: Gi consulted for persistent elevated Alk Phos and GGT. MRI brain. Void trial 2/21: Cefepime held. CIWA discontinued. Clonazepam started for taper    Severe sepsis with recent multidrug resistant E. coli UTI and suspected post extubation aspiration pneumonia Sepsis resolved a. 2 days meropenem followed by 7 days ceftriaxone in ICU (completed 2/16).  b. Started on Cefepime 2/19 due to sepsis unknown source, possibly aspiration (CXR 2/17 with slight worsening patchy opacity in right lung base), Procalcitonin 1.9 c. Cefepime stopped as below d. blood cultures, no growth to date  Acute encephalopathy, likely multifactorial: Sepsis, AKI, hepatic encephalopathy, hypoxemia, hypoglycemia, most notably hypernatremia and medication induced (ativan, Cefepime) e. Continues to improve, he is awake alert x3 today, back to  baseline per  wife at bedside f. CT brain negative for acute abnormality g. EEG unremarkable for  seizure h. MRI brain unremarkable i. Continue benzo taper with Clonazepam 0.5->0.25 mg bid and taper off  j. Discontinued seroquel k. Discontinued Cefepime Hypernatremia due to poor p.o. 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 Creatinine peaked at 3.9, nephrology consult greatly appreciated  m. Vasculitic workup per nephro unremarkable so far n. Myeloma workup is unremarkable o. Creatinine 1.7 on discharge Hypoglycemic episode p. Resolved with Dextrose Hyperkalemia q. Resolved with Lokelma Alcohol abuse r. Last drink was 10+ days ago (prior to admission), initial concern for alcohol withdrawal and patient received high amounts of Ativan due to this however CIWA protocol was discontinued as he was not thought to be withdrawing 2 weeks since last drink.  May have been withdrawing from Ativan from hospital stay.  Doing well on benzo taper as above, no further benzo on discharge Abdominal distention s. This is patient's normal body habitus (confirmed by radiology from CT scan and [place]) t. Continue bowel regimen u.  Ultrasound-guided paracentesis ordered, no ascites noted Elevated LFTs v. Elevated ammonia improved with lactulose -discontinued for now w. Alk phos still elevated but improved today, GGT 653, elevated T bili improving x. RUQ ultrasound with hepatic steatosis, suspected cirrhosis and without cholelithiasis y. GI consulted, consider MRCP when stable  z. Significantly improved with discontinuation of medications Acute hypoxemic respiratory failure, Initially volume overloaded leading to intubation, then suspected aspiration pneumonia post extubation aa. Intubated 2/9-2/17, transferred to stepdown/TRH 2/18 bb. Volume improved cc. Completed 3 days cefepime for pneumonia, afebrile and with resolved leukocytosis dd. Discontinued Cefepime as above, hold off on further antibiotics for now Mild urinary retention, unknown etiology ee. Foley catheter placed 2/18,  discontinued 2/20 ff. Bladder scan and straight cath protocol HFpEF, compensated gg. Stable volume today Normocytic anemia, suspect AoCD ii. Differential unremarkable jj. LDH and Haptoglobin normal Thrush  kk. Nystatin Diabetes ll. Resume on home dose metformin     Discharge Condition:  Stable Cussed with wife, wife wants to take patient home,    Follow UP  Follow-up Information    [facility] [facility] up in 1 week(s).   Contact information: [address] [place] [address] [phone]             Discharge Instructions  and  Discharge Medications     Discharge Instructions    Diet - low sodium heart healthy   Complete by: As directed    Increase activity slowly   Complete by: As directed      Allergies as of 06/29/2019      Reactions   Clotrimazole 3 [clotrimazole] Hives   cream      Medication List    STOP taking these medications   AMLODIPINE BESYLATE PO   hydrochlorothiazide 25 MG tablet Commonly known as: HYDRODIURIL   HYDROcodone-acetaminophen 5-325 MG tablet Commonly known as: Norco     TAKE these medications   acetaminophen 325 MG tablet Commonly known as: TYLENOL Take 2 tablets (650 mg total) by mouth every 6 (six) hours as needed for mild pain, fever or headache. What changed:   medication strength  how much to take  reasons to take this   atorvastatin 20 MG tablet Commonly known as: LIPITOR Take 20 mg by mouth daily.   feeding supplement (ENSURE ENLIVE) Liqd Take 237 mLs by mouth 2 (two) times daily between meals. Start taking on: June 29, 930   folic acid 1 MG tablet Commonly known as: FOLVITE Take 1 tablet (1 mg total) by mouth daily. Start taking on: June 30, 2019   metFORMIN 500 MG tablet Commonly known as: GLUCOPHAGE Take 500 mg by mouth daily with breakfast.   multivitamin-iron-minerals-folic acid chewable tablet Chew 1 tablet by mouth daily.   polyethylene glycol 17 g  packet Commonly known as: MIRALAX / GLYCOLAX Take 17 g by mouth daily.   senna-docusate 8.6-50 MG tablet Commonly known as: Senokot-S Take 1 tablet by mouth 2 (two) times daily. While taking pain meds to prevent constipation   thiamine 100 MG tablet Take 1 tablet (100 mg total) by mouth daily. Start taking on: June 30, 2019         Diet and Activity recommendation: See Discharge Instructions above   Consults obtained -  PCCM GI Nephro Major procedures and Radiology Reports - PLEASE review detailed and final reports for all details, in brief -      CT ABDOMEN PELVIS WO CONTRAST  Result Date: 06/12/2019 CLINICAL DATA:  Abdominal pain and  discomfort for 1 week EXAM: CT ABDOMEN AND PELVIS WITHOUT CONTRAST TECHNIQUE: Multidetector CT imaging of the abdomen and pelvis was performed following the standard protocol without IV contrast. COMPARISON:  None. FINDINGS: Lower chest: Mild atelectatic changes are noted in the bases bilaterally. No sizable effusion is seen. Hepatobiliary: Gallbladder is decompressed with multiple small gallstones within. The liver demonstrates mild decreased attenuation consistent with fatty infiltration. Pancreas: Unremarkable. No pancreatic ductal dilatation or surrounding inflammatory changes. Spleen: Normal in size without focal abnormality. Adrenals/Urinary Tract: Adrenal glands are within normal limits. Kidneys demonstrate no evidence of renal calculi or urinary tract obstructive change. Mild perinephric stranding is noted on the left. Underlying urinary tract infection could not be totally excluded. Correlation with lab values is recommended. The bladder is decompressed. Stomach/Bowel: The appendix is within normal limits. No obstructive or inflammatory changes of the bowel are seen. The small bowel and stomach are unremarkable. Vascular/Lymphatic: Aortic atherosclerosis. No enlarged abdominal or pelvic lymph nodes. Reproductive: Prostate has been surgically  removed. Other: No abdominal wall hernia or abnormality. No abdominopelvic ascites. Musculoskeletal: No acute bony abnormality is noted. A large exostosis is noted arising from the posterior aspect of the right iliac wing. IMPRESSION: Cholelithiasis without complicating factors. Mild perinephric stranding is noted on the left without obstructive change. Underlying UTI could not be totally excluded. Mild bibasilar atelectasis. Chronic appearing changes as described above. Electronically Signed   By: Inez Catalina M.D.   On: 06/12/2019 11:19   CT HEAD WO CONTRAST  Result Date: 06/17/2019 CLINICAL DATA:  Cerebral hemorrhage suspected. EXAM: CT HEAD WITHOUT CONTRAST TECHNIQUE: Contiguous axial images were obtained from the base of the skull through the vertex without intravenous contrast. COMPARISON:  Head CT 05/07/2016 FINDINGS: Brain: No evidence of acute intracranial hemorrhage. No demarcated cortical infarction. No evidence of intracranial mass. No midline shift or extra-axial fluid collection. Mild ill-defined hypoattenuation within the cerebral white matter is nonspecific, but consistent with chronic small vessel ischemic disease. Mild generalized parenchymal atrophy. Vascular: No hyperdense vessel.  Atherosclerotic calcifications. Skull: Normal. Negative for fracture or focal lesion. Sinuses/Orbits: Mild ethmoid and left maxillary sinus mucosal thickening. Small right maxillary sinus mucous retention cyst. No significant mastoid effusion. IMPRESSION: No CT evidence of acute intracranial abnormality. Mild generalized parenchymal atrophy and chronic small vessel ischemic disease. Electronically Signed   By: Kellie Simmering DO   On: 06/17/2019 14:13   MR BRAIN WO CONTRAST  Result Date: 06/24/2019 CLINICAL DATA:  Encephalopathy. EXAM: MRI HEAD WITHOUT CONTRAST TECHNIQUE: Multiplanar, multiecho pulse sequences of the brain and surrounding structures were obtained without intravenous contrast. COMPARISON:  Head CT  from 7 days ago FINDINGS: Brain: No acute infarction, hemorrhage, hydrocephalus, extra-axial collection or mass lesion. Greater than expected for age central atrophy with ventriculomegaly. Minimal small vessel ischemic type change. No chronic blood products. Vascular: Normal flow voids Skull and upper cervical spine: Normal marrow signal Sinuses/Orbits: Retention cysts in the right maxillary sinus. IMPRESSION: 1. No acute finding.  Motion degraded study without acute finding. 2. Generalized brain atrophy. Electronically Signed   By: Monte Fantasia M.D.   On: 06/24/2019 12:42   DG CHEST PORT 1 VIEW  Result Date: 06/23/2019 CLINICAL DATA:  58 year old male with cough and dyspnea. EXAM: PORTABLE CHEST 1 VIEW COMPARISON:  Chest radiograph dated 06/21/2019. FINDINGS: Interval removal of the endotracheal, and enteric tubes as well as removal of the right IJ central venous line. There is shallow inspiration with bibasilar atelectasis. Pneumonia is not excluded. Faint densities in the right  mid lung field appears similar to prior radiograph. Patchy area of density at the left lung base may represent atelectasis or infiltrate. A small left pleural effusion may be present. No pneumothorax. Stable cardiac silhouette. No acute osseous pathology. IMPRESSION: 1. Interval removal of the support apparatus. 2. Shallow inspiration with bibasilar densities similar to prior radiograph which may represent atelectasis or infiltrate. Electronically Signed   By: Anner Crete M.D.   On: 06/23/2019 19:39   DG CHEST PORT 1 VIEW  Result Date: 06/21/2019 CLINICAL DATA:  Respiratory failure. EXAM: PORTABLE CHEST 1 VIEW COMPARISON:  Radiograph yesterday. FINDINGS: Endotracheal tube tip 3.5 cm from the carina. Enteric tube in place with tip and side-port below the diaphragm. Right central line tip at the atrial caval junction. Unchanged heart size and mediastinal contours. Unchanged retrocardiac opacity with air bronchograms. Slight  worsening patchy right basilar opacity. Probable left pleural effusion. No pneumothorax. IMPRESSION: 1. Slight worsening patchy opacity at the right lung base. 2. Unchanged retrocardiac opacity with air bronchograms. Probable left pleural effusion. 3. Stable support apparatus. Electronically Signed   By: Keith Rake M.D.   On: 06/21/2019 06:17   DG Chest Port 1 View  Result Date: 06/20/2019 CLINICAL DATA:  Respiratory failure. EXAM: PORTABLE CHEST 1 VIEW COMPARISON:  06/19/2019. FINDINGS: Endotracheal tube, NG tube, right IJ line unchanged position. Stable cardiomegaly. Patchy bibasilar infiltrates again noted without interim change. Small left pleural effusion cannot be excluded. No pneumothorax. IMPRESSION: 1.  Lines and tubes in unchanged position. 2.  Stable cardiomegaly. 3. Patchy bibasilar pulmonary infiltrates again noted without interim change. Small left pleural effusion again noted. No pneumothorax. Electronically Signed   By: Marcello Moores  Register   On: 06/20/2019 08:32   DG Chest Port 1 View  Result Date: 06/19/2019 CLINICAL DATA:  Acute respiratory failure with hypoxia. EXAM: PORTABLE CHEST 1 VIEW COMPARISON:  Most recent radiograph 06/17/2019 FINDINGS: Endotracheal tube tip 3.8 cm from the carina. Tip and side port of the enteric tube below the diaphragm in the stomach. Right internal jugular central venous catheter tip in the right atrium. Patchy bibasilar airspace opacities, with slight improvement on the right but worsening at the left lung base. Unchanged heart size and mediastinal contours. No pleural fluid or pneumothorax. IMPRESSION: 1. Patchy bibasilar airspace opacities, improvement on the right but worsening on the left. 2. Support apparatus unchanged. Electronically Signed   By: Keith Rake M.D.   On: 06/19/2019 05:25   DG Chest Port 1 View  Result Date: 06/17/2019 CLINICAL DATA:  Acute respiratory failure EXAM: PORTABLE CHEST 1 VIEW COMPARISON:  06/16/19 FINDINGS:  Endotracheal tube, gastric catheter and right jugular central line are again seen and stable. Patchy airspace opacities are again identified throughout the right lung. Mild left retrocardiac opacity is noted as well stable from the prior exam. IMPRESSION: Stable appearance when compare with the prior exam. Electronically Signed   By: Inez Catalina M.D.   On: 06/17/2019 13:30   DG CHEST PORT 1 VIEW  Result Date: 06/16/2019 CLINICAL DATA:  59 year old with acute respiratory failure. EXAM: PORTABLE CHEST 1 VIEW COMPARISON:  06/14/2009 FINDINGS: Endotracheal tube is roughly 2.3 cm above the carina. Stable position of the right jugular central line with the tip in the right atrium. Nasogastric tube extends into the abdomen. Negative for pneumothorax. Persistent low lung volumes. Stable interstitial / airspace densities in the right lung with minimal change. Faint densities at the left lung base are unchanged. IMPRESSION: 1. Stable position of the support apparatuses. Endotracheal  tube is appropriately positioned. 2. No significant change in the right lung airspace disease. Electronically Signed   By: Markus Daft M.D.   On: 06/16/2019 09:31   DG Chest Port 1 View  Result Date: 06/15/2019 CLINICAL DATA:  Acute respiratory failure EXAM: PORTABLE CHEST 1 VIEW COMPARISON:  Yesterday FINDINGS: Intubation with tube tip halfway between the clavicular heads and carina. The enteric tube reaches the stomach. Right IJ line with tip at the right atrium. Increasingly dense bilateral airspace opacity. Lung volumes remain low. Generous heart size accentuated by low volumes. IMPRESSION: 1. Stable hardware positioning as described. 2. Pneumonia with increased density on the right. Electronically Signed   By: Monte Fantasia M.D.   On: 06/15/2019 04:12   DG Chest Port 1 View  Result Date: 06/14/2019 CLINICAL DATA:  Hypoxia EXAM: PORTABLE CHEST 1 VIEW COMPARISON:  June 13, 2019 FINDINGS: Endotracheal tube tip is 3.1 cm above  the carina. Central catheter tip is in the right atrium slightly beyond the cavoatrial junction. Nasogastric tube tip and side port are below the diaphragm. No pneumothorax. There is airspace opacity in the left lower lobe with small left effusion. Lungs elsewhere clear. Heart is borderline enlarged but stable. The pulmonary vascularity is normal. No adenopathy. No bone lesions. IMPRESSION: Tube and catheter positions as described without pneumothorax. Patchy airspace opacity concerning for pneumonia with atelectasis in the left lower lobe. Small left pleural effusion also present. Lungs elsewhere clear. Stable cardiac prominence. Electronically Signed   By: Lowella Grip III M.D.   On: 06/14/2019 08:43   DG Chest Portable 1 View  Result Date: 06/13/2019 CLINICAL DATA:  Increased shortness of breath, diabetes mellitus, hypertension EXAM: PORTABLE CHEST 1 VIEW COMPARISON:  Portable exam 0738 hours compared to 06/12/2019 FINDINGS: Normal heart size and pulmonary vascularity. Chronic opacification of the LEFT costophrenic angle by enlarged epicardial fat pad. Bibasilar atelectasis, cannot completely exclude developing infiltrate in LEFT lower lobe. Upper lungs clear. No definite pleural effusion or pneumothorax. No acute osseous findings. IMPRESSION: Bibasilar atelectasis, cannot exclude developing infiltrate in LEFT lower lobe. Electronically Signed   By: Lavonia Dana M.D.   On: 06/13/2019 08:06   DG Chest Portable 1 View  Result Date: 06/12/2019 CLINICAL DATA:  Diabetic ketoacidosis. EXAM: PORTABLE CHEST 1 VIEW COMPARISON:  CT scan of the abdomen dated 06/12/2019 FINDINGS: The heart size and pulmonary vascularity are normal. There is increased density at the left base laterally due to a prominent pericardial fat pad as demonstrated on the CT scan of the abdomen dated 06/12/2019. Slight atelectasis at the lung bases. No acute bone abnormality. IMPRESSION: Minimal bibasilar atelectasis. Electronically Signed    By: Lorriane Shire M.D.   On: 06/12/2019 18:42   DG Chest Port 1V same Day  Result Date: 06/13/2019 CLINICAL DATA:  Central line placement EXAM: PORTABLE CHEST 1 VIEW COMPARISON:  06/13/2019 FINDINGS: Right central line tip is in the upper right atrium. No pneumothorax. Endotracheal tube and NG tube are unchanged. Bilateral airspace disease, low volumes and cardiomegaly. Airspace disease is worsening since prior study. IMPRESSION: Right central line tip in the upper right atrium.  No pneumothorax. Cardiomegaly with bilateral airspace disease, worsening since prior study. Electronically Signed   By: Rolm Baptise M.D.   On: 06/13/2019 15:23   DG Chest Port 1V same Day  Result Date: 06/13/2019 CLINICAL DATA:  Endotracheal and orogastric tube placement. EXAM: PORTABLE CHEST 1 VIEW COMPARISON:  June 13, 2019. FINDINGS: Stable cardiomegaly. Endotracheal and nasogastric tubes are in  grossly good position. No pneumothorax is noted. Mild bibasilar subsegmental atelectasis is noted. Bony thorax is unremarkable IMPRESSION: Endotracheal and nasogastric tubes in grossly good position. Mild bibasilar subsegmental atelectasis. Electronically Signed   By: Marijo Conception M.D.   On: 06/13/2019 13:57   DG Abd Portable 1V  Result Date: 06/21/2019 CLINICAL DATA:  Respiratory failure.  Abdominal distension. EXAM: PORTABLE ABDOMEN - 1 VIEW COMPARISON:  06/13/2019 FINDINGS: The enteric tube has been removed. Gas noted within nondistended stomach. Diffusely diminished bowel gas identified compared with previous exam. No dilated loops of bowel identified. Bony exostosis arising from the right iliac bone is again noted. IMPRESSION: Diminished bowel gas.  No findings of bowel obstruction however Electronically Signed   By: Kerby Moors M.D.   On: 06/21/2019 10:40   DG Abd Portable 1V  Result Date: 06/13/2019 CLINICAL DATA:  Abdominal distension. EXAM: PORTABLE ABDOMEN - 1 VIEW COMPARISON:  Abdominal CT yesterday. FINDINGS:  Tip and side port of the enteric tube below the diaphragm in the stomach. No bowel dilatation to suggest obstruction. Air within scattered normal caliber small large bowel. Decreased stool burden from CT yesterday. No evidence of free air on single supine view. Bony exostosis from the right iliac bone better characterized on prior CT. IMPRESSION: Tip and side port of the enteric tube below the diaphragm in the stomach. Normal bowel gas pattern without evidence of obstruction. Electronically Signed   By: Keith Rake M.D.   On: 06/13/2019 17:53   EEG adult  Result Date: 06/17/2019 Lora Havens, MD     06/17/2019  4:01 PM Patient Name: MIKA ANASTASI MRN: [id number] Epilepsy Attending: Lora Havens Referring Physician/Provider: Dr Baltazar Apo Date: 06/17/2019 Duration: 24.15 mins Patient history: 59 y.o.malewith medical history significant ofdiabetes, hypertension, prostate cancer status post prostate resection, presents to the emergency room with a 2-week history of elevated blood sugars. He reported that his sugars have been over 400. Today, had acute change in overall status.  He was awake, interacting with the nurse, mouthing words and then developed bradycardia, change in level of consciousness, began biting ET tube.  No overt tonic-clonic movement.  Associated with acute desaturation. EEG to evaluate for seizure. Level of alertness: comatose AEDs during EEG study: versed Technical aspects: This EEG study was done with scalp electrodes positioned according to the 10-20 International system of electrode placement. Electrical activity was acquired at a sampling rate of 500Hz  and reviewed with a high frequency filter of 70Hz  and a low frequency filter of 1Hz . EEG data were recorded continuously and digitally stored. DESCRIPTION: EEG showed continuous generalized background suppression. EEG was not reactive to noxious stimulation. Hyperventilation and photic stimulation were not performed.  ABNORMALITY - Background suppression, generalized IMPRESSION: This study is suggestive of profound diffuse encephalopathy, non specific to etiology. No seizures or epileptiform discharges were seen throughout the recording. Lora Havens   ECHOCARDIOGRAM COMPLETE  Result Date: 06/13/2019    ECHOCARDIOGRAM REPORT   Patient Name:   HARGIS VANDYNE Date of Exam: 06/13/2019 Medical Rec #:  [id number]       Height:       66.0 in Accession #:    [id number]      Weight:       166.9 lb Date of Birth:  Mar 08, 1961       BSA:          1.85 m Patient Age:    67 years        BP:  134/71 mmHg Patient Gender: M               HR:           101 bpm. Exam Location:  [facility] Procedure: 2D Echo, Cardiac Doppler and Color Doppler Indications:    R06.02 SOB Congenital heart disease.  History:        Patient has no prior history of Echocardiogram examinations.                 Signs/Symptoms:Bacteremia and Dyspnea; Risk Factors:Hypertension                 and Diabetes. Cancer.  Sonographer:    Roseanna Rainbow RDCS (AE) Referring Phys: [phone] [facility]  Sonographer Comments: Suboptimal apical window. Image acquisition challenging due to respiratory motion. IMPRESSIONS  1. Left ventricular ejection fraction, by estimation, is 60 to 65%. The left ventricle has normal function. The left ventrical has no regional wall motion abnormalities. There is moderately increased left ventricular hypertrophy. Left ventricular diastolic parameters are indeterminate.  2. Right ventricular systolic function is normal. The right ventricular size is normal.  3. No evidence of mitral valve regurgitation.  4. The aortic valve is tricuspid. Aortic valve regurgitation is not visualized. No aortic stenosis is present. FINDINGS  Left Ventricle: Left ventricular ejection fraction, by estimation, is 60 to 65%. The left ventricle has normal function. The left ventricle has no regional wall motion abnormalities. There is moderately increased left  ventricular hypertrophy. Left ventricular diastolic parameters are indeterminate. Right Ventricle: The right ventricular size is normal. No increase in right ventricular wall thickness. Right ventricular systolic function is normal. The tricuspid regurgitant velocity is 2.62 m/s, and with an assumed right atrial pressure of 8 mmHg, the estimated right ventricular systolic pressure is 50.2 mmHg. Left Atrium: Left atrial size was normal in size. Right Atrium: Right atrial size was normal in size. Pericardium: There is no evidence of pericardial effusion. Mitral Valve: The mitral valve is normal in structure and function. No evidence of mitral valve regurgitation. No evidence of mitral valve stenosis. Tricuspid Valve: The tricuspid valve is normal in structure. Tricuspid valve regurgitation is trivial. No evidence of tricuspid stenosis. Aortic Valve: The aortic valve is tricuspid. Aortic valve regurgitation is not visualized. No aortic stenosis is present. Aortic valve mean gradient measures 3.7 mmHg. Aortic valve peak gradient measures 6.4 mmHg. Aortic valve area, by VTI measures 2.94 cm. Pulmonic Valve: The pulmonic valve was not well visualized. Pulmonic valve regurgitation is not visualized. No evidence of pulmonic stenosis. Aorta: The aortic root is normal in size and structure. Pulmonary Artery: Indeterminate PASP, inadequate TR jet. Venous: The inferior vena cava was not well visualized. IAS/Shunts: No atrial level shunt detected by color flow Doppler.  LEFT VENTRICLE PLAX 2D LVIDd:         4.09 cm       Diastology LVIDs:         2.78 cm       LV e' lateral:   8.81 cm/s LV PW:         1.48 cm       LV E/e' lateral: 10.1 LV IVS:        1.35 cm       LV e' medial:    8.14 cm/s LVOT diam:     2.00 cm       LV E/e' medial:  10.9 LV SV:         65.97 ml LV SV  Index:   23.64 LVOT Area:     3.14 cm  LV Volumes (MOD) LV area d, A2C:    21.00 cm LV area d, A4C:    22.20 cm LV area s, A2C:    11.40 cm LV area s, A4C:     10.90 cm LV major d, A2C:   5.90 cm LV major d, A4C:   6.00 cm LV major s, A2C:   5.09 cm LV major s, A4C:   4.62 cm LV vol d, MOD A2C: 67.7 ml LV vol d, MOD A4C: 67.8 ml LV vol s, MOD A2C: 21.8 ml LV vol s, MOD A4C: 22.0 ml LV SV MOD A2C:     45.9 ml LV SV MOD A4C:     67.8 ml LV SV MOD BP:      44.8 ml RIGHT VENTRICLE RV S prime:     10.00 cm/s TAPSE (M-mode): 1.6 cm LEFT ATRIUM             Index       RIGHT ATRIUM           Index LA diam:        4.10 cm 2.21 cm/m  RA Area:     13.30 cm LA Vol (A2C):   58.8 ml 31.75 ml/m RA Volume:   31.20 ml  16.85 ml/m LA Vol (A4C):   47.6 ml 25.71 ml/m LA Biplane Vol: 54.5 ml 29.43 ml/m  AORTIC VALVE AV Area (Vmax):    2.62 cm AV Area (Vmean):   2.49 cm AV Area (VTI):     2.94 cm AV Vmax:           126.09 cm/s AV Vmean:          92.230 cm/s AV VTI:            0.224 m AV Peak Grad:      6.4 mmHg AV Mean Grad:      3.7 mmHg LVOT Vmax:         105.00 cm/s LVOT Vmean:        73.200 cm/s LVOT VTI:          0.210 m LVOT/AV VTI ratio: 0.94  AORTA Ao Root diam: 3.10 cm MITRAL VALVE                        TRICUSPID VALVE MV Area (PHT): 3.77 cm             TR Peak grad:   27.5 mmHg MV Decel Time: 201 msec             TR Vmax:        262.00 cm/s MV E velocity: 88.75 cm/s 103 cm/s MV A velocity: 60.60 cm/s 70.3 cm/s SHUNTS MV E/A ratio:  1.46       1.5       Systemic VTI:  0.21 m                                     Systemic Diam: 2.00 cm Carlyle Dolly MD Electronically signed by Carlyle Dolly MD Signature Date/Time: 06/13/2019/3:37:45 PM    Final    [place] ASCITES (ABDOMEN LIMITED)  Result Date: 06/23/2019 CLINICAL DATA:  59 year old with increased abdominal girth. Evaluate for abdominal ascites. EXAM: LIMITED ABDOMEN ULTRASOUND FOR ASCITES TECHNIQUE: Limited ultrasound survey for ascites was performed in all four abdominal  quadrants. COMPARISON:  06/22/2019 and CT 06/12/2019 FINDINGS: Markedly increased echogenicity in the visualized liver. No ascites identified.  IMPRESSION: 1. No ascites. 2. Increased echogenicity in the liver. Findings are suggestive for hepatic steatosis. Electronically Signed   By: Markus Daft M.D.   On: 06/23/2019 07:48   US Abdomen Limited RUQ  Result Date: 06/22/2019 CLINICAL DATA:  Elevated alkaline phosphatase. EXAM: ULTRASOUND ABDOMEN LIMITED RIGHT UPPER QUADRANT COMPARISON:  Noncontrast CT 06/12/2019 FINDINGS: Gallbladder: Gallstones on prior CT are not delineated currently. No gallbladder wall thickening or abnormal distention. No pericholecystic fluid. No sonographic Murphy sign noted by sonographer. Common bile duct: Diameter: 5 mm, normal. Liver: No focal lesion identified. Heterogeneously increased in parenchymal echogenicity. Suggestion of slight capsular nodularity. Portal vein is patent on color Doppler imaging with normal direction of blood flow towards the liver. Other: No right upper quadrant ascites. IMPRESSION: 1. Increased hepatic echogenicity most consistent with hepatic steatosis. Suggestion of slight capsular nodularity raises possibility of cirrhosis. 2. Calcified gallstones on prior CT are not well delineated on the current exam. Electronically Signed   By: Keith Rake M.D.   On: 06/22/2019 04:32    Micro Results     Recent Results (from the past 240 hour(s))  Culture, respiratory (non-expectorated)     Status: None   Collection Time: 06/21/19  9:37 AM   Specimen: Tracheal Aspirate; Respiratory  Result Value Ref Range Status   Specimen Description TRACHEAL ASPIRATE  Final   Special Requests Normal  Final   Gram Stain   Final    ABUNDANT WBC PRESENT, PREDOMINANTLY PMN NO ORGANISMS SEEN Performed at [facility], [address], [place], [place] [address]    Culture RARE CANDIDA ALBICANS  Final   Report Status 06/24/2019 FINAL  Final  Culture, blood (routine x 2)     Status: None   Collection Time: 06/22/19 10:42 AM   Specimen: BLOOD  Result Value Ref Range Status   Specimen Description BLOOD  LEFT ANTECUBITAL  Final   Special Requests   Final    BOTTLES DRAWN AEROBIC AND ANAEROBIC Blood Culture results may not be optimal due to an excessive volume of blood received in culture bottles   Culture   Final    NO GROWTH 5 DAYS Performed at [facility], [place] [address], [place], [place] [address]    Report Status 06/27/2019 FINAL  Final  Culture, blood (routine x 2)     Status: None   Collection Time: 06/22/19 10:47 AM   Specimen: BLOOD RIGHT HAND  Result Value Ref Range Status   Specimen Description BLOOD RIGHT HAND  Final   Special Requests   Final    BOTTLES DRAWN AEROBIC AND ANAEROBIC Blood Culture results may not be optimal due to an excessive volume of blood received in culture bottles   Culture   Final    NO GROWTH 5 DAYS Performed at [facility], [place] [address] [place], [place] [address]    Report Status 06/27/2019 FINAL  Final       Today   Subjective:   Bernie Ransford today has no headache,no chest abdominal pain,no new weakness tingling or numbness, feels much better wants to go home today.   Objective:   Blood pressure 133/73, pulse 69, temperature 98.1 F (36.7 C), temperature source Oral, resp. rate (!) 31, height 5' 6"  (1.676 m), weight 74 kg, SpO2 96 %.   Intake/Output Summary (Last 24 hours) at 06/29/2019 1434 Last data filed  at 06/29/2019 0700 Gross per 24 hour  Intake --  Output 1600 ml  Net -1600 ml    Exam Awake Alert, Oriented x 3, No new F.N deficits, Normal affect Supple Neck,No JVD, No cervical lymphadenopathy appriciated.  Symmetrical Chest wall movement, Good air movement bilaterally, CTAB RRR,No Gallops,Rubs or new Murmurs, No Parasternal Heave +ve B.Sounds, Abd Soft, Non tender, No organomegaly appriciated, No rebound -guarding or rigidity. No Cyanosis, Clubbing or edema, No new Rash or bruise  Data Review   CBC w Diff:  Lab Results  Component Value Date   WBC 8.2 06/29/2019   HGB 8.4 (L) 06/29/2019   HCT 26.6  (L) 06/29/2019   PLT 404 (H) 06/29/2019   LYMPHOPCT 24 06/26/2019   MONOPCT 5 06/26/2019   EOSPCT 4 06/26/2019   BASOPCT 1 06/26/2019    CMP:  Lab Results  Component Value Date   NA 138 06/29/2019   K 3.9 06/29/2019   CL 108 06/29/2019   CO2 20 (L) 06/29/2019   BUN 15 06/29/2019   CREATININE 1.74 (H) 06/29/2019   PROT 6.7 06/28/2019   ALBUMIN 2.2 (L) 06/29/2019   BILITOT 1.9 (H) 06/28/2019   ALKPHOS 659 (H) 06/28/2019   AST 57 (H) 06/28/2019   ALT 45 (H) 06/28/2019  .   Total Time in preparing paper work, data evaluation and todays exam - 18 minutes  Phillips Climes M.D on 06/29/2019 at 2:34 PM  [facility]   Office  [phone]

## 2019-06-29 NOTE — Discharge Instructions (Signed)
Follow with Primary MD [facility] in 7 days   Get CBC, CMP, 2 view Chest X ray checked  by Primary MD next visit.    Activity: As tolerated with Full fall precautions use walker/cane & assistance as needed   Disposition Home    Diet: Heart Healthy /carb modifed , with feeding assistance and aspiration precautions.  For Heart failure patients - Check your Weight same time everyday, if you gain over 2 pounds, or you develop in leg swelling, experience more shortness of breath or chest pain, call your Primary MD immediately. Follow Cardiac Low Salt Diet and 1.5 lit/day fluid restriction.   On your next visit with your primary care physician please Get Medicines reviewed and adjusted.   Please request your Prim.MD to go over all Hospital Tests and Procedure/Radiological results at the follow up, please get all Hospital records sent to your Prim MD by signing hospital release before you go home.   If you experience worsening of your admission symptoms, develop shortness of breath, life threatening emergency, suicidal or homicidal thoughts you must seek medical attention immediately by calling 911 or calling your MD immediately  if symptoms less severe.  You Must read complete instructions/literature along with all the possible adverse reactions/side effects for all the Medicines you take and that have been prescribed to you. Take any new Medicines after you have completely understood and accpet all the possible adverse reactions/side effects.   Do not drive, operating heavy machinery, perform activities at heights, swimming or participation in water activities or provide baby sitting services if your were admitted for syncope or siezures until you have seen by Primary MD or a Neurologist and advised to do so again.  Do not drive when taking Pain medications.    Do not take more than prescribed Pain, Sleep and Anxiety Medications  Special Instructions: If you  have smoked or chewed Tobacco  in the last 2 yrs please stop smoking, stop any regular Alcohol  and or any Recreational drug use.  Wear Seat belts while driving.   Please note  You were cared for by a hospitalist during your hospital stay. If you have any questions about your discharge medications or the care you received while you were in the hospital after you are discharged, you can call the unit and asked to speak with the hospitalist on call if the hospitalist that took care of you is not available. Once you are discharged, your primary care physician will handle any further medical issues. Please note that NO REFILLS for any discharge medications will be authorized once you are discharged, as it is imperative that you return to your primary care physician (or establish a relationship with a primary care physician if you do not have one) for your aftercare needs so that they can reassess your need for medications and monitor your lab values.

## 2019-09-12 ENCOUNTER — Encounter: Payer: Self-pay | Admitting: Urology

## 2019-09-12 ENCOUNTER — Other Ambulatory Visit: Payer: Self-pay

## 2019-09-12 ENCOUNTER — Ambulatory Visit (INDEPENDENT_AMBULATORY_CARE_PROVIDER_SITE_OTHER): Payer: Medicare Other | Admitting: Urology

## 2019-09-12 VITALS — BP 189/92 | HR 94 | Temp 96.4°F | Ht 66.0 in | Wt 167.0 lb

## 2019-09-12 DIAGNOSIS — N521 Erectile dysfunction due to diseases classified elsewhere: Secondary | ICD-10-CM

## 2019-09-12 DIAGNOSIS — C61 Malignant neoplasm of prostate: Secondary | ICD-10-CM

## 2019-09-12 LAB — POCT URINALYSIS DIPSTICK
Glucose, UA: POSITIVE — AB
Ketones, UA: NEGATIVE
Leukocytes, UA: NEGATIVE
Nitrite, UA: NEGATIVE
Protein, UA: POSITIVE — AB
Spec Grav, UA: 1.025 (ref 1.010–1.025)
Urobilinogen, UA: NEGATIVE E.U./dL — AB
pH, UA: 5 (ref 5.0–8.0)

## 2019-09-12 MED ORDER — SILDENAFIL CITRATE 100 MG PO TABS: 100.0000 mg | ORAL_TABLET | Freq: Every day | ORAL | 99 refills | Status: AC | PRN

## 2019-09-12 NOTE — Progress Notes (Signed)
See progress notes

## 2019-09-12 NOTE — Progress Notes (Signed)
H&P  Chief Complaint: Prostate cancer + ED  History of Present Illness:   5.11.2021: Here today for f/u -- he has had no follow-up since prostatectomy in 2019. His primary reason for presenting today are his persistent difficulties achieving erection. He states the last time he was able to have penetrative intercourse was around 2-3 years ago. He has not tried oral medications for his ED.   IPSS Questionnaire (AUA-7): Over the past month.   1)  How often have you had a sensation of not emptying your bladder completely after you finish urinating?  1 - Less than 1 time in 5  2)  How often have you had to urinate again less than two hours after you finished urinating? 2 - Less than half the time  3)  How often have you found you stopped and started again several times when you urinated?  2 - Less than half the time  4) How difficult have you found it to postpone urination?  0 - Not at all  5) How often have you had a weak urinary stream?  1 - Less than 1 time in 5  6) How often have you had to push or strain to begin urination?  1 - Less than 1 time in 5  7) How many times did you most typically get up to urinate from the time you went to bed until the time you got up in the morning?  3 - 3 times  Total score:  0-7 mildly symptomatic   8-19 moderately symptomatic   20-35 severely symptomatic   Total: 10 QoL: 2  (below copied from [facility] records):  2.25.2019:  1 - Moderate Risk Prostate Cancer - s/p robotic prostatecotmy + ICG + nodes 06/2017 for [id number] Gleason 3+4=7 adenocarcinoma with negative margins. Pre-op PSA 5.8.    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 He had complete bladder neck preservation.   3 - Erectile Dysfunction - s/p prostatectomy 06/2017.    Past Medical History:  Diagnosis Date  . Bipolar 1 disorder ([place])    NO CURRENT MEDS  . Diabetes mellitus without complication ([place])    type 2   . Elevated cholesterol   . Glaucoma    LEFT EYE  .  Hypertension   . Prostate cancer [facility])     Past Surgical History:  Procedure Laterality Date  . LYMPHADENECTOMY Bilateral 06/16/2017   Procedure: LYMPHADENECTOMY;  Surgeon: Alexis Frock, MD;  Location: [facility];  Service: Urology;  Laterality: Bilateral;  . PROSTATE BIOPSY    . ROBOT ASSISTED LAPAROSCOPIC RADICAL PROSTATECTOMY N/A 06/16/2017   Procedure: ROBOTIC ASSISTED LAPAROSCOPIC RADICAL PROSTATECTOMY;  Surgeon: Alexis Frock, MD;  Location: [facility];  Service: Urology;  Laterality: N/A;    Home Medications:  Allergies as of 09/12/2019      Reactions   Clotrimazole 3 [clotrimazole] Hives   cream      Medication List       Accurate as of Sep 12, 2019 11:26 AM. If you have any questions, ask your nurse or doctor.        acetaminophen 325 MG tablet Commonly known as: TYLENOL Take 2 tablets (650 mg total) by mouth every 6 (six) hours as needed for mild pain, fever or headache.   atorvastatin 20 MG tablet Commonly known as: LIPITOR Take 20 mg by mouth daily.   feeding supplement (ENSURE ENLIVE) Liqd Take 237 mLs by mouth 2 (two) times daily between meals.   folic acid 1 MG  tablet Commonly known as: FOLVITE Take 1 tablet (1 mg total) by mouth daily.   metFORMIN 500 MG tablet Commonly known as: GLUCOPHAGE Take 500 mg by mouth daily with breakfast.   multivitamin-iron-minerals-folic acid chewable tablet Chew 1 tablet by mouth daily.   polyethylene glycol 17 g packet Commonly known as: MIRALAX / GLYCOLAX Take 17 g by mouth daily.   senna-docusate 8.6-50 MG tablet Commonly known as: Senokot-S Take 1 tablet by mouth 2 (two) times daily. While taking pain meds to prevent constipation   thiamine 100 MG tablet Take 1 tablet (100 mg total) by mouth daily.       Allergies:  Allergies  Allergen Reactions  . Clotrimazole 3 [Clotrimazole] Hives    cream    Family History  Problem Relation Age of Onset  . Cancer Mother        GYN problems    Social History:   reports that he has never smoked. He has never used smokeless tobacco. He reports that he does not drink alcohol or use drugs.  ROS: Urological Symptom Review  Patient is experiencing the following symptoms: Get up at night to urinate Stream starts and stops  Review of Systems Gastrointestinal (upper)  : Negative for upper GI symptoms Gastrointestinal (lower) : Negative for lower GI symptoms Constitutional : Negative for symptoms Skin: Negative for skin symptoms Eyes: Negative for eye symptoms Ear/Nose/Throat : Negative for Ear/Nose/Throat symptoms Hematologic/Lymphatic: Negative for Hematologic/Lymphatic symptoms Cardiovascular : Negative for cardiovascular symptoms Respiratory : Negative for respiratory symptoms Endocrine: Negative for endocrine symptoms Musculoskeletal: Negative for musculoskeletal symptoms Neurological: Negative for neurological symptoms Psychologic: Negative for psychiatric symptoms   Physical Exam:  Vital signs in last 24 hours: BP (!) 189/92   Pulse 94   Temp (!) 96.4 F (35.8 C)   Ht 5\' 6"  (1.676 m)   Wt 167 lb (75.8 kg)   BMI 26.95 kg/m  Constitutional:  Alert and oriented, No acute distress Cardiovascular: Regular rate  Respiratory: Normal respiratory effort, No mask GI: Abdomen is soft, nontender, nondistended, no abdominal masses. No CVAT. Umbilical hernia.   Genitourinary: Normal male phallus, testes are descended bilaterally and non-tender and without masses, scrotum is normal in appearance without lesions or masses, perineum is normal on inspection.  Lymphatic: No lymphadenopathy Neurologic: Grossly intact, no focal deficits Psychiatric: Normal mood and affect  Laboratory Data:  No results for input(s): WBC, HGB, HCT, PLT in the last 72 hours.  No results for input(s): NA, K, CL, GLUCOSE, BUN, CALCIUM, CREATININE in the last 72 hours.  Invalid input(s): CO3   Results for orders placed or performed in visit on 09/12/19  (from the past 24 hour(s))  POCT urinalysis dipstick     Status: Abnormal   Collection Time: 09/12/19 11:12 AM  Result Value Ref Range   Color, UA lt yellow    Clarity, UA clear    Glucose, UA Positive (A) Negative   Bilirubin, UA small    Ketones, UA neg    Spec Grav, UA 1.025 1.010 - 1.025   Blood, UA small    pH, UA 5.0 5.0 - 8.0   Protein, UA Positive (A) Negative   Urobilinogen, UA negative (A) 0.2 or 1.0 E.U./dL   Nitrite, UA neg    Leukocytes, UA Negative Negative   Appearance clear    Odor     I have reviewed prior pt notes  I have reviewed notes from referring/previous physicians  I have reviewed urinalysis results  I have reviewed  prior PSA results  I have reviewed prior urine culture Impression/Assessment:  Worsening ED post prostatectomy. He is interested in starting on oral medications which I think would be an appropriate first step.  He has not had a follow-up PSA since PCa treatment   Plan:  1. PSA today -- will forward results.  2. Start on trial of sildenafil.   3. Return for OV in 1 yr w/ PSA prior

## 2019-09-13 LAB — PSA: PSA: <0.1 ng/mL (ref <=4.0)

## 2019-09-14 ENCOUNTER — Telehealth: Payer: Self-pay

## 2019-09-14 NOTE — Telephone Encounter (Signed)
Wife notified.

## 2019-09-14 NOTE — Telephone Encounter (Signed)
-----   Message from Franchot Gallo, MD sent at 09/13/2019  5:05 PM EDT ----- Notify patient PSA is 0. ----- Message ----- From: Dorisann Frames, RN Sent: 09/13/2019   8:57 AM EDT To: Franchot Gallo, MD  Please review

## 2019-09-15 ENCOUNTER — Telehealth: Payer: Self-pay

## 2019-09-15 NOTE — Telephone Encounter (Signed)
Notified pt's wife  

## 2020-09-02 NOTE — Progress Notes (Incomplete)
History of Present Illness: Presents for f/u of h/o PCa as well as ED.   1 - Moderate Risk Prostate Cancer - s/p robotic prostatecotmy + ICG + nodes 06/2017 for pT2cN0Mx Gleason 3+4=7 adenocarcinoma with negative margins. Pre-op PSA 5.8.    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 He had complete bladder neck preservation.   3 - Erectile Dysfunction - s/p prostatectomy 06/2017.   5.3.2022:   Past Medical History:  Diagnosis Date  . Bipolar 1 disorder ([place])    NO CURRENT MEDS  . Diabetes mellitus without complication ([place])    type 2   . Elevated cholesterol   . Glaucoma    LEFT EYE  . Hypertension   . Prostate cancer [facility])     Past Surgical History:  Procedure Laterality Date  . LYMPHADENECTOMY Bilateral 06/16/2017   Procedure: LYMPHADENECTOMY;  Surgeon: Alexis Frock, MD;  Location: [facility];  Service: Urology;  Laterality: Bilateral;  . PROSTATE BIOPSY    . ROBOT ASSISTED LAPAROSCOPIC RADICAL PROSTATECTOMY N/A 06/16/2017   Procedure: ROBOTIC ASSISTED LAPAROSCOPIC RADICAL PROSTATECTOMY;  Surgeon: Alexis Frock, MD;  Location: [facility];  Service: Urology;  Laterality: N/A;    Home Medications:  Allergies as of 09/03/2020      Reactions   Clotrimazole 3 [clotrimazole] Hives   cream      Medication List       Accurate as of Sep 02, 2020  8:03 PM. If you have any questions, ask your nurse or doctor.        acetaminophen 325 MG tablet Commonly known as: TYLENOL Take 2 tablets (650 mg total) by mouth every 6 (six) hours as needed for mild pain, fever or headache.   atorvastatin 20 MG tablet Commonly known as: LIPITOR Take 20 mg by mouth daily.   feeding supplement Liqd Take 237 mLs by mouth 2 (two) times daily between meals.   folic acid 1 MG tablet Commonly known as: FOLVITE Take 1 tablet (1 mg total) by mouth daily.   metFORMIN 500 MG tablet Commonly known as: GLUCOPHAGE Take 500 mg by mouth daily with breakfast.    multivitamin-iron-minerals-folic acid chewable tablet Chew 1 tablet by mouth daily.   polyethylene glycol 17 g packet Commonly known as: MIRALAX / GLYCOLAX Take 17 g by mouth daily.   senna-docusate 8.6-50 MG tablet Commonly known as: Senokot-S Take 1 tablet by mouth 2 (two) times daily. While taking pain meds to prevent constipation   sildenafil 100 MG tablet Commonly known as: VIAGRA Take 1 tablet (100 mg total) by mouth daily as needed for erectile dysfunction.   thiamine 100 MG tablet Take 1 tablet (100 mg total) by mouth daily.       Allergies:  Allergies  Allergen Reactions  . Clotrimazole 3 [Clotrimazole] Hives    cream    Family History  Problem Relation Age of Onset  . Cancer Mother        GYN problems    Social History:  reports that he has never smoked. He has never used smokeless tobacco. He reports that he does not drink alcohol and does not use drugs.  ROS: A complete review of systems was performed.  All systems are negative except for pertinent findings as noted.  Physical Exam:  Vital signs in last 24 hours: There were no vitals taken for this visit. Constitutional:  Alert and oriented, No acute distress Cardiovascular: Regular rate  Respiratory: Normal respiratory effort GI: Abdomen is soft, nontender, nondistended, no abdominal masses.  No CVAT.  Genitourinary: Normal male phallus, testes are descended bilaterally and non-tender and without masses, scrotum is normal in appearance without lesions or masses, perineum is normal on inspection. Lymphatic: No lymphadenopathy Neurologic: Grossly intact, no focal deficits Psychiatric: Normal mood and affect  I have reviewed prior pt notes  I have reviewed notes from referring/previous physicians  I have reviewed urinalysis results  I have independently reviewed prior imaging  I have reviewed prior PSA results    Impression/Assessment:  ***  Plan:  ***

## 2020-09-03 ENCOUNTER — Ambulatory Visit: Payer: Medicare Other | Admitting: Urology

## 2020-09-03 DIAGNOSIS — N521 Erectile dysfunction due to diseases classified elsewhere: Secondary | ICD-10-CM

## 2020-09-03 DIAGNOSIS — N5231 Erectile dysfunction following radical prostatectomy: Secondary | ICD-10-CM

## 2020-09-03 DIAGNOSIS — Z8546 Personal history of malignant neoplasm of prostate: Secondary | ICD-10-CM

## 2022-01-20 IMAGING — CT CT HEAD W/O CM
4 series · 16 of 47 positions shown, 18 images · non-contrast
Comparison: Head CT 05/07/2016

CLINICAL DATA: Cerebral hemorrhage suspected.

EXAM:
CT HEAD WITHOUT CONTRAST
TECHNIQUE: Contiguous axial images were obtained from the base of the skull
through the vertex without intravenous contrast.

[Series 3: head wo · axial · 0.40mm/px · z∈[+68,+188]mm · 7 of 32 slices shown, 9 images]
[im 4/32  brain]
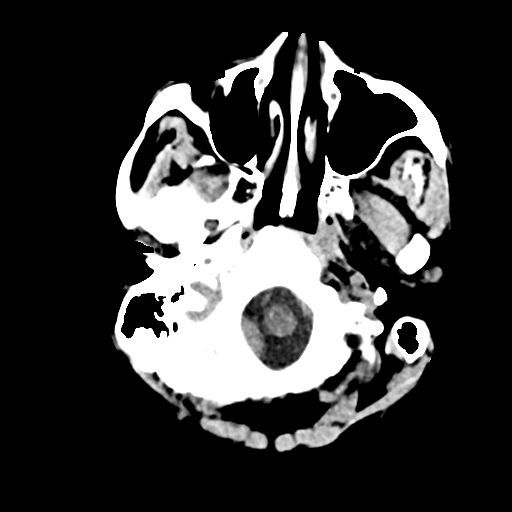
[im 4/32  bone]
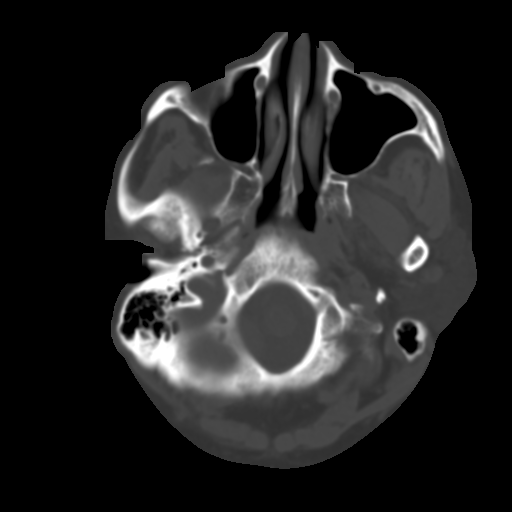
[im 8/32  brain]
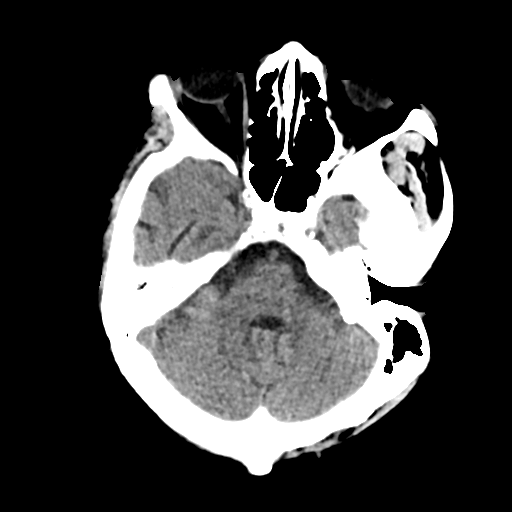
[im 12/32  brain]
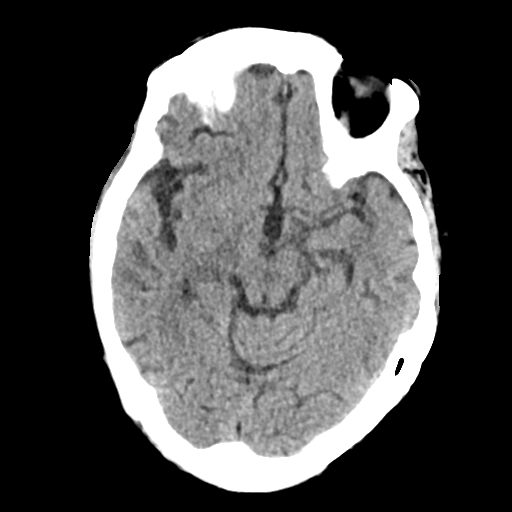
[im 16/32  brain]
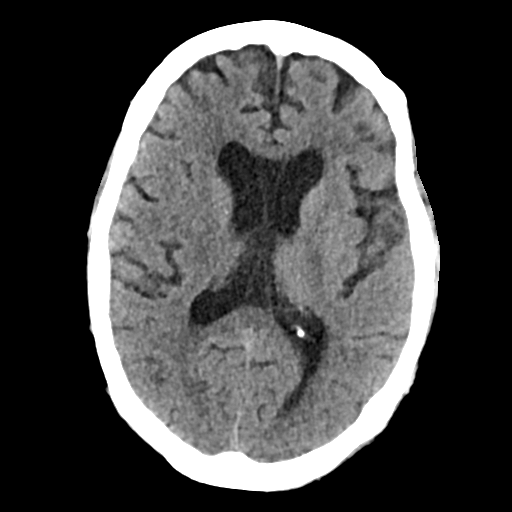
[im 20/32  brain]
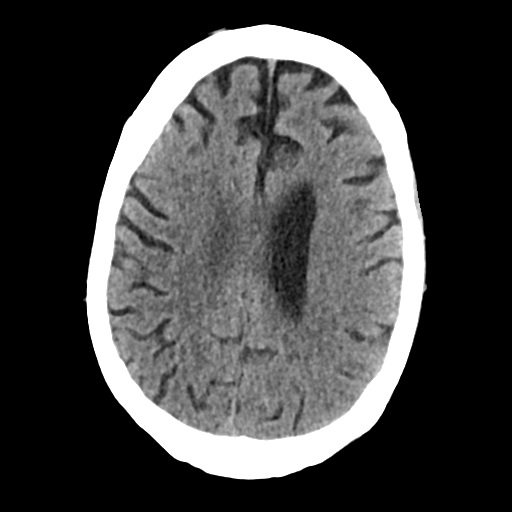
[im 20/32  bone]
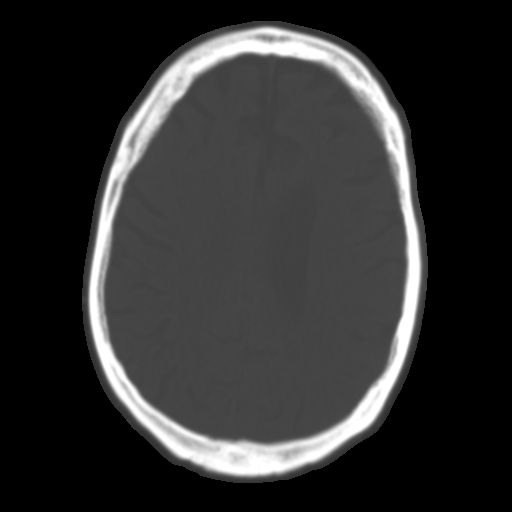
[im 24/32  brain]
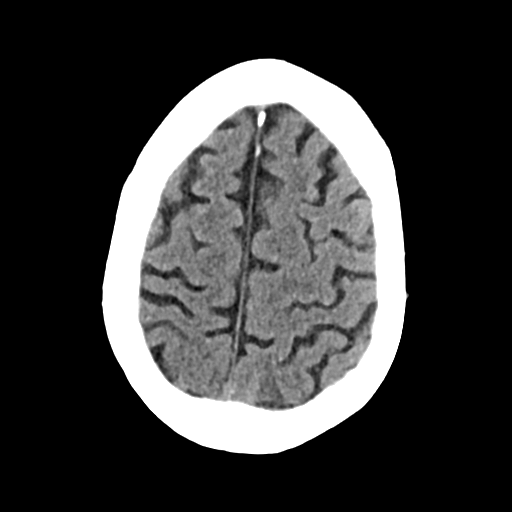
[im 28/32  brain]
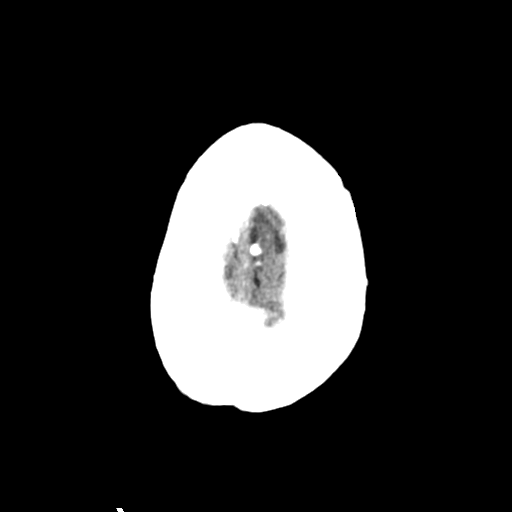

[Series 4: head bone · axial · 0.40mm/px · z∈[+68,+100]mm · 3 of 80 slices shown]
[im 8/80  bone]
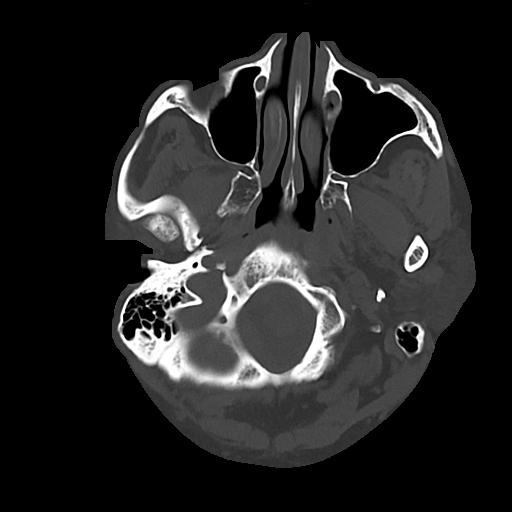
[im 16/80  bone]
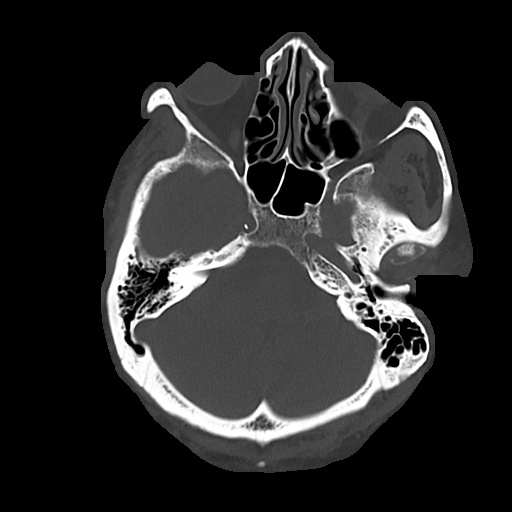
[im 24/80  bone]
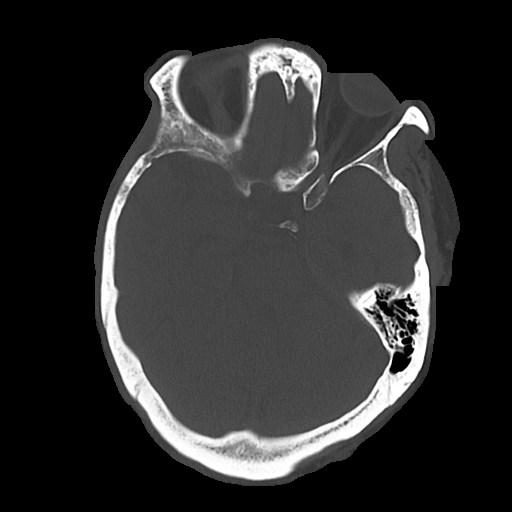

[Series 5: cor soft · coronal · 0.31mm/px · 3 of 70 slices shown]
[im 24/70  brain]
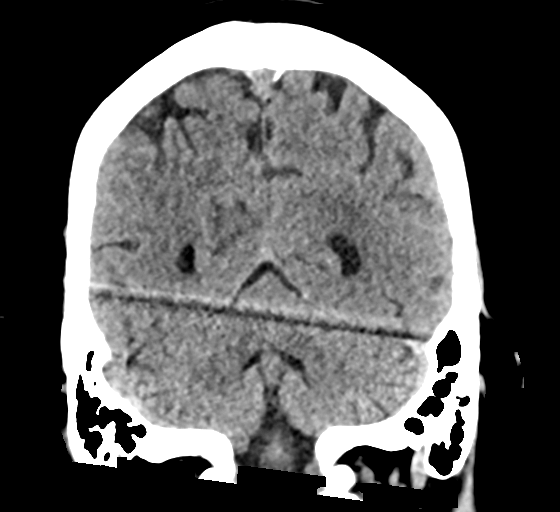
[im 31/70  brain]
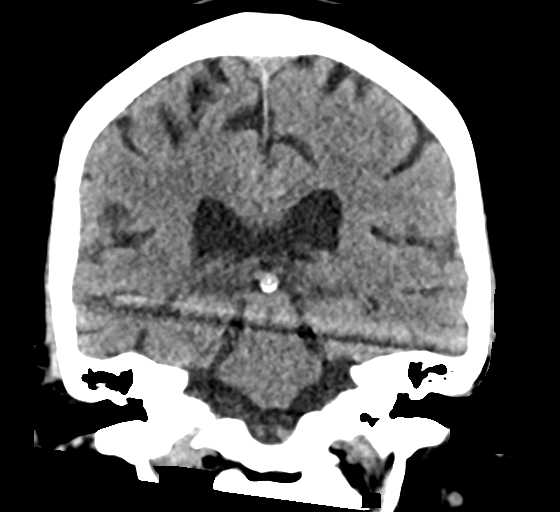
[im 39/70  brain]
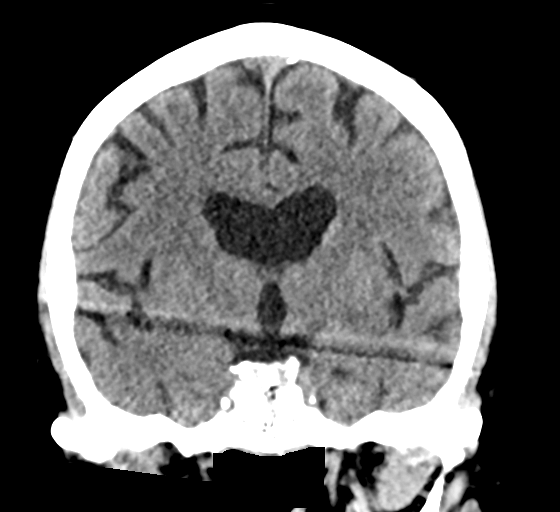

[Series 6: sag soft · sagittal · 0.31mm/px · 3 of 58 slices shown]
[im 21/58  brain]
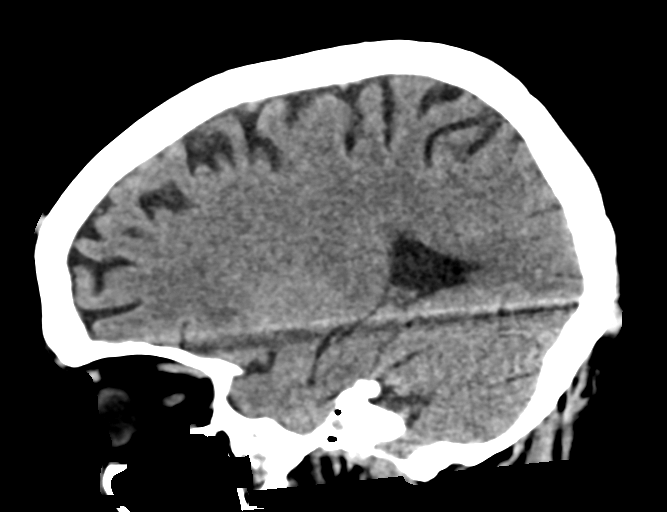
[im 29/58  brain]
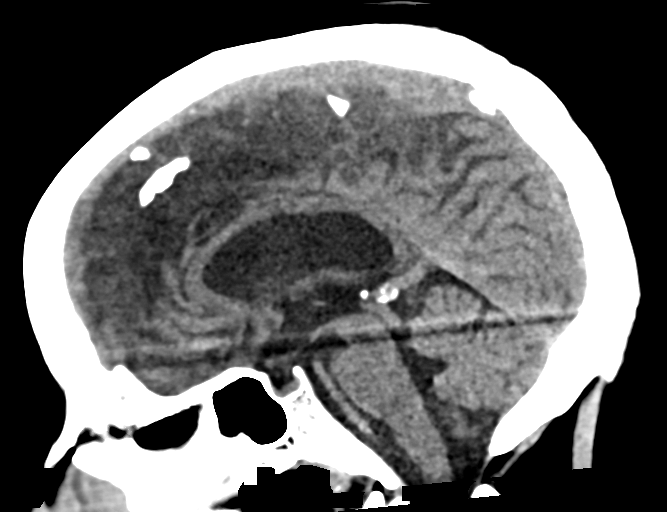
[im 37/58  brain]
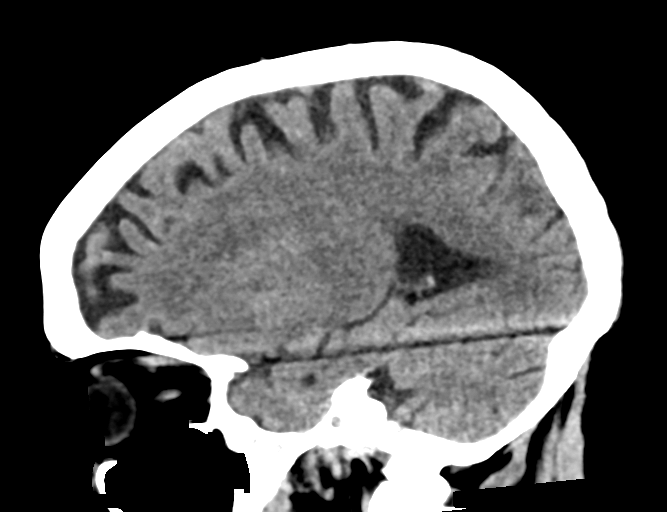

[16 of 47 positions shown; findings below may reference images not displayed]

FINDINGS: Brain:

No evidence of acute intracranial hemorrhage.

No demarcated cortical infarction.

No evidence of intracranial mass.

No midline shift or extra-axial fluid collection.

Mild ill-defined hypoattenuation within the cerebral white matter is
nonspecific, but consistent with chronic small vessel ischemic
disease.

Mild generalized parenchymal atrophy.

Vascular: No hyperdense vessel.  Atherosclerotic calcifications.

Skull: Normal. Negative for fracture or focal lesion.

Sinuses/Orbits: Mild ethmoid and left maxillary sinus mucosal
thickening. Small right maxillary sinus mucous retention cyst. No
significant mastoid effusion.
IMPRESSION: No CT evidence of acute intracranial abnormality.

Mild generalized parenchymal atrophy and chronic small vessel
ischemic disease.

## 2022-01-26 IMAGING — DX DG CHEST 1V PORT
1 series · 1 of 1 positions shown · non-contrast
Comparison: Chest radiograph dated 06/21/2019.

CLINICAL DATA: 59-year-old male with cough and dyspnea.

EXAM:
PORTABLE CHEST 1 VIEW

[chest ap]
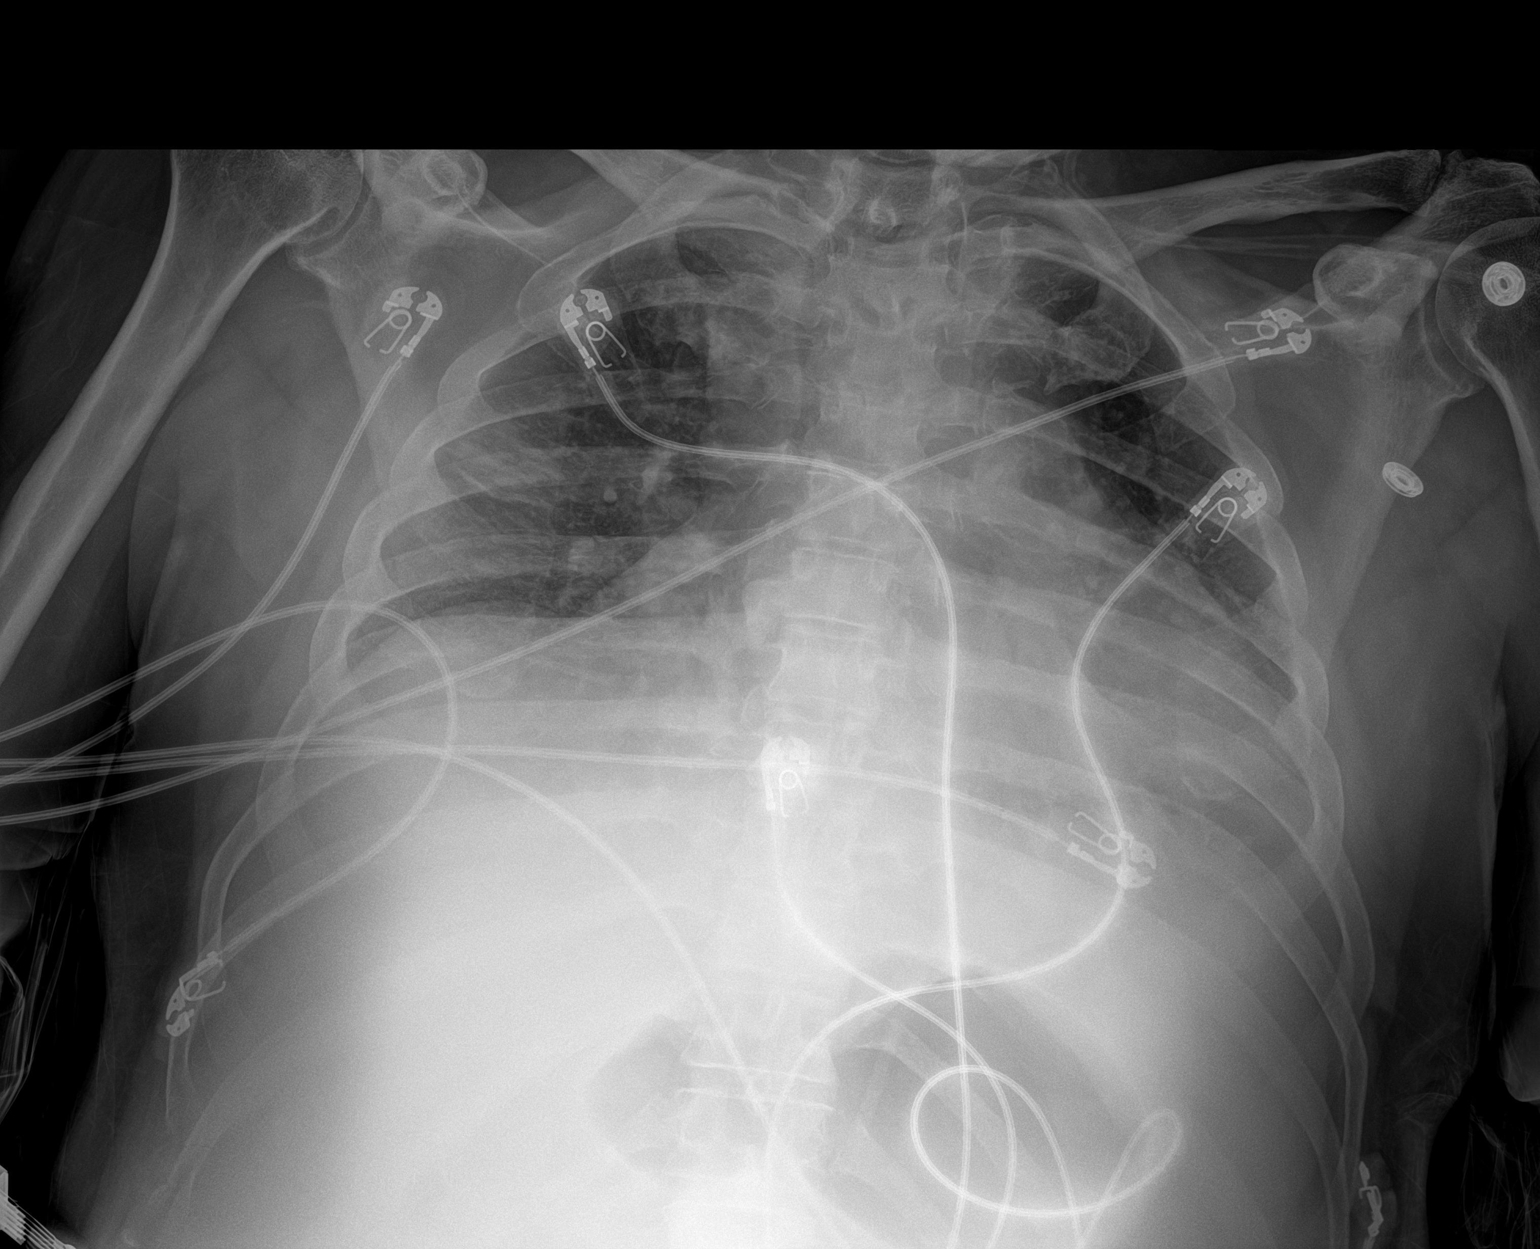

[1 of 1 positions shown; findings below may reference images not displayed]

FINDINGS: Interval removal of the endotracheal, and enteric tubes as well as
removal of the right IJ central venous line.

There is shallow inspiration with bibasilar atelectasis. Pneumonia
is not excluded. Faint densities in the right mid lung field appears
similar to prior radiograph. Patchy area of density at the left lung
base may represent atelectasis or infiltrate. A small left pleural
effusion may be present. No pneumothorax. Stable cardiac silhouette.
No acute osseous pathology.
IMPRESSION: 1. Interval removal of the support apparatus.
2. Shallow inspiration with bibasilar densities similar to prior
radiograph which may represent atelectasis or infiltrate.

## 2023-01-06 ENCOUNTER — Other Ambulatory Visit (HOSPITAL_COMMUNITY): Payer: Self-pay | Admitting: Nephrology

## 2023-01-06 DIAGNOSIS — E1122 Type 2 diabetes mellitus with diabetic chronic kidney disease: Secondary | ICD-10-CM

## 2023-01-13 ENCOUNTER — Ambulatory Visit (HOSPITAL_COMMUNITY)
Admission: RE | Admit: 2023-01-13 | Discharge: 2023-01-13 | Disposition: A | Discharge status: Home / Self Care | Payer: 59 | Source: Ambulatory Visit | Attending: Nephrology | Admitting: Nephrology

## 2023-01-13 DIAGNOSIS — N184 Chronic kidney disease, stage 4 (severe): Secondary | ICD-10-CM | POA: Diagnosis present

## 2023-01-13 DIAGNOSIS — E1122 Type 2 diabetes mellitus with diabetic chronic kidney disease: Secondary | ICD-10-CM | POA: Insufficient documentation

## 2023-01-21 ENCOUNTER — Other Ambulatory Visit (HOSPITAL_COMMUNITY)
Admission: RE | Admit: 2023-01-21 | Discharge: 2023-01-21 | Disposition: A | Discharge status: Home / Self Care | Payer: 59 | Source: Ambulatory Visit | Attending: Nephrology | Admitting: Nephrology

## 2023-01-21 DIAGNOSIS — N170 Acute kidney failure with tubular necrosis: Secondary | ICD-10-CM | POA: Diagnosis not present

## 2023-01-21 DIAGNOSIS — R809 Proteinuria, unspecified: Secondary | ICD-10-CM | POA: Diagnosis not present

## 2023-01-21 DIAGNOSIS — D638 Anemia in other chronic diseases classified elsewhere: Secondary | ICD-10-CM | POA: Insufficient documentation

## 2023-01-21 DIAGNOSIS — N184 Chronic kidney disease, stage 4 (severe): Secondary | ICD-10-CM | POA: Insufficient documentation

## 2023-01-21 DIAGNOSIS — I129 Hypertensive chronic kidney disease with stage 1 through stage 4 chronic kidney disease, or unspecified chronic kidney disease: Secondary | ICD-10-CM | POA: Diagnosis not present

## 2023-01-21 DIAGNOSIS — N189 Chronic kidney disease, unspecified: Secondary | ICD-10-CM | POA: Diagnosis present

## 2023-01-21 DIAGNOSIS — E1129 Type 2 diabetes mellitus with other diabetic kidney complication: Secondary | ICD-10-CM | POA: Diagnosis not present

## 2023-01-21 LAB — CBC
HCT: 42.3 % (ref 39.0–52.0)
Hemoglobin: 14.1 g/dL (ref 13.0–17.0)
MCH: 28.9 pg (ref 26.0–34.0)
MCHC: 33.3 g/dL (ref 30.0–36.0)
MCV: 86.7 fL (ref 80.0–100.0)
Platelets: 239 10*3/uL (ref 150–400)
RBC: 4.88 MIL/uL (ref 4.22–5.81)
RDW: 11.9 % (ref 11.5–15.5)
WBC: 4.9 10*3/uL (ref 4.0–10.5)
nRBC: 0 % (ref 0.0–0.2)

## 2023-01-21 LAB — IRON AND TIBC
Iron: 88 ug/dL (ref 45–182)
Saturation Ratios: 22 % (ref 17.9–39.5)
TIBC: 406 ug/dL (ref 250–450)
UIBC: 318 ug/dL

## 2023-01-21 LAB — FOLATE: Folate: 7.7 ng/mL (ref >5.9)

## 2023-01-21 LAB — RENAL FUNCTION PANEL
Albumin: 4.8 g/dL (ref 3.5–5.0)
Anion gap: 13 (ref 5–15)
BUN: 23 mg/dL (ref 8–23)
CO2: 21 mmol/L — ABNORMAL LOW (ref 22–32)
Calcium: 9.6 mg/dL (ref 8.9–10.3)
Chloride: 102 mmol/L (ref 98–111)
Creatinine, Ser: 2.26 mg/dL — ABNORMAL HIGH (ref 0.61–1.24)
GFR, Estimated: 32 mL/min — ABNORMAL LOW (ref >60)
Glucose, Bld: 105 mg/dL — ABNORMAL HIGH (ref 70–99)
Phosphorus: 2.7 mg/dL (ref 2.5–4.6)
Potassium: 4 mmol/L (ref 3.5–5.1)
Sodium: 136 mmol/L (ref 135–145)

## 2023-01-21 LAB — MAGNESIUM: Magnesium: 1.9 mg/dL (ref 1.7–2.4)

## 2023-01-21 LAB — FERRITIN: Ferritin: 111 ng/mL (ref 24–336)

## 2023-01-21 LAB — VITAMIN D 25 HYDROXY (VIT D DEFICIENCY, FRACTURES): Vit D, 25-Hydroxy: 23.63 ng/mL — ABNORMAL LOW (ref 30–100)

## 2023-01-21 LAB — HEPATITIS B SURFACE ANTIBODY,QUALITATIVE: Hep B S Ab: REACTIVE — AB

## 2023-01-21 LAB — HEPATITIS B CORE ANTIBODY, TOTAL: Hep B Core Total Ab: NONREACTIVE

## 2023-01-21 LAB — URIC ACID: Uric Acid, Serum: 6.3 mg/dL (ref 3.7–8.6)

## 2023-01-21 LAB — HEPATITIS B SURFACE ANTIGEN: Hepatitis B Surface Ag: NONREACTIVE

## 2023-01-21 LAB — VITAMIN B12: Vitamin B-12: 696 pg/mL (ref 180–914)

## 2023-01-22 LAB — HEMOGLOBIN A1C
Hgb A1c MFr Bld: 6.1 % — ABNORMAL HIGH (ref 4.8–5.6)
Mean Plasma Glucose: 128 mg/dL

## 2023-01-22 LAB — PARATHYROID HORMONE, INTACT (NO CA): PTH: 20 pg/mL (ref 15–65)

## 2023-01-25 LAB — MISC LABCORP TEST (SEND OUT): Labcorp test code: 141330

## 2023-01-27 LAB — PROTEIN ELECTROPHORESIS, SERUM
A/G Ratio: 1.3 (ref 0.7–1.7)
Albumin ELP: 4.3 g/dL (ref 2.9–4.4)
Alpha-1-Globulin: 0.3 g/dL (ref 0.0–0.4)
Alpha-2-Globulin: 0.9 g/dL (ref 0.4–1.0)
Beta Globulin: 1.2 g/dL (ref 0.7–1.3)
Gamma Globulin: 1.1 g/dL (ref 0.4–1.8)
Globulin, Total: 3.4 g/dL (ref 2.2–3.9)
Total Protein ELP: 7.7 g/dL (ref 6.0–8.5)

## 2023-01-27 LAB — C4 COMPLEMENT: Complement C4, Body Fluid: 35 mg/dL (ref 12–38)

## 2023-01-27 LAB — HIV-1/HIV-2 QUAL RNA
HIV-1 RNA, Qualitative: NONREACTIVE
HIV-2 RNA, Qualitative: NONREACTIVE

## 2023-01-27 LAB — C3 COMPLEMENT: C3 Complement: 124 mg/dL (ref 82–167)

## 2023-01-27 LAB — ANTINUCLEAR ANTIBODIES, IFA: ANA Ab, IFA: NEGATIVE

## 2023-02-01 LAB — MISC LABCORP TEST (SEND OUT): Labcorp test code: 120256

## 2023-04-06 NOTE — Progress Notes (Signed)
[facility] [address] [place] [address]   Clinic Day:  04/07/2023  Referring physician: [facility]  Patient Care Team: [facility] as PCP - General   ASSESSMENT & PLAN:   Assessment:  1.  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 Wolfgang Phoenix - CKD stage IIIb since 2018, secondary to diabetes/hypertension.  Nonnephrotic range proteinuria. - 01/21/2023: SPEP-no M spike, immunofixation-unremarkable, kappa light chains 47.3 (3.3-19.4), lambda light chains 32.8, ratio 1.44, Hb-14.5, creatinine 2.26, calcium 9.6, albumin 4.8 - 24-hour urine (01/05/2023): M spike not observed, protein 252 mg / 24-hour, urine immunofixation positive for Bence-Jones proteins, lambda type - Has constant numbness in the feet for the past 2 years, thought to be from diabetes. - No B symptoms or new pains.  2. Social/family history: - Lives at home with his wife.  Retired after working at [employer].  No exposure to chemicals or pesticides.  Non-smoker. - Mother had uterine cancer.  No family history of plasma cell myeloma.  Plan:  1.  Lambda type Bence-Jones proteinuria: - We discussed the spectrum of plasma cell disorders in general and MGUS in particular. - We have discussed prognosis with 1 %/year transformation to multiple myeloma. - Discussed surveillance plan every 6 months with blood and urine tests and skeletal survey once a year. - RTC 6 months with repeat SPEP, immunofixation, free light chains, LDH, beta-2 microglobulin, 24-hour urine for total protein, immunofixation, UPEP, UFLC.  Will also obtain skeletal survey prior to next visit.   Orders Placed This Encounter  Procedures   DG Bone Survey Met    Standing Status:   Future    Standing Expiration Date:   04/06/2024    Order Specific Question:   Reason for Exam (SYMPTOM  OR DIAGNOSIS REQUIRED)    Answer:   MGUS     Order Specific Question:   Preferred imaging location?    Answer:   [facility]   CBC with Differential    Standing Status:   Future    Standing Expiration Date:   04/06/2024   Comprehensive metabolic panel    Standing Status:   Future    Standing Expiration Date:   04/06/2024   Kappa/lambda light chains    Standing Status:   Future    Standing Expiration Date:   04/06/2024   Immunofixation electrophoresis    Standing Status:   Future    Standing Expiration Date:   04/06/2024   Protein electrophoresis, serum    Standing Status:   Future    Standing Expiration Date:   04/06/2024   24 Hr Urn UIFE/Light Chains/TP QN    Standing Status:   Future    Standing Expiration Date:   04/06/2024   Beta 2 microglobuline, serum    Standing Status:   Future    Standing Expiration Date:   04/06/2024   Lactate dehydrogenase    Standing Status:   Future    Standing Expiration Date:   04/06/2024      I,Katie Daubenspeck,acting as a scribe for Doreatha Massed, MD.,have documented all relevant documentation on the behalf of Doreatha Massed, MD,as directed by  Doreatha Massed, MD while in the presence of Doreatha Massed, MD.   I, Doreatha Massed MD, have reviewed the above documentation for accuracy and completeness, and I agree with the above.   Doreatha Massed, MD   12/4/20249:32 AM  CHIEF COMPLAINT/PURPOSE OF  CONSULT:   Diagnosis: Bence-Jones proteinuria  Current Therapy: Under workup  HISTORY OF PRESENT ILLNESS:   Scott Mcgee is a 62 y.o. male presenting to clinic today for evaluation of Bence-Jones proteinuria at the request of Dr. Wolfgang Phoenix.  He has a history of stage 3b CKD, initially diagnosed in 2018. He recently submitted a 24 hour urine collection on 01/05/23. The results revealed a calculated urine protein of 252. Additionally, immunofixation result was also abnormal, revealing Bence Jones protein positivity, lambda type.  Today, he states that he is doing well  overall. His appetite level is at 100%. His energy level is at 100%.  PAST MEDICAL HISTORY:   Past Medical History: Past Medical History:  Diagnosis Date   Bipolar 1 disorder ([facility])    NO CURRENT MEDS   Diabetes mellitus without complication ([facility])    type 2    Elevated cholesterol    Glaucoma    LEFT EYE   Hypertension    Prostate cancer [facility])     Surgical History: Past Surgical History:  Procedure Laterality Date   LYMPHADENECTOMY Bilateral 06/16/2017   Procedure: LYMPHADENECTOMY;  Surgeon: Sebastian Ache, MD;  Location: [facility];  Service: Urology;  Laterality: Bilateral;   PROSTATE BIOPSY     ROBOT ASSISTED LAPAROSCOPIC RADICAL PROSTATECTOMY N/A 06/16/2017   Procedure: ROBOTIC ASSISTED LAPAROSCOPIC RADICAL PROSTATECTOMY;  Surgeon: Sebastian Ache, MD;  Location: [facility];  Service: Urology;  Laterality: N/A;    Social History: Social History   Socioeconomic History   Marital status: Married    Spouse name: Para March    Number of children: 2   Years of education: Not on file   Highest education level: Not on file  Occupational History   Not on file  Tobacco Use   Smoking status: Never   Smokeless tobacco: Never  Vaping Use   Vaping status: Never Used  Substance and Sexual Activity   Alcohol use: No   Drug use: No   Sexual activity: Not on file  Other Topics Concern   Not on file  Social History Narrative   Not on file   Social Determinants of Health   Financial Resource Strain: Not on file  Food Insecurity: No Food Insecurity (04/07/2023)   Hunger Vital Sign    Worried About Running Out of Food in the Last Year: Never true    Ran Out of Food in the Last Year: Never true  Transportation Needs: No Transportation Needs (04/07/2023)   PRAPARE - Administrator, Civil Service (Medical): No    Lack of Transportation (Non-Medical): No  Physical Activity: Not on file  Stress: Not on file  Social Connections: Not on file  Intimate Partner Violence: Not At  Risk (04/07/2023)   Humiliation, Afraid, Rape, and Kick questionnaire    Fear of Current or Ex-Partner: No    Emotionally Abused: No    Physically Abused: No    Sexually Abused: No    Family History: Family History  Problem Relation Age of Onset   Cancer Mother        GYN problems    Current Medications:  Current Outpatient Medications:    acetaminophen (TYLENOL) 325 MG tablet, Take 2 tablets (650 mg total) by mouth every 6 (six) hours as needed for mild pain, fever or headache., Disp:  , Rfl:    atorvastatin (LIPITOR) 20 MG tablet, Take 20 mg by mouth daily. , Disp: , Rfl:    feeding supplement, ENSURE ENLIVE, (ENSURE ENLIVE) LIQD, Take 237 mLs  by mouth 2 (two) times daily between meals., Disp: 237 mL, Rfl: 12   folic acid (FOLVITE) 1 MG tablet, Take 1 tablet (1 mg total) by mouth daily., Disp: 30 tablet, Rfl: 0   metFORMIN (GLUCOPHAGE) 500 MG tablet, Take 500 mg by mouth daily with breakfast. , Disp: , Rfl:    multivitamin-iron-minerals-folic acid (CENTRUM) chewable tablet, Chew 1 tablet by mouth daily., Disp: , Rfl:    polyethylene glycol (MIRALAX / GLYCOLAX) 17 g packet, Take 17 g by mouth daily., Disp: , Rfl:    senna-docusate (SENOKOT-S) 8.6-50 MG tablet, Take 1 tablet by mouth 2 (two) times daily. While taking pain meds to prevent constipation, Disp: 20 tablet, Rfl: 0   sildenafil (VIAGRA) 100 MG tablet, Take 1 tablet (100 mg total) by mouth daily as needed for erectile dysfunction., Disp: 10 tablet, Rfl: prn   thiamine 100 MG tablet, Take 1 tablet (100 mg total) by mouth daily., Disp: 30 tablet, Rfl: 0   Allergies: Allergies  Allergen Reactions   Clotrimazole 3 [Clotrimazole] Hives    cream    REVIEW OF SYSTEMS:   Review of Systems  Constitutional:  Negative for chills, fatigue and fever.  HENT:   Negative for lump/mass, mouth sores, nosebleeds, sore throat and trouble swallowing.   Eyes:  Negative for eye problems.  Respiratory:  Negative for cough and shortness of  breath.   Cardiovascular:  Negative for chest pain, leg swelling and palpitations.  Gastrointestinal:  Positive for constipation. Negative for abdominal pain, diarrhea, nausea and vomiting.  Genitourinary:  Negative for bladder incontinence, difficulty urinating, dysuria, frequency, hematuria and nocturia.   Musculoskeletal:  Negative for arthralgias, back pain, flank pain, myalgias and neck pain.  Skin:  Negative for itching and rash.  Neurological:  Positive for numbness. Negative for dizziness and headaches.  Hematological:  Does not bruise/bleed easily.  Psychiatric/Behavioral:  Negative for depression, sleep disturbance and suicidal ideas. The patient is not nervous/anxious.   All other systems reviewed and are negative.    VITALS:   Blood pressure (!) 143/72, pulse 77, temperature 97.6 F (36.4 C), temperature source Tympanic, resp. rate 18, height 5\' 4"  (1.626 m), weight 154 lb 12.8 oz (70.2 kg), SpO2 99%.  Wt Readings from Last 3 Encounters:  04/07/23 154 lb 12.8 oz (70.2 kg)  09/12/19 167 lb (75.8 kg)  06/28/19 163 lb 2.3 oz (74 kg)    Body mass index is 26.57 kg/m.   PHYSICAL EXAM:   Physical Exam Vitals and nursing note reviewed. Exam conducted with a chaperone present.  Constitutional:      Appearance: Normal appearance.  Cardiovascular:     Rate and Rhythm: Normal rate and regular rhythm.     Pulses: Normal pulses.     Heart sounds: Normal heart sounds.  Pulmonary:     Effort: Pulmonary effort is normal.     Breath sounds: Normal breath sounds.  Abdominal:     Palpations: Abdomen is soft. There is no hepatomegaly, splenomegaly or mass.     Tenderness: There is no abdominal tenderness.  Musculoskeletal:     Right lower leg: No edema.     Left lower leg: No edema.  Lymphadenopathy:     Cervical: No cervical adenopathy.     Right cervical: No superficial, deep or posterior cervical adenopathy.    Left cervical: No superficial, deep or posterior cervical  adenopathy.     Upper Body:     Right upper body: No supraclavicular or axillary adenopathy.  Left upper body: No supraclavicular or axillary adenopathy.  Neurological:     General: No focal deficit present.     Mental Status: He is alert and oriented to person, place, and time.  Psychiatric:        Mood and Affect: Mood normal.        Behavior: Behavior normal.     LABS:      Latest Ref Rng & Units 01/21/2023    9:02 AM 06/29/2019    3:32 AM 06/28/2019    3:04 AM  CBC  WBC 4.0 - 10.5 K/uL 4.9  8.2  8.8   Hemoglobin 13.0 - 17.0 g/dL 62.1  8.4  8.5   Hematocrit 39.0 - 52.0 % 42.3  26.6  26.8   Platelets 150 - 400 K/uL 239  404  418       Latest Ref Rng & Units 01/21/2023    9:02 AM 06/29/2019    3:32 AM 06/28/2019    3:04 AM  CMP  Glucose 70 - 99 mg/dL 308  657  846   BUN 8 - 23 mg/dL 23  15  13    Creatinine 0.61 - 1.24 mg/dL 9.62  9.52  8.41   Sodium 135 - 145 mmol/L 136  138  142   Potassium 3.5 - 5.1 mmol/L 4.0  3.9  3.7   Chloride 98 - 111 mmol/L 102  108  110   CO2 22 - 32 mmol/L 21  20  22    Calcium 8.9 - 10.3 mg/dL 9.6  9.0  9.1   Total Protein 6.5 - 8.1 g/dL   6.7   Total Bilirubin 0.3 - 1.2 mg/dL   1.9   Alkaline Phos 38 - 126 U/L   659   AST 15 - 41 U/L   57   ALT 0 - 44 U/L   45      No results found for: "CEA1", "CEA" / No results found for: "CEA1", "CEA" No results found for: "PSA1" No results found for: "CAN199" No results found for: "CAN125"  Lab Results  Component Value Date   TOTALPROTELP 7.7 01/21/2023   ALBUMINELP 4.3 01/21/2023   A1GS 0.3 01/21/2023   A2GS 0.9 01/21/2023   BETS 1.2 01/21/2023   GAMS 1.1 01/21/2023   MSPIKE Not Observed 01/21/2023   SPEI Comment 01/21/2023   Lab Results  Component Value Date   TIBC 406 01/21/2023   TIBC 252 06/22/2019   FERRITIN 111 01/21/2023   FERRITIN 918 (H) 06/22/2019   IRONPCTSAT 22 01/21/2023   IRONPCTSAT 35 06/22/2019   Lab Results  Component Value Date   LDH 180 06/23/2019      STUDIES:   No results found.

## 2023-04-07 ENCOUNTER — Encounter: Payer: Self-pay | Admitting: Hematology

## 2023-04-07 ENCOUNTER — Inpatient Hospital Stay: Payer: 59 | Attending: Hematology | Admitting: Hematology

## 2023-04-07 ENCOUNTER — Other Ambulatory Visit: Payer: 59

## 2023-04-07 VITALS — BP 143/72 | HR 77 | Temp 97.6°F | Resp 18 | Ht 64.0 in | Wt 154.8 lb

## 2023-04-07 DIAGNOSIS — R809 Proteinuria, unspecified: Secondary | ICD-10-CM | POA: Insufficient documentation

## 2023-04-07 DIAGNOSIS — R803 Bence Jones proteinuria: Secondary | ICD-10-CM

## 2023-04-07 DIAGNOSIS — E1122 Type 2 diabetes mellitus with diabetic chronic kidney disease: Secondary | ICD-10-CM | POA: Insufficient documentation

## 2023-04-07 DIAGNOSIS — D472 Monoclonal gammopathy: Secondary | ICD-10-CM

## 2023-04-07 DIAGNOSIS — N1832 Chronic kidney disease, stage 3b: Secondary | ICD-10-CM | POA: Insufficient documentation

## 2023-04-07 DIAGNOSIS — I129 Hypertensive chronic kidney disease with stage 1 through stage 4 chronic kidney disease, or unspecified chronic kidney disease: Secondary | ICD-10-CM | POA: Diagnosis not present

## 2023-04-07 NOTE — Patient Instructions (Addendum)
You were seen and examined today by Dr. Ellin Saba. Dr. Ellin Saba is a hematologist, meaning that he specializes in blood abnormalities. Dr. Ellin Saba discussed your past medical history, family history of cancers/blood conditions and the events that led to you being here today.  You were referred to Dr. Ellin Saba due to monoclonal gammopathy of unknown significance (MGUS).  We will see you back in 6 months. We will repeat lab work prior to this visit.   Follow-up as scheduled.

## 2023-04-19 ENCOUNTER — Other Ambulatory Visit: Payer: Self-pay

## 2023-04-19 ENCOUNTER — Emergency Department (HOSPITAL_COMMUNITY): Payer: 59

## 2023-04-19 ENCOUNTER — Encounter (HOSPITAL_COMMUNITY): Payer: Self-pay

## 2023-04-19 ENCOUNTER — Emergency Department (HOSPITAL_COMMUNITY)
Admission: EM | Admit: 2023-04-19 | Discharge: 2023-04-19 | Disposition: A | Discharge status: Home / Self Care | Payer: 59 | Attending: Emergency Medicine | Admitting: Emergency Medicine

## 2023-04-19 DIAGNOSIS — I10 Essential (primary) hypertension: Secondary | ICD-10-CM | POA: Insufficient documentation

## 2023-04-19 DIAGNOSIS — E119 Type 2 diabetes mellitus without complications: Secondary | ICD-10-CM | POA: Diagnosis not present

## 2023-04-19 DIAGNOSIS — Z8546 Personal history of malignant neoplasm of prostate: Secondary | ICD-10-CM | POA: Insufficient documentation

## 2023-04-19 DIAGNOSIS — Z7984 Long term (current) use of oral hypoglycemic drugs: Secondary | ICD-10-CM | POA: Diagnosis not present

## 2023-04-19 DIAGNOSIS — R55 Syncope and collapse: Secondary | ICD-10-CM

## 2023-04-19 DIAGNOSIS — R42 Dizziness and giddiness: Secondary | ICD-10-CM | POA: Diagnosis present

## 2023-04-19 DIAGNOSIS — E860 Dehydration: Secondary | ICD-10-CM | POA: Diagnosis not present

## 2023-04-19 LAB — COMPREHENSIVE METABOLIC PANEL
ALT: 70 U/L — ABNORMAL HIGH (ref 0–44)
AST: 59 U/L — ABNORMAL HIGH (ref 15–41)
Albumin: 4.5 g/dL (ref 3.5–5.0)
Alkaline Phosphatase: 141 U/L — ABNORMAL HIGH (ref 38–126)
Anion gap: 10 (ref 5–15)
BUN: 26 mg/dL — ABNORMAL HIGH (ref 8–23)
CO2: 23 mmol/L (ref 22–32)
Calcium: 10.5 mg/dL — ABNORMAL HIGH (ref 8.9–10.3)
Chloride: 102 mmol/L (ref 98–111)
Creatinine, Ser: 1.91 mg/dL — ABNORMAL HIGH (ref 0.61–1.24)
GFR, Estimated: 39 mL/min — ABNORMAL LOW (ref >60)
Glucose, Bld: 117 mg/dL — ABNORMAL HIGH (ref 70–99)
Potassium: 4.2 mmol/L (ref 3.5–5.1)
Sodium: 135 mmol/L (ref 135–145)
Total Bilirubin: 0.8 mg/dL (ref <1.2)
Total Protein: 7.7 g/dL (ref 6.5–8.1)

## 2023-04-19 LAB — CBC WITH DIFFERENTIAL/PLATELET
Abs Immature Granulocytes: 0.01 10*3/uL (ref 0.00–0.07)
Basophils Absolute: 0 10*3/uL (ref 0.0–0.1)
Basophils Relative: 1 %
Eosinophils Absolute: 0.2 10*3/uL (ref 0.0–0.5)
Eosinophils Relative: 3 %
HCT: 43.2 % (ref 39.0–52.0)
Hemoglobin: 14.3 g/dL (ref 13.0–17.0)
Immature Granulocytes: 0 %
Lymphocytes Relative: 31 %
Lymphs Abs: 1.5 10*3/uL (ref 0.7–4.0)
MCH: 27.7 pg (ref 26.0–34.0)
MCHC: 33.1 g/dL (ref 30.0–36.0)
MCV: 83.6 fL (ref 80.0–100.0)
Monocytes Absolute: 0.3 10*3/uL (ref 0.1–1.0)
Monocytes Relative: 6 %
Neutro Abs: 2.8 10*3/uL (ref 1.7–7.7)
Neutrophils Relative %: 59 %
Platelets: 217 10*3/uL (ref 150–400)
RBC: 5.17 MIL/uL (ref 4.22–5.81)
RDW: 12.6 % (ref 11.5–15.5)
WBC: 4.8 10*3/uL (ref 4.0–10.5)
nRBC: 0 % (ref 0.0–0.2)

## 2023-04-19 LAB — TROPONIN I (HIGH SENSITIVITY): Troponin I (High Sensitivity): 6 ng/L (ref <18)

## 2023-04-19 LAB — MAGNESIUM: Magnesium: 2.1 mg/dL (ref 1.7–2.4)

## 2023-04-19 LAB — LIPASE, BLOOD: Lipase: 41 U/L (ref 11–51)

## 2023-04-19 MED ORDER — LACTATED RINGERS IV BOLUS
1000.0000 mL | Freq: Once | INTRAVENOUS | Status: AC
  Administered 2023-04-19: 1000 mL via INTRAVENOUS

## 2023-04-19 NOTE — ED Notes (Signed)
BP Standing 88/57 MAP 68

## 2023-04-19 NOTE — ED Provider Notes (Signed)
[place] EMERGENCY DEPARTMENT AT [facility] Provider Note   CSN: [id number] Arrival date & time: 04/19/23  6578     History  Chief Complaint  Patient presents with   Constipation   Dizziness    Scott Mcgee is a 62 y.o. male.  HPI Patient presents for constipation and near syncopal symptoms.  Medical history includes DM, bipolar disorder, HTN, prostate cancer.  Last bowel movement was 3 days ago.  Patient has tried a stool softener.  He does not recall the name of it.  He does feel that he has some abdominal bloating sensation.  This is worsened postprandially.  He will have occasional postprandial nausea as well.  In terms of his dizziness, he states that it has been ongoing for the past month.  It has been worsening.  Previously, it would occur in the mornings and improves throughout the day.  Lately, it has been persistent throughout the day.  It is worse with standing, ambulation, and positional changes.  He describes as a lightheadedness rather than a room spinning sensation.  He is not aware of any recent blood loss.  He was previously prescribed blood pressure medications but has not been advised to stop taking it.  Currently, at rest, he denies any dizziness or nausea.  He denies any areas of pain or discomfort.    Home Medications Prior to Admission medications   Medication Sig Start Date End Date Taking? Authorizing Provider  acetaminophen (TYLENOL) 325 MG tablet Take 2 tablets (650 mg total) by mouth every 6 (six) hours as needed for mild pain, fever or headache. 06/29/19   Elgergawy, Leana Roe, MD  atorvastatin (LIPITOR) 20 MG tablet Take 20 mg by mouth daily.  02/24/17   [provider]  feeding supplement, ENSURE ENLIVE, (ENSURE ENLIVE) LIQD Take 237 mLs by mouth 2 (two) times daily between meals. 06/30/19   Elgergawy, Leana Roe, MD  folic acid (FOLVITE) 1 MG tablet Take 1 tablet (1 mg total) by mouth daily. 06/30/19   Elgergawy, Leana Roe, MD  metFORMIN  (GLUCOPHAGE) 500 MG tablet Take 500 mg by mouth daily with breakfast.  02/24/17   [provider]  multivitamin-iron-minerals-folic acid (CENTRUM) chewable tablet Chew 1 tablet by mouth daily.    [provider]  polyethylene glycol (MIRALAX / GLYCOLAX) 17 g packet Take 17 g by mouth daily.    [provider]  senna-docusate (SENOKOT-S) 8.6-50 MG tablet Take 1 tablet by mouth 2 (two) times daily. While taking pain meds to prevent constipation 06/17/17   Berneice Heinrich Delbert Phenix., MD  sildenafil (VIAGRA) 100 MG tablet Take 1 tablet (100 mg total) by mouth daily as needed for erectile dysfunction. 09/12/19   Marcine Matar, MD  thiamine 100 MG tablet Take 1 tablet (100 mg total) by mouth daily. 06/30/19   Elgergawy, Leana Roe, MD      Allergies    Clotrimazole 3 [clotrimazole]    Review of Systems   Review of Systems  Constitutional:  Positive for appetite change.  Gastrointestinal:  Positive for abdominal distention, constipation and nausea.  Neurological:  Positive for light-headedness.  All other systems reviewed and are negative.   Physical Exam Updated Vital Signs BP (!) 159/71 (BP Location: Left Arm)   Pulse (!) 53   Temp 97.7 F (36.5 C) (Oral)   Resp 16   Ht 5\' 5"  (1.651 m)   Wt 72.6 kg   SpO2 99%   BMI 26.63 kg/m  Physical Exam Vitals  and nursing note reviewed.  Constitutional:      General: He is not in acute distress.    Appearance: Normal appearance. He is well-developed. He is not ill-appearing, toxic-appearing or diaphoretic.  HENT:     Head: Normocephalic and atraumatic.     Right Ear: External ear normal.     Left Ear: External ear normal.     Nose: Nose normal.     Mouth/Throat:     Mouth: Mucous membranes are moist.  Eyes:     Extraocular Movements: Extraocular movements intact.     Conjunctiva/sclera: Conjunctivae normal.  Cardiovascular:     Rate and Rhythm: Normal rate and regular rhythm.  Pulmonary:     Effort: Pulmonary  effort is normal. No respiratory distress.  Abdominal:     General: There is no distension.     Palpations: Abdomen is soft.     Tenderness: There is no abdominal tenderness.  Musculoskeletal:        General: No swelling. Normal range of motion.     Cervical back: Normal range of motion and neck supple.  Skin:    General: Skin is warm and dry.     Coloration: Skin is not jaundiced or pale.  Neurological:     General: No focal deficit present.     Mental Status: He is alert and oriented to person, place, and time.     Cranial Nerves: No cranial nerve deficit.     Sensory: No sensory deficit.     Motor: No weakness.     Coordination: Coordination normal.  Psychiatric:        Mood and Affect: Mood normal.        Behavior: Behavior normal.     ED Results / Procedures / Treatments   Labs (all labs ordered are listed, but only abnormal results are displayed) Labs Reviewed  COMPREHENSIVE METABOLIC PANEL - Abnormal; Notable for the following components:      Result Value   Glucose, Bld 117 (*)    BUN 26 (*)    Creatinine, Ser 1.91 (*)    Calcium 10.5 (*)    AST 59 (*)    ALT 70 (*)    Alkaline Phosphatase 141 (*)    GFR, Estimated 39 (*)    All other components within normal limits  CBC WITH DIFFERENTIAL/PLATELET  MAGNESIUM  LIPASE, BLOOD  URINALYSIS, ROUTINE W REFLEX MICROSCOPIC  TROPONIN I (HIGH SENSITIVITY)    EKG None  Radiology CT ABDOMEN PELVIS WO CONTRAST Result Date: 04/19/2023 CLINICAL DATA:  Constipation. No nausea or vomiting. Dizziness. Bowel obstruction suspected EXAM: CT ABDOMEN AND PELVIS WITHOUT CONTRAST TECHNIQUE: Multidetector CT imaging of the abdomen and pelvis was performed following the standard protocol without IV contrast. RADIATION DOSE REDUCTION: This exam was performed according to the departmental dose-optimization program which includes automated exposure control, adjustment of the mA and/or kV according to patient size and/or use of iterative  reconstruction technique. COMPARISON:  CT scan abdomen and pelvis from 06/12/2019. FINDINGS: Lower chest: There are patchy atelectatic changes in the visualized lung bases. No overt consolidation. No pleural effusion. The heart is normal in size. No pericardial effusion. Hepatobiliary: The liver is normal in size. There is subtle liver surface irregularity/nodularity, concerning for underlying liver parenchymal disease. Correlate clinically. No suspicious mass. No intrahepatic or extrahepatic bile duct dilation. Partially contracted gallbladder containing small-to-moderate volume calcified gallstones without imaging signs of acute cholecystitis. Normal gallbladder wall thickness. No pericholecystic inflammatory changes. Pancreas: Unremarkable. No pancreatic ductal dilatation  or surrounding inflammatory changes. Spleen: Within normal limits. No focal lesion. Adrenals/Urinary Tract: Adrenal glands are unremarkable. No suspicious renal mass seen within the limitations of this unenhanced exam. There are at least 6, 4 mm or smaller sized nonobstructing calculi in the right kidney and at least 3, 2 mm or smaller sized calculi in the left kidney. No hydroureteronephrosis or ureterolithiasis. Urinary bladder is under distended, precluding optimal assessment. However, no large mass or stones identified. No perivesical fat stranding. Stomach/Bowel: There is a small sliding hiatal hernia. No disproportionate dilation of the small or large bowel loops. No evidence of abnormal bowel wall thickening or inflammatory changes. The appendix is unremarkable. There are scattered diverticula mainly in the left hemi colon, without imaging signs of diverticulitis. Small to moderate stool burden. Vascular/Lymphatic: No ascites or pneumoperitoneum. No abdominal or pelvic lymphadenopathy, by size criteria. No aneurysmal dilation of the major abdominal arteries. There are moderate peripheral atherosclerotic vascular calcifications of the  aorta and its major branches. Reproductive: Patient is status post surgical removal of prostate gland. Other: There are fat containing umbilical and bilateral inguinal hernias. The soft tissues and abdominal wall are otherwise unremarkable. Musculoskeletal: No suspicious osseous lesions. There are mild multilevel degenerative changes in the visualized spine. IMPRESSION: 1. No acute inflammatory process identified within the abdomen or pelvis. No bowel obstruction. 2. There is subtle liver surface irregularity/nodularity, concerning for underlying liver parenchymal disease. Correlate clinically. 3. Multiple other nonacute observations (including cholelithiasis without acute cholecystitis, bilateral nonobstructing nephrolithiasis, surgically absent prostate gland, fat containing umbilical and bilateral inguinal hernias, etc.) As described above. Electronically Signed   By: Jules Schick M.D.   On: 04/19/2023 09:28   DG Chest Portable 1 View Result Date: 04/19/2023 CLINICAL DATA:  Dizziness with near syncope. EXAM: PORTABLE CHEST 1 VIEW COMPARISON:  Chest radiograph dated June 23, 2019. FINDINGS: The heart size and mediastinal contours are within normal limits. Both lungs are clear. No sizable pleural effusion or pneumothorax. No acute osseous abnormality. IMPRESSION: No acute cardiopulmonary findings. Electronically Signed   By: Hart Robinsons M.D.   On: 04/19/2023 09:08    Procedures Procedures    Medications Ordered in ED Medications  lactated ringers bolus 1,000 mL (1,000 mLs Intravenous New Bag/Given 04/19/23 0857)    ED Course/ Medical Decision Making/ A&P                                 Medical Decision Making Amount and/or Complexity of Data Reviewed Labs: ordered. Radiology: ordered.   This patient presents to the ED for concern of near syncope, this involves an extensive number of treatment options, and is a complaint that carries with it a high risk of complications and  morbidity.  The differential diagnosis includes dehydration, anemia, metabolic derangements, infection, arrhythmia, polypharmacy   Co morbidities that complicate the patient evaluation  DM, bipolar disorder, HTN, prostate cancer   Additional history obtained:  Additional history obtained from patient's wife External records from outside source obtained and reviewed including MR   Lab Tests:  I Ordered, and personally interpreted labs.  The pertinent results include: Normal hemoglobin, no leukocytosis, baseline creatinine, slightly increased calcium with otherwise normal electrolytes, baseline mild elevations in transaminases, normal troponin   Imaging Studies ordered:  I ordered imaging studies including chest x-ray, CT of abdomen and pelvis I independently visualized and interpreted imaging which showed no acute findings.  Cholelithiasis is present.  There is small  to moderate stool burden. I agree with the radiologist interpretation   Cardiac Monitoring: / EKG:  The patient was maintained on a cardiac monitor.  I personally viewed and interpreted the cardiac monitored which showed an underlying rhythm of: Sinus rhythm   Problem List / ED Course / Critical interventions / Medication management  Patient presents for constipation and near syncopal symptoms.  On arrival in the ED, he is well-appearing.  At rest, he denies any current symptoms other than some mild bloating sensation in his abdomen.  His abdomen is soft and nontender.  He has no focal neurologic deficits.  When in recumbent position, blood pressure is 150s over 70s.  When stood up, blood pressure dropped to 80s over 50s.  Despite this, he denied any lightheadedness episode with standing.  IV fluids were ordered.  Laboratory workup was initiated.  Lab work was reassuring.  Patient underwent CT imaging of abdomen and pelvis.  There was small to moderate stool burden.  Patient was advised to take MiraLAX as needed.  He does  have this at home already.  Following IV fluids, patient's orthostatic hypotension improved.  Now with standing, blood pressures in the 130s over 70s.  He does not have any near syncopal symptoms with standing.  I suspect dehydration.  Although patient does drink water, he has a decreased food intake.  He was encouraged to increase fluid intake and to stay hydrated at home.  Patient to follow-up with his outpatient providers.  He was discharged in stable condition. I ordered medication including IV fluids for hydration Reevaluation of the patient after these medicines showed that the patient improved I have reviewed the patients home medicines and have made adjustments as needed   Social Determinants of Health:  Has access to outpatient care        Final Clinical Impression(s) / ED Diagnoses Final diagnoses:  Near syncope  Dehydration    Rx / DC Orders ED Discharge Orders     None         Gloris Manchester, MD 04/19/23 [phone]

## 2023-04-19 NOTE — Discharge Instructions (Addendum)
Test results today are reassuring.  You have only a small to moderate stool burden in your large intestine.  You can take several capfuls of MiraLAX to help promote bowel movement.  I suspect that your lightheadedness has been due to dehydration.  Ensure that you stay hydrated at home by continuing to drink plenty of fluids and increasing your intake of food as well.  You have gallstones which can at times cause discomfort after eating.  If you would like to talk to a surgeon about possible gallbladder removal, call the telephone number below.  Follow-up with your outpatient doctors.  Return to the emergency department for any new or worsening symptoms of concern.

## 2023-04-19 NOTE — ED Triage Notes (Signed)
Pt coming from home with complaints of constipation that happens every time patient eats. Denies nausea or vomiting. Patient also complains of dizziness for about a month with near syncope at times.

## 2023-05-12 ENCOUNTER — Encounter (HOSPITAL_COMMUNITY): Payer: Self-pay

## 2023-05-12 ENCOUNTER — Emergency Department (HOSPITAL_COMMUNITY)
Admission: EM | Admit: 2023-05-12 | Discharge: 2023-05-12 | Disposition: A | Discharge status: Home / Self Care | Payer: 59 | Attending: Student | Admitting: Student

## 2023-05-12 ENCOUNTER — Other Ambulatory Visit: Payer: Self-pay

## 2023-05-12 ENCOUNTER — Emergency Department (HOSPITAL_COMMUNITY): Payer: 59

## 2023-05-12 DIAGNOSIS — I10 Essential (primary) hypertension: Secondary | ICD-10-CM | POA: Insufficient documentation

## 2023-05-12 DIAGNOSIS — S0285XA Fracture of orbit, unspecified, initial encounter for closed fracture: Secondary | ICD-10-CM

## 2023-05-12 DIAGNOSIS — W06XXXA Fall from bed, initial encounter: Secondary | ICD-10-CM | POA: Diagnosis not present

## 2023-05-12 DIAGNOSIS — S02832A Fracture of medial orbital wall, left side, initial encounter for closed fracture: Secondary | ICD-10-CM | POA: Insufficient documentation

## 2023-05-12 DIAGNOSIS — E119 Type 2 diabetes mellitus without complications: Secondary | ICD-10-CM | POA: Diagnosis not present

## 2023-05-12 DIAGNOSIS — Z8546 Personal history of malignant neoplasm of prostate: Secondary | ICD-10-CM | POA: Diagnosis not present

## 2023-05-12 DIAGNOSIS — Z7984 Long term (current) use of oral hypoglycemic drugs: Secondary | ICD-10-CM | POA: Diagnosis not present

## 2023-05-12 DIAGNOSIS — H532 Diplopia: Secondary | ICD-10-CM | POA: Diagnosis not present

## 2023-05-12 DIAGNOSIS — S0592XA Unspecified injury of left eye and orbit, initial encounter: Secondary | ICD-10-CM | POA: Diagnosis present

## 2023-05-12 MED ORDER — FLUORESCEIN SODIUM 1 MG OP STRP
1.0000 | ORAL_STRIP | Freq: Once | OPHTHALMIC | Status: AC
  Administered 2023-05-12: 1 via OPHTHALMIC
  Filled 2023-05-12: qty 1

## 2023-05-12 MED ORDER — TETRACAINE HCL 0.5 % OP SOLN
1.0000 [drp] | Freq: Once | OPHTHALMIC | Status: AC
  Administered 2023-05-12: 1 [drp] via OPHTHALMIC
  Filled 2023-05-12: qty 4

## 2023-05-12 NOTE — ED Triage Notes (Signed)
 Pt states his sugar dropped and he fell, states his left eye hit the bed post and he has been having blurry vision.

## 2023-05-12 NOTE — ED Provider Notes (Signed)
 [place] EMERGENCY DEPARTMENT AT [facility] Provider Note  CSN: [id number] Arrival date & time: 05/12/23 1109  Chief Complaint(s) Eye Injury  HPI RYLAND TUNGATE is a 63 y.o. male with PMH bipolar 1, T2DM, prostate cancer, glaucoma who presents emergency department for evaluation of an eye injury.  Patient states that he stood up too quickly out of bed today and fell, striking his head and left eye against the side of the bedpost.  He states that he has some pain in the orbit on the left and is experiencing diplopia.  States that vision is clear when 1 eye is covered but sees double vision when both are exposed.  Denies loss of consciousness, nausea, vomiting, chest pain, shortness of breath, numbness, tingling, weakness or other systemic, traumatic or neurologic complaints.  Surprisingly, patient is complaining of no pain in the face.   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 Authorizing Provider  acetaminophen  (TYLENOL ) 325 MG tablet Take 2 tablets (650 mg total) by mouth every 6 (six) hours as needed for mild pain, fever or headache. 06/29/19    Elgergawy, Brayton RAMAN, MD  atorvastatin  (LIPITOR) 20 MG tablet Take 20 mg by mouth daily.  02/24/17   [provider]  feeding supplement, ENSURE ENLIVE, (ENSURE ENLIVE) LIQD Take 237 mLs by mouth 2 (two) times daily between meals. 06/30/19   Elgergawy, Brayton RAMAN, MD  folic acid  (FOLVITE ) 1 MG tablet Take 1 tablet (1 mg total) by mouth daily. 06/30/19   Elgergawy, Brayton RAMAN, MD  metFORMIN  (GLUCOPHAGE ) 500 MG tablet Take 500 mg by mouth daily with breakfast.  02/24/17   [provider]  multivitamin-iron-minerals-folic acid  (CENTRUM) chewable tablet Chew 1 tablet by mouth daily.    [provider]  polyethylene glycol (MIRALAX  / GLYCOLAX ) 17 g packet Take 17 g by mouth daily.    [provider]  senna-docusate (SENOKOT-S) 8.6-50 MG tablet Take 1 tablet by mouth 2 (two) times daily. While taking pain meds to prevent constipation 06/17/17   Alvaro Ricardo KATHEE Raddle., MD  sildenafil  (VIAGRA ) 100 MG tablet Take 1 tablet (100 mg total) by mouth daily as needed for erectile dysfunction. 09/12/19   Matilda Senior, MD  thiamine  100 MG tablet Take 1 tablet (100 mg total) by mouth daily. 06/30/19   Elgergawy, Brayton RAMAN, MD  Past Surgical History Past Surgical History:  Procedure Laterality Date   LYMPHADENECTOMY Bilateral 06/16/2017   Procedure: LYMPHADENECTOMY;  Surgeon: Alvaro Hummer, MD;  Location: [facility];  Service: Urology;  Laterality: Bilateral;   PROSTATE BIOPSY     ROBOT ASSISTED LAPAROSCOPIC RADICAL PROSTATECTOMY N/A 06/16/2017   Procedure: ROBOTIC ASSISTED LAPAROSCOPIC RADICAL PROSTATECTOMY;  Surgeon: Alvaro Hummer, MD;  Location: [facility];  Service: Urology;  Laterality: N/A;   Family History Family History  Problem Relation Age of Onset   Cancer Mother        GYN problems    Social History Social History   Tobacco Use   Smoking  status: Never   Smokeless tobacco: Never  Vaping Use   Vaping status: Never Used  Substance Use Topics   Alcohol use: No   Drug use: No   Allergies Clotrimazole 3 [clotrimazole]  Review of Systems Review of Systems  Eyes:  Positive for visual disturbance.    Physical Exam Vital Signs  I have reviewed the triage vital signs BP (!) 106/53   Pulse 72   Temp 97.8 F (36.6 C) (Oral)   Resp 17   Ht 5' 6 (1.676 m)   Wt 70.3 kg   SpO2 100%   BMI 25.02 kg/m   Physical Exam Constitutional:      General: He is not in acute distress.    Appearance: Normal appearance.  HENT:     Head: Normocephalic.     Comments: Tenderness at the orbit on the left    Nose: No congestion or rhinorrhea.  Eyes:     General:        Right eye: No discharge.        Left eye: No discharge.     Extraocular Movements: Extraocular movements intact.     Pupils: Pupils are equal, round, and reactive to light.     Comments: Left-sided conjunctival injection  Cardiovascular:     Rate and Rhythm: Normal rate and regular rhythm.     Heart sounds: No murmur heard. Pulmonary:     Effort: No respiratory distress.     Breath sounds: No wheezing or rales.  Abdominal:     General: There is no distension.     Tenderness: There is no abdominal tenderness.  Musculoskeletal:        General: Normal range of motion.     Cervical back: Normal range of motion.  Skin:    General: Skin is warm and dry.  Neurological:     General: No focal deficit present.     Mental Status: He is alert.     ED Results and Treatments Labs (all labs ordered are listed, but only abnormal results are displayed) Labs Reviewed - No data to display                                                                                                                        Radiology CT Head Wo Contrast Result Date: 05/12/2023 CLINICAL  DATA:  Head trauma, moderate-severe; Facial trauma, blunt EXAM: CT HEAD WITHOUT CONTRAST CT  MAXILLOFACIAL WITHOUT CONTRAST TECHNIQUE: Multidetector CT imaging of the head and maxillofacial structures were performed using the standard protocol without intravenous contrast. Multiplanar CT image reconstructions of the maxillofacial structures were also generated. RADIATION DOSE REDUCTION: This exam was performed according to the departmental dose-optimization program which includes automated exposure control, adjustment of the mA and/or kV according to patient size and/or use of iterative reconstruction technique. COMPARISON:  MRI head February 20, 21. CT head June 17, 2019. FINDINGS: CT HEAD FINDINGS Brain: No evidence of acute infarction, hemorrhage, hydrocephalus, extra-axial collection or mass lesion/mass effect. Vascular: No hyperdense vessel identified. Calcific atherosclerosis. Skull: No acute fracture. Other: No mastoid effusions. CT MAXILLOFACIAL FINDINGS Osseous: Left medial orbital wall fracture is detailed below. No other fracture identified. TMJs are located. Orbits: Acute left medial orbital wall fracture with herniation of fat through the defect and small amount of retro bulbar hemorrhage at the site of fracture. The medial rectus abuts the defect without definite herniation. The globes, optic nerves and lacrimal glands are unremarkable. No mass. Sinuses: Mostly clear.  Right maxillary sinus retention cyst. Soft tissues: Negative. IMPRESSION: 1. Acute left medial orbital wall fracture with herniation of fat through the defect and small amount of retro bulbar hemorrhage at the site of fracture. The medial rectus abuts the defect without definite herniation. Correlate for the presence or absence of signs/symptoms of entrapment. 2. No evidence of acute intracranial abnormality. Electronically Signed   By: Gilmore GORMAN Molt M.D.   On: 05/12/2023 13:23   CT Maxillofacial Wo Contrast Result Date: 05/12/2023 CLINICAL DATA:  Head trauma, moderate-severe; Facial trauma, blunt EXAM: CT HEAD WITHOUT  CONTRAST CT MAXILLOFACIAL WITHOUT CONTRAST TECHNIQUE: Multidetector CT imaging of the head and maxillofacial structures were performed using the standard protocol without intravenous contrast. Multiplanar CT image reconstructions of the maxillofacial structures were also generated. RADIATION DOSE REDUCTION: This exam was performed according to the departmental dose-optimization program which includes automated exposure control, adjustment of the mA and/or kV according to patient size and/or use of iterative reconstruction technique. COMPARISON:  MRI head February 20, 21. CT head June 17, 2019. FINDINGS: CT HEAD FINDINGS Brain: No evidence of acute infarction, hemorrhage, hydrocephalus, extra-axial collection or mass lesion/mass effect. Vascular: No hyperdense vessel identified. Calcific atherosclerosis. Skull: No acute fracture. Other: No mastoid effusions. CT MAXILLOFACIAL FINDINGS Osseous: Left medial orbital wall fracture is detailed below. No other fracture identified. TMJs are located. Orbits: Acute left medial orbital wall fracture with herniation of fat through the defect and small amount of retro bulbar hemorrhage at the site of fracture. The medial rectus abuts the defect without definite herniation. The globes, optic nerves and lacrimal glands are unremarkable. No mass. Sinuses: Mostly clear.  Right maxillary sinus retention cyst. Soft tissues: Negative. IMPRESSION: 1. Acute left medial orbital wall fracture with herniation of fat through the defect and small amount of retro bulbar hemorrhage at the site of fracture. The medial rectus abuts the defect without definite herniation. Correlate for the presence or absence of signs/symptoms of entrapment. 2. No evidence of acute intracranial abnormality. Electronically Signed   By: Gilmore GORMAN Molt M.D.   On: 05/12/2023 13:23    Pertinent labs & imaging results that were available during my care of the patient were reviewed by me and considered in my  medical decision making (see MDM for details).  Medications Ordered in ED Medications  fluorescein  ophthalmic strip 1 strip (1 strip Both  Eyes Given 05/12/23 1219)  tetracaine  (PONTOCAINE) 0.5 % ophthalmic solution 1 drop (1 drop Left Eye Given 05/12/23 1219)                                                                                                                                     Procedures Procedures  (including critical care time)  Medical Decision Making / ED Course   This patient presents to the ED for concern of fall, diplopia, this involves an extensive number of treatment options, and is a complaint that carries with it a high risk of complications and morbidity.  The differential diagnosis includes fracture, hematoma, contusion, traumatic mydriasis, traumatic iritis, corneal abrasion  MDM: Patient seen emergency room for evaluation of a fall with diplopia.  Physical exam with fully intact extraocular movements and no sign of muscle entrapment.  Conjunctival injection on the left but fluorescein  exam is unremarkable.  Pressures 21 in the eye via Tono-Pen.  CT imaging of the head and maxillofacial revealing a left medial orbital wall fracture with fat herniation and a very small amount of retrobulbar hemorrhage at the site of the fracture.  Medial rectus abuts the injury.  Spoke to the ENT on-call Dr. Roark who states that patient would likely not require a repair and even if he were to require repair would not do so for another week to let swelling come down.  I also spoke with the ophthalmologist on-call Dr. Austin who will agree to see the patient urgently in her office this week.  With normal ocular pressures and no vision loss in the I have low suspicion for retrobulbar hematoma causing compartment syndrome.  Patient states he will call the ophthalmologist to set up urgent follow-up.  He was given very strict return precautions of which he voiced understanding and was discharged with  outpatient ophthalmology follow-up.   Additional history obtained: -Additional history obtained from spouse -External records from outside source obtained and reviewed including: Chart review including previous notes, labs, imaging, consultation notes    Imaging Studies ordered: I ordered imaging studies including CT head, max face I independently visualized and interpreted imaging. I agree with the radiologist interpretation   Medicines ordered and prescription drug management: Meds ordered this encounter  Medications   fluorescein  ophthalmic strip 1 strip   tetracaine  (PONTOCAINE) 0.5 % ophthalmic solution 1 drop    -I have reviewed the patients home medicines and have made adjustments as needed  Critical interventions none  Consultations Obtained: I requested consultation with the ENT on-call and ophthalmologist on-call,  and discussed lab and imaging findings as well as pertinent plan - they recommend: Outpatient follow-up   Cardiac Monitoring: The patient was maintained on a cardiac monitor.  I personally viewed and interpreted the cardiac monitored which showed an underlying rhythm of: NSR  Social Determinants of Health:  Factors impacting patients care include: none   Reevaluation: After the interventions noted above,  I reevaluated the patient and found that they have :stayed the same  Co morbidities that complicate the patient evaluation  Past Medical History:  Diagnosis Date   Bipolar 1 disorder (HCC)    NO CURRENT MEDS   Diabetes mellitus without complication (HCC)    type 2    Elevated cholesterol    Glaucoma    LEFT EYE   Hypertension    Prostate cancer (HCC)       Dispostion: I considered admission for this patient, but at this time he does not meet inpatient criteria for admission and will follow-up urgently outpatient with ophthalmology     Final Clinical Impression(s) / ED Diagnoses Final diagnoses:  Closed fracture of orbit, initial  encounter [facility])  Diplopia     @PCDICTATION @    Albertina Dixon, MD 05/12/23 1737

## 2023-10-05 ENCOUNTER — Inpatient Hospital Stay: Payer: 59 | Attending: Hematology

## 2023-10-05 DIAGNOSIS — I129 Hypertensive chronic kidney disease with stage 1 through stage 4 chronic kidney disease, or unspecified chronic kidney disease: Secondary | ICD-10-CM | POA: Insufficient documentation

## 2023-10-05 DIAGNOSIS — E1122 Type 2 diabetes mellitus with diabetic chronic kidney disease: Secondary | ICD-10-CM | POA: Insufficient documentation

## 2023-10-05 DIAGNOSIS — N1832 Chronic kidney disease, stage 3b: Secondary | ICD-10-CM | POA: Insufficient documentation

## 2023-10-05 DIAGNOSIS — C9000 Multiple myeloma not having achieved remission: Secondary | ICD-10-CM | POA: Insufficient documentation

## 2023-10-05 DIAGNOSIS — Z79899 Other long term (current) drug therapy: Secondary | ICD-10-CM | POA: Insufficient documentation

## 2023-10-05 DIAGNOSIS — Z7984 Long term (current) use of oral hypoglycemic drugs: Secondary | ICD-10-CM | POA: Insufficient documentation

## 2023-10-08 ENCOUNTER — Other Ambulatory Visit: Payer: Self-pay

## 2023-10-08 DIAGNOSIS — R803 Bence Jones proteinuria: Secondary | ICD-10-CM

## 2023-10-08 NOTE — Addendum Note (Signed)
 Addended by: Lewanda Rebel. on: 10/08/2023 11:07 AM   Modules accepted: Orders

## 2023-10-11 ENCOUNTER — Other Ambulatory Visit: Payer: Self-pay

## 2023-10-11 ENCOUNTER — Inpatient Hospital Stay

## 2023-10-11 DIAGNOSIS — R803 Bence Jones proteinuria: Secondary | ICD-10-CM

## 2023-10-11 DIAGNOSIS — C9000 Multiple myeloma not having achieved remission: Secondary | ICD-10-CM | POA: Diagnosis present

## 2023-10-11 DIAGNOSIS — I129 Hypertensive chronic kidney disease with stage 1 through stage 4 chronic kidney disease, or unspecified chronic kidney disease: Secondary | ICD-10-CM | POA: Diagnosis not present

## 2023-10-11 DIAGNOSIS — N1832 Chronic kidney disease, stage 3b: Secondary | ICD-10-CM | POA: Diagnosis not present

## 2023-10-11 DIAGNOSIS — D472 Monoclonal gammopathy: Secondary | ICD-10-CM

## 2023-10-11 DIAGNOSIS — E1122 Type 2 diabetes mellitus with diabetic chronic kidney disease: Secondary | ICD-10-CM | POA: Diagnosis not present

## 2023-10-11 DIAGNOSIS — Z7984 Long term (current) use of oral hypoglycemic drugs: Secondary | ICD-10-CM | POA: Diagnosis not present

## 2023-10-11 DIAGNOSIS — Z79899 Other long term (current) drug therapy: Secondary | ICD-10-CM | POA: Diagnosis not present

## 2023-10-11 LAB — CBC WITH DIFFERENTIAL/PLATELET
Abs Immature Granulocytes: 0.01 10*3/uL (ref 0.00–0.07)
Basophils Absolute: 0.1 10*3/uL (ref 0.0–0.1)
Basophils Relative: 1 %
Eosinophils Absolute: 0.3 10*3/uL (ref 0.0–0.5)
Eosinophils Relative: 7 %
HCT: 43.3 % (ref 39.0–52.0)
Hemoglobin: 13.8 g/dL (ref 13.0–17.0)
Immature Granulocytes: 0 %
Lymphocytes Relative: 33 %
Lymphs Abs: 1.5 10*3/uL (ref 0.7–4.0)
MCH: 27.2 pg (ref 26.0–34.0)
MCHC: 31.9 g/dL (ref 30.0–36.0)
MCV: 85.2 fL (ref 80.0–100.0)
Monocytes Absolute: 0.3 10*3/uL (ref 0.1–1.0)
Monocytes Relative: 6 %
Neutro Abs: 2.5 10*3/uL (ref 1.7–7.7)
Neutrophils Relative %: 53 %
Platelets: 189 10*3/uL (ref 150–400)
RBC: 5.08 MIL/uL (ref 4.22–5.81)
RDW: 13.5 % (ref 11.5–15.5)
WBC: 4.7 10*3/uL (ref 4.0–10.5)
nRBC: 0 % (ref 0.0–0.2)

## 2023-10-11 LAB — COMPREHENSIVE METABOLIC PANEL WITH GFR
ALT: 21 U/L (ref 0–44)
AST: 26 U/L (ref 15–41)
Albumin: 4.3 g/dL (ref 3.5–5.0)
Alkaline Phosphatase: 106 U/L (ref 38–126)
Anion gap: 9 (ref 5–15)
BUN: 25 mg/dL — ABNORMAL HIGH (ref 8–23)
CO2: 26 mmol/L (ref 22–32)
Calcium: 9.6 mg/dL (ref 8.9–10.3)
Chloride: 104 mmol/L (ref 98–111)
Creatinine, Ser: 2.11 mg/dL — ABNORMAL HIGH (ref 0.61–1.24)
GFR, Estimated: 34 mL/min — ABNORMAL LOW (ref >60)
Glucose, Bld: 108 mg/dL — ABNORMAL HIGH (ref 70–99)
Potassium: 4.5 mmol/L (ref 3.5–5.1)
Sodium: 139 mmol/L (ref 135–145)
Total Bilirubin: 0.9 mg/dL (ref 0.0–1.2)
Total Protein: 7.7 g/dL (ref 6.5–8.1)

## 2023-10-11 LAB — LACTATE DEHYDROGENASE: LDH: 123 U/L (ref 98–192)

## 2023-10-12 ENCOUNTER — Inpatient Hospital Stay: Payer: 59 | Admitting: Hematology

## 2023-10-12 LAB — KAPPA/LAMBDA LIGHT CHAINS
Kappa free light chain: 24.1 mg/L — ABNORMAL HIGH (ref 3.3–19.4)
Kappa, lambda light chain ratio: 1.1 (ref 0.26–1.65)
Lambda free light chains: 22 mg/L (ref 5.7–26.3)

## 2023-10-12 LAB — PROTEIN ELECTROPHORESIS, SERUM
A/G Ratio: 1.4 (ref 0.7–1.7)
Albumin ELP: 4 g/dL (ref 2.9–4.4)
Alpha-1-Globulin: 0.2 g/dL (ref 0.0–0.4)
Alpha-2-Globulin: 0.7 g/dL (ref 0.4–1.0)
Beta Globulin: 1 g/dL (ref 0.7–1.3)
Gamma Globulin: 1 g/dL (ref 0.4–1.8)
Globulin, Total: 2.9 g/dL (ref 2.2–3.9)
Total Protein ELP: 6.9 g/dL (ref 6.0–8.5)

## 2023-10-12 LAB — BETA 2 MICROGLOBULIN, SERUM: Beta-2 Microglobulin: 2.7 mg/L — ABNORMAL HIGH (ref 0.6–2.4)

## 2023-10-13 LAB — IMMUNOFIXATION ELECTROPHORESIS
IgA: 163 mg/dL (ref 61–437)
IgG (Immunoglobin G), Serum: 1053 mg/dL (ref 603–1613)
IgM (Immunoglobulin M), Srm: 72 mg/dL (ref 20–172)
Total Protein ELP: 7 g/dL (ref 6.0–8.5)

## 2023-10-14 LAB — UIFE/LIGHT CHAINS/TP QN, 24-HR UR
FR KAPPA LT CH,24HR: 54.73 mg/(24.h)
FR LAMBDA LT CH,24HR: 20.83 mg/(24.h)
Free Kappa Lt Chains,Ur: 26.06 mg/L (ref 1.17–86.46)
Free Kappa/Lambda Ratio: 2.63 (ref 1.83–14.26)
Free Lambda Lt Chains,Ur: 9.92 mg/L (ref 0.27–15.21)
Total Protein, Urine-Ur/day: 206 mg/(24.h) — ABNORMAL HIGH (ref 30–150)
Total Protein, Urine: 9.8 mg/dL
Total Volume: 2100

## 2023-10-18 ENCOUNTER — Ambulatory Visit (HOSPITAL_COMMUNITY)
Admission: RE | Admit: 2023-10-18 | Discharge: 2023-10-18 | Disposition: A | Discharge status: Home / Self Care | Source: Ambulatory Visit | Attending: Hematology | Admitting: Hematology

## 2023-10-18 ENCOUNTER — Inpatient Hospital Stay (HOSPITAL_BASED_OUTPATIENT_CLINIC_OR_DEPARTMENT_OTHER): Admitting: Hematology

## 2023-10-18 VITALS — BP 166/76 | HR 64 | Temp 97.4°F | Resp 16 | Wt 158.1 lb

## 2023-10-18 DIAGNOSIS — C9000 Multiple myeloma not having achieved remission: Secondary | ICD-10-CM | POA: Diagnosis not present

## 2023-10-18 DIAGNOSIS — D472 Monoclonal gammopathy: Secondary | ICD-10-CM

## 2023-10-18 NOTE — Patient Instructions (Addendum)

## 2023-10-18 NOTE — Progress Notes (Signed)
 [facility] [address] [place] [address]   Clinic Day:  10/18/2023  Referring physician: [facility]  Patient Care Team: [facility] as PCP - General   ASSESSMENT & PLAN:   Assessment:  1.  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 Carrolyn Clan - CKD stage IIIb since 2018, secondary to diabetes/hypertension.  Nonnephrotic range proteinuria. - 01/21/2023: SPEP-no M spike, immunofixation-unremarkable, kappa light chains 47.3 (3.3-19.4), lambda light chains 32.8, ratio 1.44, Hb-14.5, creatinine 2.26, calcium  9.6, albumin  4.8 - 24-hour urine (01/05/2023): M spike not observed, protein 252 mg / 24-hour, urine immunofixation positive for Bence-Jones proteins, lambda type - Has constant numbness in the feet for the past 2 years, thought to be from diabetes. - No B symptoms or new pains.  2. Social/family history: - Lives at home with his wife.  Retired after working at [employer].  No exposure to chemicals or pesticides.  Non-smoker. - Mother had uterine cancer.  No family history of plasma cell myeloma.  Plan:  1.  Lambda type Bence-Jones proteinuria: - He denies any fevers/infections in the last 6 months.  No new pains. - Reviewed labs from 10/11/2023: Creatinine at 2.11 stable.  Calcium  is normal at 9.6.  M spike is negative.  CBC was normal.  Kappa free light chain is elevated at 24.1, ratio is normal at 1.10.  Lambda light chains 22.  Immunofixation is normal. - 24-hour urine: Total protein is 206 mg.  Urine immunofixation was unremarkable. - His urine previously showed Bence-Jones proteins, lambda type which was not seen this time. - He will have skeletal survey done today.  RTC 6 months with repeat myeloma labs. - If at next visit, urine Bence-Jones proteins are not seen, we may switch his visit to once a year.   Orders Placed This Encounter  Procedures   24  hr, Ur UPEP/UIFE/Light Chains/TP    Standing Status:   Future    Expected Date:   04/17/2024    Expiration Date:   07/16/2024   Immunofixation electrophoresis    Standing Status:   Future    Expected Date:   04/17/2024    Expiration Date:   07/16/2024   Beta 2 microglobuline, serum    Standing Status:   Future    Expected Date:   04/17/2024    Expiration Date:   07/16/2024   Kappa/lambda light chains    Standing Status:   Future    Expected Date:   04/17/2024    Expiration Date:   07/16/2024   Protein electrophoresis, serum    Standing Status:   Future    Expected Date:   04/17/2024    Expiration Date:   07/16/2024   CBC with Differential    Standing Status:   Future    Expected Date:   04/17/2024    Expiration Date:   07/16/2024   Comprehensive metabolic panel    Standing Status:   Future    Expected Date:   04/17/2024    Expiration Date:   07/16/2024   Lactate dehydrogenase    Standing Status:   Future    Expected Date:   04/17/2024    Expiration Date:   07/16/2024      Scott Mcgee,acting as a scribe for Paulett Boros, MD.,have documented all relevant documentation on the behalf of Paulett Boros, MD,as directed by  Paulett Boros, MD while in the presence  of Paulett Boros, MD.  I, Paulett Boros MD, have reviewed the above documentation for accuracy and completeness, and I agree with the above.    Paulett Boros, MD   6/16/20258:29 AM  CHIEF COMPLAINT/PURPOSE OF CONSULT:   Diagnosis: Bence-Jones proteinuria  Current Therapy: Under workup  HISTORY OF PRESENT ILLNESS:   Scott Mcgee is a 63 y.o. male presenting to clinic today for evaluation of Bence-Jones proteinuria at the request of Dr. Carrolyn Clan.  He has a history of stage 3b CKD, initially diagnosed in 2018. He recently submitted a 24 hour urine collection on 01/05/23. The results revealed a calculated urine protein of 252. Additionally, immunofixation result was also abnormal,  revealing Bence Jones protein positivity, lambda type.  Today, he states that he is doing well overall. His appetite level is at 100%. His energy level is at 100%.   INTERVAL HISTORY:   Scott Mcgee is a 63 y.o. male presenting to the clinic today for follow-up of  Lambda type Bence-Jones proteinuria. He was last seen by me on 04/07/23.  Today, he states that he is doing well overall. His appetite level is at 100%. His energy level is at 100%.   PAST MEDICAL HISTORY:   Past Medical History: Past Medical History:  Diagnosis Date   Bipolar 1 disorder (HCC)    NO CURRENT MEDS   Diabetes mellitus without complication (HCC)    type 2    Elevated cholesterol    Glaucoma    LEFT EYE   Hypertension    Prostate cancer [facility])     Surgical History: Past Surgical History:  Procedure Laterality Date   LYMPHADENECTOMY Bilateral 06/16/2017   Procedure: LYMPHADENECTOMY;  Surgeon: Osborn Blaze, MD;  Location: [facility];  Service: Urology;  Laterality: Bilateral;   PROSTATE BIOPSY     ROBOT ASSISTED LAPAROSCOPIC RADICAL PROSTATECTOMY N/A 06/16/2017   Procedure: ROBOTIC ASSISTED LAPAROSCOPIC RADICAL PROSTATECTOMY;  Surgeon: Osborn Blaze, MD;  Location: [facility];  Service: Urology;  Laterality: N/A;    Social History: Social History   Socioeconomic History   Marital status: Married    Spouse name: Eveleen Hinds    Number of children: 2   Years of education: Not on file   Highest education level: Not on file  Occupational History   Not on file  Tobacco Use   Smoking status: Never   Smokeless tobacco: Never  Vaping Use   Vaping status: Never Used  Substance and Sexual Activity   Alcohol use: No   Drug use: No   Sexual activity: Not on file  Other Topics Concern   Not on file  Social History Narrative   Not on file   Social Drivers of Health   Financial Resource Strain: Not on file  Food Insecurity: No Food Insecurity (04/07/2023)   Hunger Vital Sign    Worried About Running Out  of Food in the Last Year: Never true    Ran Out of Food in the Last Year: Never true  Transportation Needs: No Transportation Needs (04/07/2023)   PRAPARE - Administrator, Civil Service (Medical): No    Lack of Transportation (Non-Medical): No  Physical Activity: Not on file  Stress: Not on file  Social Connections: Not on file  Intimate Partner Violence: Not At Risk (04/07/2023)   Humiliation, Afraid, Rape, and Kick questionnaire    Fear of Current or Ex-Partner: No    Emotionally Abused: No    Physically Abused: No    Sexually Abused: No  Family History: Family History  Problem Relation Age of Onset   Cancer Mother        GYN problems    Current Medications:  Current Outpatient Medications:    acetaminophen  (TYLENOL ) 325 MG tablet, Take 2 tablets (650 mg total) by mouth every 6 (six) hours as needed for mild pain, fever or headache., Disp:  , Rfl:    atorvastatin  (LIPITOR) 20 MG tablet, Take 20 mg by mouth daily. , Disp: , Rfl:    feeding supplement, ENSURE ENLIVE, (ENSURE ENLIVE) LIQD, Take 237 mLs by mouth 2 (two) times daily between meals., Disp: 237 mL, Rfl: 12   folic acid  (FOLVITE ) 1 MG tablet, Take 1 tablet (1 mg total) by mouth daily., Disp: 30 tablet, Rfl: 0   lisinopril (ZESTRIL) 2.5 MG tablet, Take 2.5 mg by mouth daily., Disp: , Rfl:    metFORMIN  (GLUCOPHAGE ) 500 MG tablet, Take 500 mg by mouth daily with breakfast. , Disp: , Rfl:    multivitamin-iron-minerals-folic acid  (CENTRUM) chewable tablet, Chew 1 tablet by mouth daily., Disp: , Rfl:    polyethylene glycol (MIRALAX  / GLYCOLAX ) 17 g packet, Take 17 g by mouth daily., Disp: , Rfl:    senna-docusate (SENOKOT-S) 8.6-50 MG tablet, Take 1 tablet by mouth 2 (two) times daily. While taking pain meds to prevent constipation, Disp: 20 tablet, Rfl: 0   sildenafil  (VIAGRA ) 100 MG tablet, Take 1 tablet (100 mg total) by mouth daily as needed for erectile dysfunction., Disp: 10 tablet, Rfl: prn   thiamine  100  MG tablet, Take 1 tablet (100 mg total) by mouth daily., Disp: 30 tablet, Rfl: 0   Allergies: Allergies  Allergen Reactions   Clotrimazole 3 [Clotrimazole] Hives    cream    REVIEW OF SYSTEMS:   Review of Systems  Constitutional:  Negative for chills, fatigue and fever.  HENT:   Negative for lump/mass, mouth sores, nosebleeds, sore throat and trouble swallowing.   Eyes:  Negative for eye problems.  Respiratory:  Negative for cough and shortness of breath.   Cardiovascular:  Negative for chest pain, leg swelling and palpitations.  Gastrointestinal:  Positive for constipation. Negative for abdominal pain, diarrhea, nausea and vomiting.  Genitourinary:  Negative for bladder incontinence, difficulty urinating, dysuria, frequency, hematuria and nocturia.   Musculoskeletal:  Negative for arthralgias, back pain, flank pain, myalgias and neck pain.  Skin:  Negative for itching and rash.  Neurological:  Negative for dizziness, headaches and numbness.  Hematological:  Does not bruise/bleed easily.  Psychiatric/Behavioral:  Negative for depression, sleep disturbance and suicidal ideas. The patient is not nervous/anxious.   All other systems reviewed and are negative.    VITALS:   Blood pressure (!) 166/76, pulse 64, temperature (!) 97.4 F (36.3 C), temperature source Oral, resp. rate 16, weight 158 lb 1.1 oz (71.7 kg), SpO2 100%.  Wt Readings from Last 3 Encounters:  10/18/23 158 lb 1.1 oz (71.7 kg)  05/12/23 155 lb (70.3 kg)  04/19/23 160 lb (72.6 kg)    Body mass index is 25.51 kg/m.   PHYSICAL EXAM:   Physical Exam Vitals and nursing note reviewed. Exam conducted with a chaperone present.  Constitutional:      Appearance: Normal appearance.   Cardiovascular:     Rate and Rhythm: Normal rate and regular rhythm.     Pulses: Normal pulses.     Heart sounds: Normal heart sounds.  Pulmonary:     Effort: Pulmonary effort is normal.     Breath sounds: Normal  breath sounds.   Abdominal:     Palpations: Abdomen is soft. There is no hepatomegaly, splenomegaly or mass.     Tenderness: There is no abdominal tenderness.   Musculoskeletal:     Right lower leg: No edema.     Left lower leg: No edema.  Lymphadenopathy:     Cervical: No cervical adenopathy.     Right cervical: No superficial, deep or posterior cervical adenopathy.    Left cervical: No superficial, deep or posterior cervical adenopathy.     Upper Body:     Right upper body: No supraclavicular or axillary adenopathy.     Left upper body: No supraclavicular or axillary adenopathy.   Neurological:     General: No focal deficit present.     Mental Status: He is alert and oriented to person, place, and time.   Psychiatric:        Mood and Affect: Mood normal.        Behavior: Behavior normal.     LABS:      Latest Ref Rng & Units 10/11/2023    8:36 AM 04/19/2023    8:54 AM 01/21/2023    9:02 AM  CBC  WBC 4.0 - 10.5 K/uL 4.7  4.8  4.9   Hemoglobin 13.0 - 17.0 g/dL 16.1  09.6  04.5   Hematocrit 39.0 - 52.0 % 43.3  43.2  42.3   Platelets 150 - 400 K/uL 189  217  239       Latest Ref Rng & Units 10/11/2023    8:36 AM 04/19/2023    8:54 AM 01/21/2023    9:02 AM  CMP  Glucose 70 - 99 mg/dL 409  811  914   BUN 8 - 23 mg/dL 25  26  23    Creatinine 0.61 - 1.24 mg/dL 7.82  9.56  2.13   Sodium 135 - 145 mmol/L 139  135  136   Potassium 3.5 - 5.1 mmol/L 4.5  4.2  4.0   Chloride 98 - 111 mmol/L 104  102  102   CO2 22 - 32 mmol/L 26  23  21    Calcium  8.9 - 10.3 mg/dL 9.6  08.6  9.6   Total Protein 6.5 - 8.1 g/dL 7.7  7.7    Total Bilirubin 0.0 - 1.2 mg/dL 0.9  0.8    Alkaline Phos 38 - 126 U/L 106  141    AST 15 - 41 U/L 26  59    ALT 0 - 44 U/L 21  70       No results found for: CEA1, CEA / No results found for: CEA1, CEA No results found for: PSA1 No results found for: CAN199 No results found for: VHQ469  Lab Results  Component Value Date   TOTALPROTELP 6.9 10/11/2023    TOTALPROTELP 7.0 10/11/2023   ALBUMINELP 4.0 10/11/2023   A1GS 0.2 10/11/2023   A2GS 0.7 10/11/2023   BETS 1.0 10/11/2023   GAMS 1.0 10/11/2023   MSPIKE Not Observed 10/11/2023   SPEI Comment 10/11/2023   Lab Results  Component Value Date   TIBC 406 01/21/2023   TIBC 252 06/22/2019   FERRITIN 111 01/21/2023   FERRITIN 918 (H) 06/22/2019   IRONPCTSAT 22 01/21/2023   IRONPCTSAT 35 06/22/2019   Lab Results  Component Value Date   LDH 123 10/11/2023   LDH 180 06/23/2019     STUDIES:   No results found.

## 2024-04-17 ENCOUNTER — Inpatient Hospital Stay: Attending: General Practice

## 2024-04-24 ENCOUNTER — Inpatient Hospital Stay: Admitting: Physician Assistant
